# Patient Record
Sex: Male | Born: 1935 | Race: White | Hispanic: No | Marital: Married | Smoking: Former smoker
Health system: Southern US, Community
[De-identification: ages and names within clinical notes are randomized; demographics above are authoritative.]

## PROBLEM LIST (undated history)

## (undated) DIAGNOSIS — E291 Testicular hypofunction: Secondary | ICD-10-CM

## (undated) DIAGNOSIS — I10 Essential (primary) hypertension: Secondary | ICD-10-CM

## (undated) DIAGNOSIS — G4730 Sleep apnea, unspecified: Secondary | ICD-10-CM

## (undated) DIAGNOSIS — I739 Peripheral vascular disease, unspecified: Secondary | ICD-10-CM

## (undated) DIAGNOSIS — I270 Primary pulmonary hypertension: Secondary | ICD-10-CM

## (undated) DIAGNOSIS — I2510 Atherosclerotic heart disease of native coronary artery without angina pectoris: Secondary | ICD-10-CM

## (undated) DIAGNOSIS — E785 Hyperlipidemia, unspecified: Secondary | ICD-10-CM

## (undated) DIAGNOSIS — R51 Headache: Secondary | ICD-10-CM

## (undated) DIAGNOSIS — I219 Acute myocardial infarction, unspecified: Secondary | ICD-10-CM

## (undated) DIAGNOSIS — N400 Enlarged prostate without lower urinary tract symptoms: Secondary | ICD-10-CM

## (undated) DIAGNOSIS — I714 Abdominal aortic aneurysm, without rupture, unspecified: Secondary | ICD-10-CM

## (undated) DIAGNOSIS — I723 Aneurysm of iliac artery: Secondary | ICD-10-CM

## (undated) DIAGNOSIS — J8410 Pulmonary fibrosis, unspecified: Secondary | ICD-10-CM

## (undated) DIAGNOSIS — I351 Nonrheumatic aortic (valve) insufficiency: Secondary | ICD-10-CM

## (undated) DIAGNOSIS — I5022 Chronic systolic (congestive) heart failure: Secondary | ICD-10-CM

## (undated) DIAGNOSIS — J449 Chronic obstructive pulmonary disease, unspecified: Secondary | ICD-10-CM

## (undated) DIAGNOSIS — Z8719 Personal history of other diseases of the digestive system: Secondary | ICD-10-CM

## (undated) DIAGNOSIS — Z87442 Personal history of urinary calculi: Secondary | ICD-10-CM

## (undated) DIAGNOSIS — N200 Calculus of kidney: Secondary | ICD-10-CM

## (undated) DIAGNOSIS — H3320 Serous retinal detachment, unspecified eye: Secondary | ICD-10-CM

## (undated) DIAGNOSIS — D3000 Benign neoplasm of unspecified kidney: Secondary | ICD-10-CM

## (undated) DIAGNOSIS — N289 Disorder of kidney and ureter, unspecified: Secondary | ICD-10-CM

## (undated) DIAGNOSIS — R911 Solitary pulmonary nodule: Secondary | ICD-10-CM

## (undated) DIAGNOSIS — K8020 Calculus of gallbladder without cholecystitis without obstruction: Secondary | ICD-10-CM

## (undated) DIAGNOSIS — R519 Headache, unspecified: Secondary | ICD-10-CM

## (undated) HISTORY — DX: Sleep apnea, unspecified: G47.30

## (undated) HISTORY — DX: Headache, unspecified: R51.9

## (undated) HISTORY — DX: Benign prostatic hyperplasia without lower urinary tract symptoms: N40.0

## (undated) HISTORY — DX: Personal history of urinary calculi: Z87.442

## (undated) HISTORY — PX: PTCA: SHX146

## (undated) HISTORY — DX: Primary pulmonary hypertension: I27.0

## (undated) HISTORY — DX: Benign neoplasm of unspecified kidney: D30.00

## (undated) HISTORY — DX: Acute myocardial infarction, unspecified: I21.9

## (undated) HISTORY — DX: Aneurysm of iliac artery: I72.3

## (undated) HISTORY — DX: Essential (primary) hypertension: I10

## (undated) HISTORY — DX: Calculus of kidney: N20.0

## (undated) HISTORY — DX: Headache: R51

## (undated) HISTORY — PX: CATARACT EXTRACTION: SUR2

## (undated) HISTORY — DX: Abdominal aortic aneurysm, without rupture: I71.4

## (undated) HISTORY — DX: Nonrheumatic aortic (valve) insufficiency: I35.1

## (undated) HISTORY — DX: Personal history of other diseases of the digestive system: Z87.19

## (undated) HISTORY — DX: Solitary pulmonary nodule: R91.1

## (undated) HISTORY — PX: VASECTOMY: SHX75

## (undated) HISTORY — PX: ABDOMINAL AORTIC ANEURYSM REPAIR: SUR1152

## (undated) HISTORY — DX: Calculus of gallbladder without cholecystitis without obstruction: K80.20

## (undated) HISTORY — DX: Hyperlipidemia, unspecified: E78.5

## (undated) HISTORY — DX: Disorder of kidney and ureter, unspecified: N28.9

## (undated) HISTORY — DX: Serous retinal detachment, unspecified eye: H33.20

## (undated) HISTORY — DX: Chronic systolic (congestive) heart failure: I50.22

## (undated) HISTORY — DX: Abdominal aortic aneurysm, without rupture, unspecified: I71.40

## (undated) HISTORY — DX: Chronic obstructive pulmonary disease, unspecified: J44.9

## (undated) HISTORY — DX: Atherosclerotic heart disease of native coronary artery without angina pectoris: I25.10

## (undated) HISTORY — DX: Testicular hypofunction: E29.1

## (undated) HISTORY — DX: Peripheral vascular disease, unspecified: I73.9

## (undated) HISTORY — PX: EYE SURGERY: SHX253

## (undated) HISTORY — DX: Pulmonary fibrosis, unspecified: J84.10

---

## 1995-04-24 HISTORY — PX: ABDOMINAL AORTIC ANEURYSM REPAIR: SHX42

## 1998-09-10 ENCOUNTER — Inpatient Hospital Stay (HOSPITAL_COMMUNITY): Admission: EM | Admit: 1998-09-10 | Discharge: 1998-09-14 | Payer: Self-pay | Admitting: Emergency Medicine

## 1998-09-13 ENCOUNTER — Encounter: Payer: Self-pay | Admitting: *Deleted

## 1998-09-14 ENCOUNTER — Encounter: Payer: Self-pay | Admitting: Pulmonary Disease

## 1998-09-14 ENCOUNTER — Emergency Department (HOSPITAL_COMMUNITY): Admission: EM | Admit: 1998-09-14 | Discharge: 1998-09-14 | Payer: Self-pay | Admitting: Emergency Medicine

## 1998-10-11 ENCOUNTER — Encounter: Payer: Self-pay | Admitting: Emergency Medicine

## 1998-10-11 ENCOUNTER — Inpatient Hospital Stay (HOSPITAL_COMMUNITY): Admission: EM | Admit: 1998-10-11 | Discharge: 1998-10-12 | Payer: Self-pay | Admitting: Emergency Medicine

## 1998-10-12 ENCOUNTER — Encounter: Payer: Self-pay | Admitting: *Deleted

## 1998-10-27 ENCOUNTER — Encounter (HOSPITAL_COMMUNITY): Admission: RE | Admit: 1998-10-27 | Discharge: 1999-01-25 | Payer: Self-pay | Admitting: *Deleted

## 1998-12-19 ENCOUNTER — Inpatient Hospital Stay (HOSPITAL_COMMUNITY): Admission: EM | Admit: 1998-12-19 | Discharge: 1998-12-21 | Payer: Self-pay | Admitting: Internal Medicine

## 1998-12-21 ENCOUNTER — Encounter: Payer: Self-pay | Admitting: *Deleted

## 1999-01-26 ENCOUNTER — Encounter (HOSPITAL_COMMUNITY): Admission: RE | Admit: 1999-01-26 | Discharge: 1999-04-26 | Payer: Self-pay | Admitting: *Deleted

## 1999-02-04 ENCOUNTER — Ambulatory Visit: Admission: RE | Admit: 1999-02-04 | Discharge: 1999-02-04 | Payer: Self-pay | Admitting: Internal Medicine

## 1999-05-09 ENCOUNTER — Ambulatory Visit (HOSPITAL_COMMUNITY): Admission: RE | Admit: 1999-05-09 | Discharge: 1999-05-09 | Payer: Self-pay | Admitting: Internal Medicine

## 1999-09-13 ENCOUNTER — Ambulatory Visit (HOSPITAL_COMMUNITY): Admission: RE | Admit: 1999-09-13 | Discharge: 1999-09-13 | Payer: Self-pay | Admitting: Internal Medicine

## 1999-09-13 ENCOUNTER — Encounter: Payer: Self-pay | Admitting: Internal Medicine

## 1999-10-25 ENCOUNTER — Encounter: Payer: Self-pay | Admitting: Internal Medicine

## 1999-11-14 ENCOUNTER — Ambulatory Visit: Admission: RE | Admit: 1999-11-14 | Discharge: 1999-11-14 | Payer: Self-pay | Admitting: Pulmonary Disease

## 1999-12-12 ENCOUNTER — Emergency Department (HOSPITAL_COMMUNITY): Admission: EM | Admit: 1999-12-12 | Discharge: 1999-12-12 | Payer: Self-pay | Admitting: Emergency Medicine

## 1999-12-12 ENCOUNTER — Encounter: Payer: Self-pay | Admitting: *Deleted

## 2000-01-02 ENCOUNTER — Ambulatory Visit (HOSPITAL_COMMUNITY): Admission: RE | Admit: 2000-01-02 | Discharge: 2000-01-02 | Payer: Self-pay | Admitting: *Deleted

## 2000-03-07 ENCOUNTER — Observation Stay (HOSPITAL_COMMUNITY): Admission: EM | Admit: 2000-03-07 | Discharge: 2000-03-07 | Payer: Self-pay | Admitting: Emergency Medicine

## 2000-04-22 ENCOUNTER — Encounter: Admission: RE | Admit: 2000-04-22 | Discharge: 2000-07-21 | Payer: Self-pay | Admitting: Internal Medicine

## 2000-07-25 ENCOUNTER — Encounter: Payer: Self-pay | Admitting: Gastroenterology

## 2000-07-25 ENCOUNTER — Ambulatory Visit (HOSPITAL_COMMUNITY): Admission: RE | Admit: 2000-07-25 | Discharge: 2000-07-25 | Payer: Self-pay | Admitting: Gastroenterology

## 2000-11-21 ENCOUNTER — Encounter: Admission: RE | Admit: 2000-11-21 | Discharge: 2000-12-05 | Payer: Self-pay | Admitting: Internal Medicine

## 2000-12-30 ENCOUNTER — Encounter: Admission: RE | Admit: 2000-12-30 | Discharge: 2000-12-30 | Payer: Self-pay | Admitting: Family Medicine

## 2000-12-30 ENCOUNTER — Encounter: Payer: Self-pay | Admitting: Family Medicine

## 2001-01-20 ENCOUNTER — Encounter: Payer: Self-pay | Admitting: Emergency Medicine

## 2001-01-20 ENCOUNTER — Emergency Department (HOSPITAL_COMMUNITY): Admission: EM | Admit: 2001-01-20 | Discharge: 2001-01-21 | Payer: Self-pay | Admitting: Emergency Medicine

## 2001-02-06 ENCOUNTER — Ambulatory Visit (HOSPITAL_COMMUNITY): Admission: RE | Admit: 2001-02-06 | Discharge: 2001-02-06 | Payer: Self-pay | Admitting: Urology

## 2001-02-06 ENCOUNTER — Encounter: Payer: Self-pay | Admitting: Urology

## 2001-02-11 ENCOUNTER — Ambulatory Visit (HOSPITAL_COMMUNITY): Admission: RE | Admit: 2001-02-11 | Discharge: 2001-02-11 | Payer: Self-pay | Admitting: Urology

## 2001-02-11 ENCOUNTER — Encounter: Payer: Self-pay | Admitting: Urology

## 2001-04-14 ENCOUNTER — Encounter (INDEPENDENT_AMBULATORY_CARE_PROVIDER_SITE_OTHER): Payer: Self-pay | Admitting: Specialist

## 2001-04-14 ENCOUNTER — Encounter: Payer: Self-pay | Admitting: Urology

## 2001-04-14 ENCOUNTER — Inpatient Hospital Stay (HOSPITAL_COMMUNITY): Admission: RE | Admit: 2001-04-14 | Discharge: 2001-04-19 | Payer: Self-pay | Admitting: Urology

## 2001-04-16 ENCOUNTER — Encounter: Payer: Self-pay | Admitting: Urology

## 2001-04-18 ENCOUNTER — Encounter: Payer: Self-pay | Admitting: Urology

## 2001-04-23 HISTORY — PX: NEPHRECTOMY: SHX65

## 2001-05-03 ENCOUNTER — Observation Stay (HOSPITAL_COMMUNITY): Admission: EM | Admit: 2001-05-03 | Discharge: 2001-05-04 | Payer: Self-pay | Admitting: Emergency Medicine

## 2001-05-03 ENCOUNTER — Encounter: Payer: Self-pay | Admitting: Urology

## 2001-05-13 ENCOUNTER — Emergency Department (HOSPITAL_COMMUNITY): Admission: EM | Admit: 2001-05-13 | Discharge: 2001-05-13 | Payer: Self-pay

## 2001-07-09 ENCOUNTER — Encounter: Payer: Self-pay | Admitting: Internal Medicine

## 2001-07-09 ENCOUNTER — Ambulatory Visit (HOSPITAL_COMMUNITY): Admission: RE | Admit: 2001-07-09 | Discharge: 2001-07-09 | Payer: Self-pay | Admitting: Internal Medicine

## 2001-07-21 ENCOUNTER — Encounter: Payer: Self-pay | Admitting: Internal Medicine

## 2001-07-21 ENCOUNTER — Ambulatory Visit (HOSPITAL_COMMUNITY): Admission: RE | Admit: 2001-07-21 | Discharge: 2001-07-21 | Payer: Self-pay | Admitting: Internal Medicine

## 2001-09-16 ENCOUNTER — Encounter: Payer: Self-pay | Admitting: Emergency Medicine

## 2001-09-16 ENCOUNTER — Emergency Department (HOSPITAL_COMMUNITY): Admission: EM | Admit: 2001-09-16 | Discharge: 2001-09-16 | Payer: Self-pay | Admitting: Emergency Medicine

## 2001-12-29 ENCOUNTER — Encounter: Admission: RE | Admit: 2001-12-29 | Discharge: 2002-01-27 | Payer: Self-pay | Admitting: Orthopedic Surgery

## 2002-01-23 ENCOUNTER — Encounter: Payer: Self-pay | Admitting: Emergency Medicine

## 2002-01-23 ENCOUNTER — Emergency Department (HOSPITAL_COMMUNITY): Admission: EM | Admit: 2002-01-23 | Discharge: 2002-01-23 | Payer: Self-pay | Admitting: Emergency Medicine

## 2002-02-09 ENCOUNTER — Encounter: Payer: Self-pay | Admitting: Emergency Medicine

## 2002-02-09 ENCOUNTER — Emergency Department (HOSPITAL_COMMUNITY): Admission: EM | Admit: 2002-02-09 | Discharge: 2002-02-09 | Payer: Self-pay | Admitting: Emergency Medicine

## 2002-02-10 ENCOUNTER — Emergency Department (HOSPITAL_COMMUNITY): Admission: EM | Admit: 2002-02-10 | Discharge: 2002-02-11 | Payer: Self-pay | Admitting: Emergency Medicine

## 2002-07-20 ENCOUNTER — Encounter: Payer: Self-pay | Admitting: Emergency Medicine

## 2002-07-20 ENCOUNTER — Inpatient Hospital Stay (HOSPITAL_COMMUNITY): Admission: EM | Admit: 2002-07-20 | Discharge: 2002-07-23 | Payer: Self-pay

## 2002-07-22 HISTORY — PX: CARDIAC CATHETERIZATION: SHX172

## 2004-03-20 ENCOUNTER — Ambulatory Visit: Payer: Self-pay | Admitting: Internal Medicine

## 2004-04-26 ENCOUNTER — Ambulatory Visit: Payer: Self-pay | Admitting: Internal Medicine

## 2004-04-28 ENCOUNTER — Ambulatory Visit: Payer: Self-pay | Admitting: Internal Medicine

## 2004-05-03 ENCOUNTER — Ambulatory Visit: Payer: Self-pay | Admitting: Internal Medicine

## 2004-05-05 ENCOUNTER — Encounter: Admission: RE | Admit: 2004-05-05 | Discharge: 2004-05-05 | Payer: Self-pay | Admitting: Neurology

## 2004-05-12 ENCOUNTER — Ambulatory Visit: Payer: Self-pay | Admitting: Internal Medicine

## 2004-05-26 ENCOUNTER — Ambulatory Visit: Payer: Self-pay | Admitting: Internal Medicine

## 2004-06-05 ENCOUNTER — Ambulatory Visit: Payer: Self-pay | Admitting: Internal Medicine

## 2004-06-15 ENCOUNTER — Ambulatory Visit: Payer: Self-pay | Admitting: Internal Medicine

## 2004-06-19 ENCOUNTER — Ambulatory Visit: Payer: Self-pay | Admitting: Internal Medicine

## 2004-06-27 ENCOUNTER — Ambulatory Visit: Payer: Self-pay | Admitting: Internal Medicine

## 2004-07-04 ENCOUNTER — Ambulatory Visit: Payer: Self-pay | Admitting: Internal Medicine

## 2004-07-11 ENCOUNTER — Ambulatory Visit: Payer: Self-pay | Admitting: Internal Medicine

## 2004-07-17 ENCOUNTER — Ambulatory Visit: Payer: Self-pay | Admitting: Internal Medicine

## 2004-07-28 ENCOUNTER — Ambulatory Visit: Payer: Self-pay | Admitting: Internal Medicine

## 2004-08-03 ENCOUNTER — Ambulatory Visit: Payer: Self-pay | Admitting: Internal Medicine

## 2004-08-14 ENCOUNTER — Ambulatory Visit: Payer: Self-pay | Admitting: Internal Medicine

## 2004-08-15 ENCOUNTER — Ambulatory Visit: Payer: Self-pay | Admitting: *Deleted

## 2004-12-27 ENCOUNTER — Ambulatory Visit: Payer: Self-pay | Admitting: Internal Medicine

## 2005-01-15 ENCOUNTER — Ambulatory Visit: Payer: Self-pay | Admitting: Cardiology

## 2005-01-29 ENCOUNTER — Ambulatory Visit: Payer: Self-pay

## 2005-02-02 ENCOUNTER — Ambulatory Visit: Payer: Self-pay | Admitting: Internal Medicine

## 2005-02-14 ENCOUNTER — Ambulatory Visit: Payer: Self-pay | Admitting: Cardiology

## 2005-02-23 ENCOUNTER — Ambulatory Visit: Payer: Self-pay | Admitting: Internal Medicine

## 2005-03-16 ENCOUNTER — Ambulatory Visit: Payer: Self-pay | Admitting: Internal Medicine

## 2005-03-26 ENCOUNTER — Ambulatory Visit: Payer: Self-pay

## 2005-03-28 ENCOUNTER — Ambulatory Visit: Payer: Self-pay | Admitting: Internal Medicine

## 2005-05-23 ENCOUNTER — Ambulatory Visit: Payer: Self-pay | Admitting: Internal Medicine

## 2005-06-25 ENCOUNTER — Ambulatory Visit: Payer: Self-pay | Admitting: Internal Medicine

## 2005-10-19 ENCOUNTER — Ambulatory Visit: Payer: Self-pay | Admitting: Internal Medicine

## 2005-11-09 ENCOUNTER — Ambulatory Visit: Payer: Self-pay | Admitting: Cardiology

## 2005-12-11 ENCOUNTER — Ambulatory Visit: Payer: Self-pay | Admitting: Internal Medicine

## 2006-04-10 ENCOUNTER — Ambulatory Visit: Payer: Self-pay | Admitting: Internal Medicine

## 2006-04-29 ENCOUNTER — Ambulatory Visit: Payer: Self-pay | Admitting: Internal Medicine

## 2006-04-29 LAB — CONVERTED CEMR LAB
Basophils Absolute: 0 10*3/uL (ref 0.0–0.1)
Basophils Relative: 0.6 % (ref 0.0–1.0)
Eosinophil percent: 2.1 % (ref 0.0–5.0)
HCT: 44.7 % (ref 39.0–52.0)
Hemoglobin: 15.1 g/dL (ref 13.0–17.0)
Lymphocytes Relative: 24.8 % (ref 12.0–46.0)
MCHC: 33.9 g/dL (ref 30.0–36.0)
MCV: 91.5 fL (ref 78.0–100.0)
Monocytes Absolute: 0.7 10*3/uL (ref 0.2–0.7)
Monocytes Relative: 8.3 % (ref 3.0–11.0)
Neutro Abs: 5.1 10*3/uL (ref 1.4–7.7)
Neutrophils Relative %: 64.2 % (ref 43.0–77.0)
Platelets: 212 10*3/uL (ref 150–400)
RBC: 4.88 M/uL (ref 4.22–5.81)
RDW: 12.8 % (ref 11.5–14.6)
WBC: 8 10*3/uL (ref 4.5–10.5)

## 2006-07-03 ENCOUNTER — Ambulatory Visit: Payer: Self-pay | Admitting: Internal Medicine

## 2006-10-07 ENCOUNTER — Ambulatory Visit: Payer: Self-pay | Admitting: Internal Medicine

## 2006-10-08 DIAGNOSIS — Z87442 Personal history of urinary calculi: Secondary | ICD-10-CM | POA: Insufficient documentation

## 2006-10-08 DIAGNOSIS — I10 Essential (primary) hypertension: Secondary | ICD-10-CM | POA: Insufficient documentation

## 2006-10-08 DIAGNOSIS — N183 Chronic kidney disease, stage 3 unspecified: Secondary | ICD-10-CM | POA: Insufficient documentation

## 2006-10-08 HISTORY — DX: Personal history of urinary calculi: Z87.442

## 2006-10-09 DIAGNOSIS — N400 Enlarged prostate without lower urinary tract symptoms: Secondary | ICD-10-CM

## 2006-10-09 DIAGNOSIS — I2510 Atherosclerotic heart disease of native coronary artery without angina pectoris: Secondary | ICD-10-CM

## 2006-10-09 DIAGNOSIS — I739 Peripheral vascular disease, unspecified: Secondary | ICD-10-CM

## 2006-10-12 ENCOUNTER — Encounter: Admission: RE | Admit: 2006-10-12 | Discharge: 2006-10-12 | Payer: Self-pay | Admitting: Internal Medicine

## 2006-11-19 ENCOUNTER — Telehealth: Payer: Self-pay | Admitting: Internal Medicine

## 2006-11-25 ENCOUNTER — Telehealth: Payer: Self-pay | Admitting: Internal Medicine

## 2006-12-27 ENCOUNTER — Telehealth: Payer: Self-pay | Admitting: Internal Medicine

## 2007-01-16 ENCOUNTER — Telehealth: Payer: Self-pay | Admitting: Internal Medicine

## 2007-01-26 ENCOUNTER — Emergency Department (HOSPITAL_COMMUNITY): Admission: EM | Admit: 2007-01-26 | Discharge: 2007-01-26 | Payer: Self-pay | Admitting: Emergency Medicine

## 2007-01-31 ENCOUNTER — Ambulatory Visit: Payer: Self-pay | Admitting: Internal Medicine

## 2007-02-04 LAB — CONVERTED CEMR LAB: Testosterone: 241.44 ng/dL — ABNORMAL LOW (ref 350.00–890)

## 2007-02-05 ENCOUNTER — Ambulatory Visit: Payer: Self-pay | Admitting: Internal Medicine

## 2007-02-07 ENCOUNTER — Telehealth: Payer: Self-pay | Admitting: Internal Medicine

## 2007-02-07 LAB — CONVERTED CEMR LAB
FSH: 7.1 milliintl units/mL
LH: 5.6 milliintl units/mL
Prolactin: 3 ng/mL

## 2007-02-11 ENCOUNTER — Ambulatory Visit: Payer: Self-pay | Admitting: Internal Medicine

## 2007-02-11 DIAGNOSIS — E291 Testicular hypofunction: Secondary | ICD-10-CM

## 2007-02-11 HISTORY — DX: Testicular hypofunction: E29.1

## 2007-03-28 ENCOUNTER — Ambulatory Visit: Payer: Self-pay | Admitting: Internal Medicine

## 2007-04-24 DIAGNOSIS — J8410 Pulmonary fibrosis, unspecified: Secondary | ICD-10-CM

## 2007-04-24 HISTORY — DX: Pulmonary fibrosis, unspecified: J84.10

## 2007-04-28 ENCOUNTER — Telehealth: Payer: Self-pay | Admitting: Internal Medicine

## 2007-05-07 ENCOUNTER — Ambulatory Visit: Payer: Self-pay

## 2007-05-07 ENCOUNTER — Encounter: Payer: Self-pay | Admitting: Internal Medicine

## 2007-05-08 ENCOUNTER — Ambulatory Visit: Payer: Self-pay | Admitting: Internal Medicine

## 2007-05-12 ENCOUNTER — Ambulatory Visit: Payer: Self-pay | Admitting: Internal Medicine

## 2007-05-14 ENCOUNTER — Ambulatory Visit: Payer: Self-pay | Admitting: Cardiovascular Disease

## 2007-05-20 ENCOUNTER — Ambulatory Visit: Payer: Self-pay | Admitting: Cardiology

## 2007-06-02 ENCOUNTER — Ambulatory Visit: Payer: Self-pay | Admitting: Internal Medicine

## 2007-06-02 DIAGNOSIS — I270 Primary pulmonary hypertension: Secondary | ICD-10-CM

## 2007-06-02 HISTORY — DX: Primary pulmonary hypertension: I27.0

## 2007-06-09 ENCOUNTER — Ambulatory Visit: Payer: Self-pay | Admitting: Internal Medicine

## 2007-06-27 ENCOUNTER — Ambulatory Visit: Payer: Self-pay | Admitting: Internal Medicine

## 2007-07-09 ENCOUNTER — Ambulatory Visit: Payer: Self-pay | Admitting: Internal Medicine

## 2007-07-22 ENCOUNTER — Ambulatory Visit: Payer: Self-pay | Admitting: Internal Medicine

## 2007-08-25 ENCOUNTER — Ambulatory Visit: Payer: Self-pay | Admitting: Internal Medicine

## 2007-08-28 ENCOUNTER — Telehealth: Payer: Self-pay | Admitting: Internal Medicine

## 2007-09-24 ENCOUNTER — Ambulatory Visit: Payer: Self-pay | Admitting: Internal Medicine

## 2007-10-01 ENCOUNTER — Ambulatory Visit: Payer: Self-pay | Admitting: Internal Medicine

## 2007-12-15 ENCOUNTER — Telehealth: Payer: Self-pay | Admitting: Internal Medicine

## 2007-12-24 ENCOUNTER — Ambulatory Visit: Payer: Self-pay | Admitting: Internal Medicine

## 2007-12-24 LAB — CONVERTED CEMR LAB
Glucose, Urine, Semiquant: NEGATIVE
Specific Gravity, Urine: 1.015
pH: 5.5

## 2007-12-26 ENCOUNTER — Telehealth: Payer: Self-pay | Admitting: Internal Medicine

## 2008-01-01 ENCOUNTER — Telehealth: Payer: Self-pay | Admitting: Internal Medicine

## 2008-01-16 ENCOUNTER — Telehealth (INDEPENDENT_AMBULATORY_CARE_PROVIDER_SITE_OTHER): Payer: Self-pay | Admitting: *Deleted

## 2008-01-22 ENCOUNTER — Ambulatory Visit: Payer: Self-pay | Admitting: Internal Medicine

## 2008-01-22 LAB — CONVERTED CEMR LAB
Nitrite: NEGATIVE
Specific Gravity, Urine: 1.01
Urobilinogen, UA: 0.2

## 2008-01-23 ENCOUNTER — Encounter: Payer: Self-pay | Admitting: Internal Medicine

## 2008-01-26 ENCOUNTER — Telehealth: Payer: Self-pay | Admitting: Internal Medicine

## 2008-01-26 LAB — CONVERTED CEMR LAB
ALT: 21 units/L (ref 0–53)
Amylase: 55 units/L (ref 27–131)
Basophils Absolute: 0 10*3/uL (ref 0.0–0.1)
Bilirubin, Direct: 0.1 mg/dL (ref 0.0–0.3)
CO2: 30 meq/L (ref 19–32)
Calcium: 8.9 mg/dL (ref 8.4–10.5)
Creatinine, Ser: 1.3 mg/dL (ref 0.4–1.5)
Eosinophils Absolute: 0.3 10*3/uL (ref 0.0–0.7)
GFR calc non Af Amer: 57 mL/min
Lymphocytes Relative: 26.5 % (ref 12.0–46.0)
MCHC: 35.5 g/dL (ref 30.0–36.0)
MCV: 89 fL (ref 78.0–100.0)
Neutro Abs: 4 10*3/uL (ref 1.4–7.7)
Neutrophils Relative %: 60.7 % (ref 43.0–77.0)
RDW: 13.2 % (ref 11.5–14.6)
Sodium: 145 meq/L (ref 135–145)
Total Bilirubin: 1 mg/dL (ref 0.3–1.2)

## 2008-02-12 ENCOUNTER — Telehealth: Payer: Self-pay | Admitting: Internal Medicine

## 2008-02-13 ENCOUNTER — Ambulatory Visit: Payer: Self-pay | Admitting: Internal Medicine

## 2008-02-13 LAB — CONVERTED CEMR LAB
Bilirubin Urine: NEGATIVE
Blood in Urine, dipstick: NEGATIVE
Glucose, Urine, Semiquant: NEGATIVE
Ketones, urine, test strip: NEGATIVE
Protein, U semiquant: NEGATIVE
Specific Gravity, Urine: 1.02
pH: 5.5

## 2008-02-14 ENCOUNTER — Encounter: Payer: Self-pay | Admitting: Internal Medicine

## 2008-03-16 ENCOUNTER — Ambulatory Visit: Payer: Self-pay | Admitting: Internal Medicine

## 2008-03-16 LAB — CONVERTED CEMR LAB
Bilirubin Urine: NEGATIVE
Blood in Urine, dipstick: NEGATIVE
Glucose, Urine, Semiquant: NEGATIVE
Ketones, urine, test strip: NEGATIVE

## 2008-03-17 ENCOUNTER — Encounter: Payer: Self-pay | Admitting: Internal Medicine

## 2008-03-31 ENCOUNTER — Ambulatory Visit: Payer: Self-pay | Admitting: Family Medicine

## 2008-04-02 ENCOUNTER — Telehealth: Payer: Self-pay | Admitting: Internal Medicine

## 2008-04-14 ENCOUNTER — Ambulatory Visit: Payer: Self-pay | Admitting: Internal Medicine

## 2008-05-31 ENCOUNTER — Ambulatory Visit: Payer: Self-pay | Admitting: Cardiology

## 2008-06-09 ENCOUNTER — Ambulatory Visit: Payer: Self-pay

## 2008-07-21 ENCOUNTER — Ambulatory Visit: Payer: Self-pay | Admitting: Family Medicine

## 2008-10-28 ENCOUNTER — Ambulatory Visit: Payer: Self-pay | Admitting: Internal Medicine

## 2008-11-25 ENCOUNTER — Ambulatory Visit: Payer: Self-pay | Admitting: Family Medicine

## 2008-12-28 ENCOUNTER — Ambulatory Visit: Payer: Self-pay | Admitting: Family Medicine

## 2009-01-05 ENCOUNTER — Emergency Department (HOSPITAL_COMMUNITY): Admission: EM | Admit: 2009-01-05 | Discharge: 2009-01-05 | Payer: Self-pay | Admitting: Emergency Medicine

## 2009-01-25 ENCOUNTER — Encounter: Payer: Self-pay | Admitting: Internal Medicine

## 2009-02-04 ENCOUNTER — Encounter: Payer: Self-pay | Admitting: Internal Medicine

## 2009-03-21 ENCOUNTER — Ambulatory Visit: Payer: Self-pay | Admitting: Internal Medicine

## 2009-03-23 LAB — CONVERTED CEMR LAB
ALT: 18 units/L (ref 0–53)
BUN: 19 mg/dL (ref 6–23)
Bilirubin, Direct: 0.1 mg/dL (ref 0.0–0.3)
CO2: 25 meq/L (ref 19–32)
Eosinophils Relative: 1.9 % (ref 0.0–5.0)
GFR calc non Af Amer: 52.32 mL/min (ref >60)
Glucose, Bld: 89 mg/dL (ref 70–99)
HDL: 27.7 mg/dL — ABNORMAL LOW (ref >39.00)
MCV: 92.5 fL (ref 78.0–100.0)
Monocytes Absolute: 0.6 10*3/uL (ref 0.1–1.0)
Monocytes Relative: 9.8 % (ref 3.0–12.0)
Neutrophils Relative %: 61 % (ref 43.0–77.0)
Platelets: 162 10*3/uL (ref 150.0–400.0)
Potassium: 4.8 meq/L (ref 3.5–5.1)
Total Bilirubin: 1 mg/dL (ref 0.3–1.2)
Total CHOL/HDL Ratio: 6
VLDL: 22.6 mg/dL (ref 0.0–40.0)
WBC: 6.4 10*3/uL (ref 4.5–10.5)

## 2009-03-30 ENCOUNTER — Encounter (INDEPENDENT_AMBULATORY_CARE_PROVIDER_SITE_OTHER): Payer: Self-pay

## 2009-03-30 ENCOUNTER — Encounter (INDEPENDENT_AMBULATORY_CARE_PROVIDER_SITE_OTHER): Payer: Self-pay | Admitting: *Deleted

## 2009-04-01 ENCOUNTER — Ambulatory Visit: Payer: Self-pay | Admitting: Gastroenterology

## 2009-04-26 ENCOUNTER — Ambulatory Visit: Payer: Self-pay | Admitting: Gastroenterology

## 2009-04-28 ENCOUNTER — Encounter: Payer: Self-pay | Admitting: Gastroenterology

## 2009-05-23 ENCOUNTER — Ambulatory Visit: Payer: Self-pay | Admitting: Internal Medicine

## 2009-05-23 LAB — CONVERTED CEMR LAB
Bilirubin, Direct: 0.2 mg/dL (ref 0.0–0.3)
LDL Cholesterol: 53 mg/dL (ref 0–99)
Total Bilirubin: 0.6 mg/dL (ref 0.3–1.2)
Total CHOL/HDL Ratio: 3
VLDL: 10.6 mg/dL (ref 0.0–40.0)

## 2009-05-26 ENCOUNTER — Emergency Department (HOSPITAL_COMMUNITY): Admission: EM | Admit: 2009-05-26 | Discharge: 2009-05-26 | Payer: Self-pay | Admitting: Emergency Medicine

## 2009-06-03 ENCOUNTER — Telehealth: Payer: Self-pay | Admitting: Internal Medicine

## 2009-07-07 ENCOUNTER — Telehealth: Payer: Self-pay | Admitting: Internal Medicine

## 2009-09-05 ENCOUNTER — Ambulatory Visit: Payer: Self-pay | Admitting: Internal Medicine

## 2009-09-20 ENCOUNTER — Telehealth: Payer: Self-pay | Admitting: Internal Medicine

## 2009-09-20 ENCOUNTER — Ambulatory Visit: Payer: Self-pay | Admitting: Internal Medicine

## 2009-10-11 ENCOUNTER — Ambulatory Visit: Payer: Self-pay | Admitting: Internal Medicine

## 2009-11-07 ENCOUNTER — Ambulatory Visit: Payer: Self-pay | Admitting: Internal Medicine

## 2009-12-12 ENCOUNTER — Ambulatory Visit: Payer: Self-pay | Admitting: Internal Medicine

## 2009-12-12 DIAGNOSIS — F528 Other sexual dysfunction not due to a substance or known physiological condition: Secondary | ICD-10-CM

## 2009-12-20 ENCOUNTER — Ambulatory Visit: Payer: Self-pay | Admitting: Cardiology

## 2009-12-20 DIAGNOSIS — I447 Left bundle-branch block, unspecified: Secondary | ICD-10-CM | POA: Insufficient documentation

## 2009-12-27 ENCOUNTER — Telehealth: Payer: Self-pay | Admitting: Internal Medicine

## 2010-02-06 ENCOUNTER — Ambulatory Visit: Payer: Self-pay | Admitting: Internal Medicine

## 2010-02-07 LAB — CONVERTED CEMR LAB
Albumin: 3.6 g/dL (ref 3.5–5.2)
CO2: 22 meq/L (ref 19–32)
Chloride: 112 meq/L (ref 96–112)
HDL: 26.4 mg/dL — ABNORMAL LOW (ref >39.00)
LDL Cholesterol: 107 mg/dL — ABNORMAL HIGH (ref 0–99)
Sodium: 142 meq/L (ref 135–145)
Total CHOL/HDL Ratio: 6
Total Protein: 5.9 g/dL — ABNORMAL LOW (ref 6.0–8.3)
Triglycerides: 154 mg/dL — ABNORMAL HIGH (ref 0.0–149.0)
VLDL: 30.8 mg/dL (ref 0.0–40.0)

## 2010-02-27 ENCOUNTER — Encounter: Payer: Self-pay | Admitting: Internal Medicine

## 2010-05-14 ENCOUNTER — Encounter: Payer: Self-pay | Admitting: Internal Medicine

## 2010-05-23 NOTE — Assessment & Plan Note (Signed)
Summary: consult re: bowell issue and testosterone inj/cjr   Vital Signs:  Patient profile:   77 year old male Weight:      200 pounds BMI:     28.00 Temp:     98.7 degrees F oral Pulse rate:   72 / minute Pulse rhythm:   regular Resp:     12 per minute BP sitting:   132 / 66  (left arm) Cuff size:   regular  Vitals Entered By: Gladis Riffle, RN (Sep 05, 2009 9:35 AM) CC: discuss bowel issues,  Is Patient Diabetic? No   Primary Care Provider:  Birdie Sons MD  CC:  discuss bowel issues and .  History of Present Illness: c/o GI sxs reviewed GI procedures he states he has a lot of gas, "lots of rumbling" and few BMs. says BMs are soft, BMs are 3 times daily  to every other day. Denies abdominal pain. He feels well. no complaints appetite is good . he wonders ("where is all of that going"). He thinks there is a discrepancy between the amount of food he injests and quantity of BMs  pt describes shoulder pain---long term, intermittent use of hydrocodone (ok to use as needed, can contribute to constipaiton)  All other systems reviewed and were negative   Preventive Screening-Counseling & Management  Alcohol-Tobacco     Smoking Status: quit > 6 months     Year Started: 1949     Year Quit: 1999     Pack years: 50     Passive Smoke Exposure: no  Current Problems (verified): 1)  Constipation  (ICD-564.00) 2)  Herpes Zoster  (ICD-053.9) 3)  Pulmonary Hypertension  (ICD-416.0) 4)  Pulmonary Fibrosis  (ICD-515) 5)  C O P D  (ICD-496) 6)  Pulmonary Nodule  (ICD-518.89) 7)  Testosterone Deficiency  (ICD-257.2) 8)  Coronary Artery Disease  (ICD-414.00) 9)  Benign Prostatic Hypertrophy  (ICD-600.00) 10)  Peripheral Vascular Disease  (ICD-443.9) 11)  Renal Insufficiency  (ICD-588.9) 12)  Nephrolithiasis, Hx of  (ICD-V13.01) 13)  Hypertension  (ICD-401.9)  Current Medications (verified): 1)  Zolpidem Tartrate 5 Mg Tabs (Zolpidem Tartrate) .... Take 1 Tablet By Mouth At  Bedtime 2)  Aspir-81 81 Mg Tbec (Aspirin) .... Take 1 Tablet By Mouth Once A Day 3)  Ibuprofen 800 Mg Tabs (Ibuprofen) .... Take 1 Tablet By Mouth Four Times A Dayprn 4)  Lisinopril 20 Mg Tabs (Lisinopril) .... Take 1 Tablet By Mouth 5)  Omeprazole 20 Mg Cpdr (Omeprazole) .... .bidtab Y 6)  Proscar 5 Mg  Tabs (Finasteride) .Marland Kitchen.. 1 By Mouth Once Daily 7)  Depo-Testosterone 200 Mg/ml  Oil (Testosterone Cypionate) .Marland Kitchen.. 1 Cc Im Every Month- To Be Done At Physician's Office 8)  Doxazosin Mesylate 8 Mg Tabs (Doxazosin Mesylate) .Marland Kitchen.. 1 By Mouth Once Daily 9)  Lorazepam 1 Mg  Tabs (Lorazepam) .... Take 1 Tablet By Mouth Once A Day As Needed  Limit To #20 Per 30 Days 10)  Spiriva Handihaler 18 Mcg  Caps (Tiotropium Bromide Monohydrate) .... Inhale 1 Capsule Once Daily 11)  Cialis 20 Mg Tabs (Tadalafil) .... Take One As Directed 12)  Topamax 25 Mg Tabs (Topiramate) .... 3 By Mouth Daily 13)  Miralax   Powd (Polyethylene Glycol 3350) .Marland Kitchen.. 17g By Mouth Once Daily As Needed Constipation 14)  Senokot 8.6 Mg Tabs (Sennosides) .... Use Three Times A Day Hold For Loose Stools. 15)  Simvastatin 40 Mg Tabs (Simvastatin) .... Take 1 Tablet By Mouth At Bedtime  Allergies:  1)  ! Beta Blockers  Past History:  Past Medical History: Last updated: 06/02/2007 benign kidney tumor chronic headache  (neuro Hypertension Nephrolithiasis, hx of Renal insufficiency Peripheral vascular disease Benign prostatic hypertrophy Coronary artery disease COPD - NOS. Per hx was dxed by Dr Cato Mulligan in 2006 on clinical grounds. Also seen on Ct chest jan 2009 detached retina Asthma Allergic Rhinitis Sleep apnea in 2001 per hx. resolved after he stopped beta blockers  Past Surgical History: Last updated: 05/12/2007 Nephrectomy, partial (benighn tumor) Vasectomy Percutaneous transluminal coronary angioplasty  1. Abdominal aortic aneurysm repair in 1997.  2. 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  3.  Status post PTCA in 2001 and 2002 as noted.  4. He had bilateral cataract surgery in the past.  Family History: Last updated: 05/12/2007 None declareed  Social History: Last updated: 05/12/2007 Occupation:Works at [employer] Former Smoker Regular exercise-yes Chlorine expoisure present x 35 years  Risk Factors: Exercise: yes (01/31/2007)  Risk Factors: Smoking Status: quit > 6 months (09/05/2009) Passive Smoke Exposure: no (09/05/2009)  Physical Exam  General:  alert and well-developed.   Head:  normocephalic and atraumatic.   Neck:  supple and full ROM.   Chest Wall:  No deformities, masses, tenderness or gynecomastia noted. Heart:  normal rate and regular rhythm.   Abdomen:  soft and non-tender.   Msk:  No deformity or scoliosis noted of thoracic or lumbar spine.   Neurologic:  alert & oriented X3 and cranial nerves II-XII intact.     Impression & Recommendations:  Problem # 1:  CONSTIPATION (ICD-564.00) i do not think this is a significant medical problem check xray and then make a determination His updated medication list for this problem includes:    Miralax Powd (Polyethylene glycol 3350) .Marland KitchenMarland KitchenMarland KitchenMarland Kitchen 17g by mouth once daily as needed constipation    Senokot 8.6 Mg Tabs (Sennosides) ..... Use three times a day hold for loose stools.  Orders: T-Abdomen 2-view (74020TC)  Complete Medication List: 1)  Zolpidem Tartrate 5 Mg Tabs (Zolpidem tartrate) .... Take 1 tablet by mouth at bedtime 2)  Aspir-81 81 Mg Tbec (Aspirin) .... Take 1 tablet by mouth once a day 3)  Ibuprofen 800 Mg Tabs (Ibuprofen) .... Take 1 tablet by mouth four times a dayprn 4)  Lisinopril 20 Mg Tabs (Lisinopril) .... Take 1 tablet by mouth 5)  Omeprazole 20 Mg Cpdr (Omeprazole) .... .bidtab y 6)  Proscar 5 Mg Tabs (Finasteride) .Marland Kitchen.. 1 by mouth once daily 7)  Depo-testosterone 200 Mg/ml Oil (Testosterone cypionate) .Marland Kitchen.. 1 cc im every month- to be done at physician's office 8)  Doxazosin  Mesylate 8 Mg Tabs (Doxazosin mesylate) .Marland Kitchen.. 1 by mouth once daily 9)  Lorazepam 1 Mg Tabs (Lorazepam) .... Take 1 tablet by mouth once a day as needed  limit to #20 per 30 days 10)  Spiriva Handihaler 18 Mcg Caps (Tiotropium bromide monohydrate) .... Inhale 1 capsule once daily 11)  Cialis 20 Mg Tabs (Tadalafil) .... Take one as directed 12)  Topamax 25 Mg Tabs (Topiramate) .... 3 by mouth daily 13)  Miralax Powd (Polyethylene glycol 3350) .Marland Kitchen.. 17g by mouth once daily as needed constipation 14)  Senokot 8.6 Mg Tabs (Sennosides) .... Use three times a day hold for loose stools. 15)  Simvastatin 40 Mg Tabs (Simvastatin) .... Take 1 tablet by mouth at bedtime 16)  Hydrocodone-acetaminophen 5-325 Mg Tabs (Hydrocodone-acetaminophen) .... Take 1 tablet by mouth once a day as needed pain Prescriptions: HYDROCODONE-ACETAMINOPHEN 5-325  MG TABS (HYDROCODONE-ACETAMINOPHEN) Take 1 tablet by mouth once a day as needed pain  #20 x 1   Entered and Authorized by:   Birdie Sons MD   Signed by:   Birdie Sons MD on 09/05/2009   Method used:   Print then Give to Patient   RxID:   [id number] DEPO-TESTOSTERONE 200 MG/ML  OIL (TESTOSTERONE CYPIONATE) 1 cc IM every month- to be done at physician's office  #1 vial x 2   Entered by:   Gladis Riffle, RN   Authorized by:   Birdie Sons MD   Signed by:   Gladis Riffle, RN on 09/05/2009   Method used:   Printed then faxed to ...       [facility] (mail-order)             , [place]         Ph: [phone]       Fax: [phone]   RxID:   [id number]

## 2010-05-23 NOTE — Procedures (Signed)
Summary: Colonoscopy  Patient: [name] Note: All result statuses are Final unless otherwise noted.  Tests: (1) Colonoscopy (COL)   COL Colonoscopy           DONE     [facility]     [address]     [place], [place]  [address]           COLONOSCOPY PROCEDURE REPORT           PATIENT:  [name], [name]  MR#:  [id number]     BIRTHDATE:  10-21-1930, 78 yrs. old  GENDER:  male           ENDOSCOPIST:  Barbette Hair. Arlyce Dice, MD     Referred by:           PROCEDURE DATE:  04/26/2009     PROCEDURE:  Colon with cold biopsy polypectomy     ASA CLASS:  Class II     INDICATIONS:  history of pre-cancerous (adenomatous) colon polyps                 MEDICATIONS:   Fentanyl 50 mcg IV, Versed 4 mg IV           DESCRIPTION OF PROCEDURE:   After the risks benefits and     alternatives of the procedure were thoroughly explained, informed     consent was obtained.  Digital rectal exam was performed and     revealed no abnormalities.   The LB [id number] endoscope     was introduced through the anus and advanced to the cecum, which     was identified by the ileocecal valve, limited by poor     preparation.  Limited examination due to moderate amount of     retained, liquid stool  The quality of the prep was Moviprep fair.     The instrument was then slowly withdrawn as the colon was fully     examined.     <<PROCEDUREIMAGES>>           FINDINGS:  Two polyps were found in the mid transverse colon (see     image6). 2 1-36mm polyps - removed with cold bx forceps  Moderate     diverticulosis was found in the sigmoid colon (see image2).  This     was otherwise a normal examination of the colon (see image3,     image4, image5, image9, image10, image11, and image12).     Retroflexed views in the rectum revealed no abnormalities.    The     scope was then withdrawn from the patient and the procedure     completed.           COMPLICATIONS:  None           ENDOSCOPIC IMPRESSION:     1) Two  polyps in the mid transverse colon     2) Moderate diverticulosis in the sigmoid colon     3) Otherwise normal examination     RECOMMENDATIONS:     1) colonoscopy in 3 years due to limitations of exam (related to     prep)           REPEAT EXAM:  In 3 year(s) for Colonoscopy.           ______________________________     Barbette Hair. Arlyce Dice, MD           CC:  Lindley Magnus, MD           n.  eSIGNED:   Barbette Hair. Maxemiliano Riel at 04/26/2009 09:50 AM           [name], [id number]  Note: An exclamation mark (!) indicates a result that was not dispersed into the flowsheet. Document Creation Date: 04/26/2009 9:50 AM _______________________________________________________________________  (1) Order result status: Final Collection or observation date-time: 04/26/2009 09:39 Requested date-time:  Receipt date-time:  Reported date-time:  Referring Physician:   Ordering Physician: Melvia Heaps [phone]) Specimen Source:  Source: Launa Grill Order Number: [id number] Lab site:   Appended Document: Colonoscopy     Procedures Next Due Date:    Colonoscopy: 04/2012

## 2010-05-23 NOTE — Progress Notes (Signed)
Summary: lorazepam refill  Phone Note From Pharmacy   [facility]  [place] [address] Summary of Call: patient is requesting a refill of lorazepam is this okay to fill? Initial call taken by: Kern Reap CMA Duncan Dull),  December 27, 2009 4:15 PM  Follow-up for Phone Call        ok x one Follow-up by: Birdie Sons MD,  December 28, 2009 12:15 PM    Prescriptions: LORAZEPAM 1 MG  TABS (LORAZEPAM) Take 1 tablet by mouth once a day as needed  Limit to #20 per 30 days  #20 x 1   Entered by:   Lucious Groves CMA   Authorized by:   Birdie Sons MD   Signed by:   Lucious Groves CMA on 12/28/2009   Method used:   Telephoned to ...       [facility]  [place] [address] (retail)       [address]       [place], [place]  [address]       Ph: [phone] or [phone]       Fax: [phone]   RxID:   [id number]

## 2010-05-23 NOTE — Assessment & Plan Note (Signed)
Summary: testosterine inj//ccm pt rsc/njr appt 130pm   Nurse Visit   Allergies: 1)  ! Beta Blockers  Medication Administration  Injection # 1:    Medication: Testosterone Cypionat 200mg  ing    Diagnosis: TESTOSTERONE DEFICIENCY (ICD-257.2)    Route: IM    Site: LUOQ gluteus    Exp Date: 06/21/2012    Lot #: [id number]    Mfr: pfizer    Patient tolerated injection without complications    Given by: Gladis Riffle, RN (November 07, 2009 1:43 PM)  Orders Added: 1)  Admin of patients own med IM/SQ [[id number]]   Medication Administration  Injection # 1:    Medication: Testosterone Cypionat 200mg  ing    Diagnosis: TESTOSTERONE DEFICIENCY (ICD-257.2)    Route: IM    Site: LUOQ gluteus    Exp Date: 06/21/2012    Lot #: [id number]    Mfr: pfizer    Patient tolerated injection without complications    Given by: Gladis Riffle, RN (November 07, 2009 1:43 PM)  Orders Added: 1)  Admin of patients own med IM/SQ [phone]

## 2010-05-23 NOTE — Progress Notes (Signed)
Summary: return call  Phone Note Call from Patient   Summary of Call: Returning call to Dr. Cato Mulligan' nurse.  Tomorrow c [phone] best or  [phone] Initial call taken by: Rudy Jew, RN,  Sep 20, 2009 5:15 PM  Follow-up for Phone Call        unable to reach pt.Left message on machine. Pt to call back.  Follow-up by: Gladis Riffle, RN,  September 21, 2009 3:20 PM  Additional Follow-up for Phone Call Additional follow up Details #1::        Patient notified. of xray results.  see append. Additional Follow-up by: Gladis Riffle, RN,  September 22, 2009 1:01 PM

## 2010-05-23 NOTE — Assessment & Plan Note (Signed)
Summary: rov/ gd  Medications Added NITROSTAT 0.4 MG SUBL (NITROGLYCERIN) 1 tablet under tongue at onset of chest pain; you may repeat every 5 minutes for up to 3 doses.        Visit Type:  rov Referring Provider:  Dr. Birdie Sons Primary Provider:  Birdie Sons MD  CC:  no cardiac complaints today.  History of Present Illness: Mr. [name] returns today for evaluation management of his cardiac disease.  I last evaluated him about a year and a half ago. He has had no symptoms of angina or ischemia. He does have chronic dyspnea on exertion which is unchanged from his COPD.  He was advised to have an abdominal ultrasound last visit. I do not see a result. His previous abdominal aortic aneurysm repair.  He denies any syncope or presyncope. EKG today shows a left bundle branch block which is new  Current Medications (verified): 1)  Zolpidem Tartrate 5 Mg Tabs (Zolpidem Tartrate) .... Take 1 Tablet By Mouth At Bedtime 2)  Aspir-81 81 Mg Tbec (Aspirin) .... Take 1 Tablet By Mouth Once A Day 3)  Ibuprofen 800 Mg Tabs (Ibuprofen) .... Take 1 Tablet By Mouth Four Times A Dayprn 4)  Lisinopril 20 Mg Tabs (Lisinopril) .... Take 1 Tablet By Mouth 5)  Omeprazole 20 Mg Cpdr (Omeprazole) .... .bidtab Y 6)  Proscar 5 Mg  Tabs (Finasteride) .Marland Kitchen.. 1 By Mouth Once Daily 7)  Depo-Testosterone 200 Mg/ml  Oil (Testosterone Cypionate) .Marland Kitchen.. 1 Cc Im Every Month- To Be Done At Physician's Office 8)  Doxazosin Mesylate 8 Mg Tabs (Doxazosin Mesylate) .Marland Kitchen.. 1 By Mouth Once Daily 9)  Lorazepam 1 Mg  Tabs (Lorazepam) .... Take 1 Tablet By Mouth Once A Day As Needed  Limit To #20 Per 30 Days 10)  Spiriva Handihaler 18 Mcg  Caps (Tiotropium Bromide Monohydrate) .... Inhale 1 Capsule Once Daily 11)  Topamax 25 Mg Tabs (Topiramate) .... 3 By Mouth Daily 12)  Miralax   Powd (Polyethylene Glycol 3350) .Marland Kitchen.. 17g By Mouth Once Daily As Needed Constipation 13)  Senokot 8.6 Mg Tabs (Sennosides) .... Use Three Times A Day Hold  For Loose Stools. 14)  Hydrocodone-Acetaminophen 5-325 Mg Tabs (Hydrocodone-Acetaminophen) .... Take 1 Tablet By Mouth Once A Day As Needed Pain 15)  Robitussin Dm 100-10 Mg/27ml Syrp (Dextromethorphan-Guaifenesin) 16)  Nitrostat 0.4 Mg Subl (Nitroglycerin) .Marland Kitchen.. 1 Tablet Under Tongue At Onset of Chest Pain; You May Repeat Every 5 Minutes For Up To 3 Doses.  Allergies: 1)  ! Beta Blockers  Past History:  Past Medical History: Last updated: 06/02/2007 benign kidney tumor chronic headache  (neuro Hypertension Nephrolithiasis, hx of Renal insufficiency Peripheral vascular disease Benign prostatic hypertrophy Coronary artery disease COPD - NOS. Per hx was dxed by Dr Cato Mulligan in 2006 on clinical grounds. Also seen on Ct chest jan 2009 detached retina Asthma Allergic Rhinitis Sleep apnea in 2001 per hx. resolved after he stopped beta blockers  Past Surgical History: Last updated: 05/12/2007 Nephrectomy, partial (benighn tumor) Vasectomy Percutaneous transluminal coronary angioplasty  1. Abdominal aortic aneurysm repair in 1997.  2. 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  3. Status post PTCA in 2001 and 2002 as noted.  4. He had bilateral cataract surgery in the past.  Family History: Last updated: 12/09/2009 Family History of Hypertension:  Family History of Renal Disease:  Family History of Kidney stones  Social History: Last updated: 05/12/2007 Occupation:Works at [employer] Former Smoker Regular exercise-yes Chlorine expoisure present x  35 years  Risk Factors: Exercise: yes (01/31/2007)  Risk Factors: Smoking Status: quit > 6 months (12/12/2009) Passive Smoke Exposure: no (12/12/2009)  Review of Systems       negative other than history of present illness  Vital Signs:  Patient profile:   77 year old male Height:      71 inches Weight:      198.4 pounds BMI:     27.77 Pulse rate:   66 / minute Pulse rhythm:   irregular BP  sitting:   128 / 80  (left arm) Cuff size:   large  Vitals Entered By: Danielle Rankin, CMA (December 20, 2009 3:05 PM)  Physical Exam  General:  elderly, looks older than stated age, in no acute distress Head:  normocephalic and atraumatic Eyes:  PERRLA/EOM intact; conjunctiva and lids normal. Neck:  Neck supple, no JVD. No masses, thyromegaly or abnormal cervical nodes. Chest Wall:  no deformities or breast masses noted Lungs:  decreased breath sounds throughout with a few dry rales in the bases Heart:  PMI poorly appreciated, soft S1-S2, no gallop or rub or murmur. Abdomen:  soft, no tenderness, good bowel sounds. No obvious bruit Msk:  decreased ROM.   Pulses:  pulses normal in all 4 extremities Extremities:  No clubbing or cyanosis. Neurologic:  Alert and oriented x 3. Skin:  Intact without lesions or rashes. Psych:  Normal affect.   Problems:  Medical Problems Added: 1)  Dx of Lbbb  (ICD-426.3)  EKG  Procedure date:  12/20/2009  Findings:      normal sinus rhythm, first-degree AV block, left bundle branch block.  Impression & Recommendations:  Problem # 1:  PULMONARY HYPERTENSION (ICD-416.0) Assessment Unchanged  Problem # 2:  PULMONARY FIBROSIS (ICD-515) Assessment: Unchanged  Problem # 3:  CORONARY ARTERY DISEASE (ICD-414.00) Assessment: Unchanged  His updated medication list for this problem includes:    Aspir-81 81 Mg Tbec (Aspirin) .Marland Kitchen... Take 1 tablet by mouth once a day    Lisinopril 20 Mg Tabs (Lisinopril) .Marland Kitchen... Take 1 tablet by mouth    Nitrostat 0.4 Mg Subl (Nitroglycerin) .Marland Kitchen... 1 tablet under tongue at onset of chest pain; you may repeat every 5 minutes for up to 3 doses.  Orders: EKG w/ Interpretation (93[address])  Problem # 4:  PERIPHERAL VASCULAR DISEASE (ICD-443.9) Assessment: Unchanged  Problem # 5:  HYPERTENSION (ICD-401.9) Assessment: Improved  His updated medication list for this problem includes:    Aspir-81 81 Mg Tbec (Aspirin) .Marland Kitchen... Take 1  tablet by mouth once a day    Lisinopril 20 Mg Tabs (Lisinopril) .Marland Kitchen... Take 1 tablet by mouth    Doxazosin Mesylate 8 Mg Tabs (Doxazosin mesylate) .Marland Kitchen... 1 by mouth once daily  Problem # 6:  RENAL INSUFFICIENCY (ICD-588.9)  Problem # 7:  LBBB (ICD-426.3) Assessment: New patient advised that if he has any lightheadedness or syncope to advise [place]. His updated medication list for this problem includes:    Aspir-81 81 Mg Tbec (Aspirin) .Marland Kitchen... Take 1 tablet by mouth once a day    Lisinopril 20 Mg Tabs (Lisinopril) .Marland Kitchen... Take 1 tablet by mouth    Nitrostat 0.4 Mg Subl (Nitroglycerin) .Marland Kitchen... 1 tablet under tongue at onset of chest pain; you may repeat every 5 minutes for up to 3 doses.  Orders: EKG w/ Interpretation ([address])  His updated medication list for this problem includes:    Aspir-81 81 Mg Tbec (Aspirin) .Marland Kitchen... Take 1 tablet by mouth once a day    Lisinopril  20 Mg Tabs (Lisinopril) .Marland Kitchen... Take 1 tablet by mouth    Nitrostat 0.4 Mg Subl (Nitroglycerin) .Marland Kitchen... 1 tablet under tongue at onset of chest pain; you may repeat every 5 minutes for up to 3 doses.  Patient Instructions: 1)  Your physician recommends that you schedule a follow-up appointment in: 1 year with Dr. Daleen Squibb 2)  Your physician recommends that you continue on your current medications as directed. Please refer to the Current Medication list given to you today. Prescriptions: NITROSTAT 0.4 MG SUBL (NITROGLYCERIN) 1 tablet under tongue at onset of chest pain; you may repeat every 5 minutes for up to 3 doses.  #25 x 6   Entered by:   Danielle Rankin, CMA   Authorized by:   Gaylord Shih, MD, [facility]   Signed by:   Danielle Rankin, CMA on 12/20/2009   Method used:   [facility]        [facility]  [place] [address]       [address]       [place], [place]  [address]       Ph: [phone] or [phone]       Fax: [phone]   RxID:   [id number]

## 2010-05-23 NOTE — Assessment & Plan Note (Signed)
Summary: TESTOS INJ/RCD/RESCHED/CB   Nurse Visit   Allergies: 1)  ! Beta Blockers  Medication Administration  Injection # 1:    Medication: Testosterone Cypionat 200mg  ing    Diagnosis: TESTOSTERONE DEFICIENCY (ICD-257.2)    Route: IM    Site: RUOQ gluteus    Exp Date: 04/23/2012    Lot #: [id number]    Mfr: pfizer    Patient tolerated injection without complications    Given by: Gladis Riffle, RN (October 11, 2009 2:02 PM)  Orders Added: 1)  Admin of patients own med IM/SQ [[id number]] Prescriptions: DEPO-TESTOSTERONE 200 MG/ML  OIL (TESTOSTERONE CYPIONATE) 1 cc IM every month- to be done at physician's office  #10cc vial x 2   Entered by:   Gladis Riffle, RN   Authorized by:   Birdie Sons MD   Signed by:   Gladis Riffle, RN on 10/11/2009   Method used:   Printed then faxed to ...       [facility] (mail-order)             , [place]         Ph: [phone]       Fax: [phone]   RxID:   [id number]    Medication Administration  Injection # 1:    Medication: Testosterone Cypionat 200mg  ing    Diagnosis: TESTOSTERONE DEFICIENCY (ICD-257.2)    Route: IM    Site: RUOQ gluteus    Exp Date: 04/23/2012    Lot #: [id number]    Mfr: pfizer    Patient tolerated injection without complications    Given by: Gladis Riffle, RN (October 11, 2009 2:02 PM)  Orders Added: 1)  Admin of patients own med IM/SQ [phone]

## 2010-05-23 NOTE — Letter (Signed)
Summary: Patient Notice- Polyp Results  [facility]  [address] [place], [place] [address]   Phone: [phone]  Fax: [phone]        April 28, 2009 MRN: [id number]    [facility] [address] [place], [place]  [address]    Dear Mr. QUINNEY,  I am pleased to inform you that the colon polyp(s) removed during your recent colonoscopy was (were) found to be benign (no cancer detected) upon pathologic examination.  I recommend you have a repeat colonoscopy examination in 3_ years to look for recurrent polyps, as having colon polyps increases your risk for having recurrent polyps or even colon cancer in the future.  Should you develop new or worsening symptoms of abdominal pain, bowel habit changes or bleeding from the rectum or bowels, please schedule an evaluation with either your primary care physician or with me.  Additional information/recommendations:  __ No further action with gastroenterology is needed at this time. Please      follow-up with your primary care physician for your other healthcare      needs.  __ Please call [phone] to schedule a return visit to review your      situation.  __ Please keep your follow-up visit as already scheduled.  _x_ Continue treatment plan as outlined the day of your exam.  Please call us if you are having persistent problems or have questions about your condition that have not been fully answered at this time.  Sincerely,  Louis Meckel MD  This letter has been electronically signed by your physician.  Appended Document: Patient Notice- Polyp Results Letter mailed 01.10.11

## 2010-05-23 NOTE — Assessment & Plan Note (Signed)
Summary: ed//needs inj//ccm   Vital Signs:  Patient profile:   77 year old male Weight:      195 pounds  Primary Care Provider:  Birdie Sons MD   History of Present Illness:  Follow-Up Visit      This is a 77 year old man who presents for Follow-up visit.  The patient denies chest pain and palpitations.  Since the last visit the patient notes no new problems or concerns.  The patient reports taking meds as prescribed.  When questioned about possible medication side effects, the patient notes none.   Testosterone injections: he is feeling more energetic  need sflu immunization  Allergies: 1)  ! Beta Blockers  Past History:  Past Medical History: Last updated: 06/02/2007 benign kidney tumor chronic headache  (neuro Hypertension Nephrolithiasis, hx of Renal insufficiency Peripheral vascular disease Benign prostatic hypertrophy Coronary artery disease COPD - NOS. Per hx was dxed by Dr Cato Mulligan in 2006 on clinical grounds. Also seen on Ct chest jan 2009 detached retina Asthma Allergic Rhinitis Sleep apnea in 2001 per hx. resolved after he stopped beta blockers  Past Surgical History: Last updated: 05/12/2007 Nephrectomy, partial (benighn tumor) Vasectomy Percutaneous transluminal coronary angioplasty  1. Abdominal aortic aneurysm repair in 1997.  2. 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  3. Status post PTCA in 2001 and 2002 as noted.  4. He had bilateral cataract surgery in the past.  Family History: Last updated: 12/09/2009 Family History of Hypertension:  Family History of Renal Disease:  Family History of Kidney stones  Social History: Last updated: 05/12/2007 Occupation:Works at [employer] Former Smoker Regular exercise-yes Chlorine expoisure present x 35 years  Risk Factors: Exercise: yes (01/31/2007)  Risk Factors: Smoking Status: quit > 6 months (12/12/2009) Passive Smoke Exposure: no (12/12/2009)  Physical  Exam  General:  alert and well-developed.   Head:  normocephalic and atraumatic.   Eyes:  pupils equal and pupils round.   Ears:  R ear normal and L ear normal.   Neck:  supple and full ROM.   Lungs:  normal respiratory effort and no intercostal retractions.   Heart:  normal rate and regular rhythm.   Abdomen:  soft and non-tender.   Skin:  turgor normal and color normal.   Psych:  good eye contact and not anxious appearing.     Impression & Recommendations:  Problem # 1:  ERECTILE DYSFUNCTION (ICD-302.72)  testosterone deficiency---he feels better on testosterone still with ED  Orders: Testosterone Cypionat 200mg  ing (J1080) Admin of patients own med IM/SQ (11914N) Urology Referral (Urology)  Problem # 2:  CORONARY ARTERY DISEASE (ICD-414.00)  no sxs His updated medication list for this problem includes:    Aspir-81 81 Mg Tbec (Aspirin) .Marland Kitchen... Take 1 tablet by mouth once a day    Lisinopril 20 Mg Tabs (Lisinopril) .Marland Kitchen... Take 1 tablet by mouth    Doxazosin Mesylate 8 Mg Tabs (Doxazosin mesylate) .Marland Kitchen... 1 by mouth once daily    Nitrostat 0.4 Mg Subl (Nitroglycerin) .Marland Kitchen... 1 tablet under tongue at onset of chest pain; you may repeat every 5 minutes for up to 3 doses.  Orders: Venipuncture (82956) TLB-Lipid Panel (80061-LIPID) TLB-Hepatic/Liver Function Pnl (80076-HEPATIC)  Problem # 3:  C O P D (ICD-496) no significant sxs His updated medication list for this problem includes:    Spiriva Handihaler 18 Mcg Caps (Tiotropium bromide monohydrate) ..... Inhale 1 capsule once daily  Problem # 4:  HYPERTENSION (ICD-401.9)  controlled continue current  medications  His updated medication list for this problem includes:    Lisinopril 20 Mg Tabs (Lisinopril) .Marland Kitchen... Take 1 tablet by mouth    Doxazosin Mesylate 8 Mg Tabs (Doxazosin mesylate) .Marland Kitchen... 1 by mouth once daily  Prior BP: 128/80 (12/20/2009)  Labs Reviewed: K+: 4.8 (03/21/2009) Creat: : 1.4 (03/21/2009)   Chol: 94  (05/23/2009)   HDL: 30.80 (05/23/2009)   LDL: 53 (05/23/2009)   TG: 53.0 (05/23/2009)  Orders: TLB-BMP (Basic Metabolic Panel-BMET) (80048-METABOL)  Complete Medication List: 1)  Zolpidem Tartrate 5 Mg Tabs (Zolpidem tartrate) .... Take 1 tablet by mouth at bedtime 2)  Aspir-81 81 Mg Tbec (Aspirin) .... Take 1 tablet by mouth once a day 3)  Ibuprofen 800 Mg Tabs (Ibuprofen) .... Take 1 tablet by mouth four times a dayprn 4)  Lisinopril 20 Mg Tabs (Lisinopril) .... Take 1 tablet by mouth 5)  Omeprazole 20 Mg Cpdr (Omeprazole) .... .bidtab y 6)  Proscar 5 Mg Tabs (Finasteride) .Marland Kitchen.. 1 by mouth once daily 7)  Depo-testosterone 200 Mg/ml Oil (Testosterone cypionate) .Marland Kitchen.. 1 cc im every month- to be done at physician's office 8)  Doxazosin Mesylate 8 Mg Tabs (Doxazosin mesylate) .Marland Kitchen.. 1 by mouth once daily 9)  Lorazepam 1 Mg Tabs (Lorazepam) .... Take 1 tablet by mouth once a day as needed  limit to #20 per 30 days 10)  Spiriva Handihaler 18 Mcg Caps (Tiotropium bromide monohydrate) .... Inhale 1 capsule once daily 11)  Topamax 25 Mg Tabs (Topiramate) .... 3 by mouth daily 12)  Miralax Powd (Polyethylene glycol 3350) .Marland Kitchen.. 17g by mouth once daily as needed constipation 13)  Senokot 8.6 Mg Tabs (Sennosides) .... Use three times a day hold for loose stools. 14)  Hydrocodone-acetaminophen 5-325 Mg Tabs (Hydrocodone-acetaminophen) .... Take 1 tablet by mouth once a day as needed pain 15)  Robitussin Dm 100-10 Mg/22ml Syrp (Dextromethorphan-guaifenesin) 16)  Nitrostat 0.4 Mg Subl (Nitroglycerin) .Marland Kitchen.. 1 tablet under tongue at onset of chest pain; you may repeat every 5 minutes for up to 3 doses.  Other Orders: Admin 1st Vaccine (16109) Flu Vaccine 23yrs + [phone])   Medication Administration  Injection # 1:    Medication: Testosterone Cypionat 200mg  ing    Diagnosis: ERECTILE DYSFUNCTION (ICD-302.72)    Route: IM    Site: RUOQ gluteus    Exp Date: 06/21/2012    Lot #: [id number]    Mfr: Pfizer     Given by: Lynann Beaver CMA (February 06, 2010 12:07 PM)  Orders Added: 1)  Est. Patient Level IV [99214] 2)  Admin 1st Vaccine [90471] 3)  Flu Vaccine 46yrs + [09811] 4)  Venipuncture [91478] 5)  Testosterone Cypionat 200mg  ing [J1080] 6)  Admin of patients own med IM/SQ [96372M] 7)  Urology Referral [Urology] 8)  TLB-Lipid Panel [80061-LIPID] 9)  TLB-Hepatic/Liver Function Pnl [80076-HEPATIC] 10)  TLB-BMP (Basic Metabolic Panel-BMET) [80048-METABOL] Flu Vaccine Consent Questions     Do you have a history of severe allergic reactions to this vaccine? no    Any prior history of allergic reactions to egg and/or gelatin? no    Do you have a sensitivity to the preservative Thimersol? no    Do you have a past history of Guillan-Barre Syndrome? no    Do you currently have an acute febrile illness? no    Have you ever had a severe reaction to latex? no    Vaccine information given and explained to patient? yes    Are you currently pregnant? no  Lot Number:[id number]   Exp Date:10/21/2010   Site Given  Left Deltoid IMology Referral [Urology] 3)  Admin 1st Vaccine [90471] 4)  Flu Vaccine 52yrs + [id number]     .lbflu

## 2010-05-23 NOTE — Letter (Signed)
Summary: [facility]   Imported By: Maryln Gottron 03/10/2010 12:27:13  _____________________________________________________________________  External Attachment:    Type:   Image     Comment:   External Document

## 2010-05-23 NOTE — Progress Notes (Signed)
Summary: lab results  Phone Note Call from Patient   Caller: Patient Call For: Birdie Sons MD Summary of Call: [phone] Pt is calling for lab results from 05/23/2009. Initial call taken by: Lynann Beaver CMA,  June 03, 2009 11:07 AM  Follow-up for Phone Call        Patient notified.  Follow-up by: Gladis Riffle, RN,  June 03, 2009 4:36 PM

## 2010-05-23 NOTE — Progress Notes (Signed)
Summary: question  Phone Note From Pharmacy Call back at [phone] phone   [facility]er: [facility]  [place] [address] via fax Call For: Luda Charbonneau  Summary of Call: refill hydrocodone-apap 5-500 Take 1 tablet by mouth two times a day as needed   #60 x3 Last 11-15-08 Initial call taken by: Gladis Riffle, RN,  July 07, 2009 11:09 AM  Follow-up for Phone Call         Thiswas removed from list 03/21/09 as his wife was taking his medication  back pain resolved in problem list Follow-up by: Gladis Riffle, RN,  July 07, 2009 11:10 AM  Additional Follow-up for Phone Call Additional follow up Details #1::        no refills Additional Follow-up by: Birdie Sons MD,  July 07, 2009 2:51 PM

## 2010-05-23 NOTE — Assessment & Plan Note (Signed)
Summary: consult ZO:[id number] inj/cjr   Vital Signs:  Patient profile:   77 year old male Weight:      200 pounds BMI:     28.00 Pulse rate:   78 / minute Pulse rhythm:   regular Resp:     12 per minute BP sitting:   128 / 64  (left arm) Cuff size:   regular  Vitals Entered By: Gladis Riffle, RN (December 12, 2009 11:52 AM) CC: personal Is Patient Diabetic? No   Primary Care Provider:  Birdie Sons MD  CC:  personal.  History of Present Illness:  Follow-Up Visit      This is a 77 year old man who presents for Follow-up visit.  The patient denies chest pain and palpitations.  Since the last visit the patient notes no new problems or concerns except is seeing dr Luciana Axe for a "busted blood vessel"---he would like a second opinion. In addition he complains a lot of mucous-he notes an occasional productive cough--clear mucous. Also complains of ED. he is using testosterone injections. has tried cialis with some success in the past. .  The patient reports taking meds as prescribed.  When questioned about possible medication side effects, the patient notes none.    All other systems reviewed and were negative   Preventive Screening-Counseling & Management  Alcohol-Tobacco     Smoking Status: quit > 6 months     Year Started: 1949     Year Quit: 1999     Pack years: 50     Passive Smoke Exposure: no  Current Medications (verified): 1)  Zolpidem Tartrate 5 Mg Tabs (Zolpidem Tartrate) .... Take 1 Tablet By Mouth At Bedtime 2)  Aspir-81 81 Mg Tbec (Aspirin) .... Take 1 Tablet By Mouth Once A Day 3)  Ibuprofen 800 Mg Tabs (Ibuprofen) .... Take 1 Tablet By Mouth Four Times A Dayprn 4)  Lisinopril 20 Mg Tabs (Lisinopril) .... Take 1 Tablet By Mouth 5)  Omeprazole 20 Mg Cpdr (Omeprazole) .... .bidtab Y 6)  Proscar 5 Mg  Tabs (Finasteride) .Marland Kitchen.. 1 By Mouth Once Daily 7)  Depo-Testosterone 200 Mg/ml  Oil (Testosterone Cypionate) .Marland Kitchen.. 1 Cc Im Every Month- To Be Done At Physician's  Office 8)  Doxazosin Mesylate 8 Mg Tabs (Doxazosin Mesylate) .Marland Kitchen.. 1 By Mouth Once Daily 9)  Lorazepam 1 Mg  Tabs (Lorazepam) .... Take 1 Tablet By Mouth Once A Day As Needed  Limit To #20 Per 30 Days 10)  Spiriva Handihaler 18 Mcg  Caps (Tiotropium Bromide Monohydrate) .... Inhale 1 Capsule Once Daily 11)  Cialis 20 Mg Tabs (Tadalafil) .... Take One As Directed 12)  Topamax 25 Mg Tabs (Topiramate) .... 3 By Mouth Daily 13)  Miralax   Powd (Polyethylene Glycol 3350) .Marland Kitchen.. 17g By Mouth Once Daily As Needed Constipation 14)  Senokot 8.6 Mg Tabs (Sennosides) .... Use Three Times A Day Hold For Loose Stools. 15)  Simvastatin 40 Mg Tabs (Simvastatin) .... Take 1 Tablet By Mouth At Bedtime 16)  Hydrocodone-Acetaminophen 5-325 Mg Tabs (Hydrocodone-Acetaminophen) .... Take 1 Tablet By Mouth Once A Day As Needed Pain 17)  Colace 100 Mg Caps (Docusate Sodium) .... Two Times A Day--Hold For Loose Stools  Allergies: 1)  ! Beta Blockers  Past History:  Past Medical History: Last updated: 06/02/2007 benign kidney tumor chronic headache  (neuro Hypertension Nephrolithiasis, hx of Renal insufficiency Peripheral vascular disease Benign prostatic hypertrophy Coronary artery disease COPD - NOS. Per hx was dxed by Dr Cato Mulligan in 2006 on  clinical grounds. Also seen on Ct chest jan 2009 detached retina Asthma Allergic Rhinitis Sleep apnea in 2001 per hx. resolved after he stopped beta blockers  Past Surgical History: Last updated: 05/12/2007 Nephrectomy, partial (benighn tumor) Vasectomy Percutaneous transluminal coronary angioplasty  1. Abdominal aortic aneurysm repair in 1997.  2. 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  3. Status post PTCA in 2001 and 2002 as noted.  4. He had bilateral cataract surgery in the past.  Family History: Last updated: 12/09/2009 Family History of Hypertension:  Family History of Renal Disease:  Family History of Kidney stones  Social  History: Last updated: 05/12/2007 Occupation:Works at [employer] Former Smoker Regular exercise-yes Chlorine expoisure present x 35 years  Risk Factors: Exercise: yes (01/31/2007)  Risk Factors: Smoking Status: quit > 6 months (12/12/2009) Passive Smoke Exposure: no (12/12/2009)  Physical Exam  General:  alert and well-developed.   Head:  normocephalic and atraumatic.   Eyes:  pupils equal and pupils round.   Ears:  R ear normal and L ear normal.   Neck:  supple and full ROM.   Chest Wall:  No deformities, masses, tenderness or gynecomastia noted. Lungs:  normal respiratory effort and no intercostal retractions.   Heart:  normal rate and regular rhythm.   Abdomen:  soft and non-tender.   Msk:  No deformity or scoliosis noted of thoracic or lumbar spine.   Skin:  turgor normal and color normal.     Impression & Recommendations:  Problem # 1:  ERECTILE DYSFUNCTION (ICD-302.72) continue testosterone replacement add cialis side effects discussed  His updated medication list for this problem includes:    Cialis 20 Mg Tabs (Tadalafil) .Marland Kitchen... Take one as directed  Problem # 2:  TESTOSTERONE DEFICIENCY (ICD-257.2) continue replacement Orders: Admin of patients own med IM/SQ (16109U)  Problem # 3:  CORONARY ARTERY DISEASE (ICD-414.00)  His updated medication list for this problem includes:    Aspir-81 81 Mg Tbec (Aspirin) .Marland Kitchen... Take 1 tablet by mouth once a day    Lisinopril 20 Mg Tabs (Lisinopril) .Marland Kitchen... Take 1 tablet by mouth    Doxazosin Mesylate 8 Mg Tabs (Doxazosin mesylate) .Marland Kitchen... 1 by mouth once daily  Labs Reviewed: Chol: 94 (05/23/2009)   HDL: 30.80 (05/23/2009)   LDL: 53 (05/23/2009)   TG: 53.0 (05/23/2009)  Problem # 4:  HYPERTENSION (ICD-401.9)  His updated medication list for this problem includes:    Lisinopril 20 Mg Tabs (Lisinopril) .Marland Kitchen... Take 1 tablet by mouth    Doxazosin Mesylate 8 Mg Tabs (Doxazosin mesylate) .Marland Kitchen... 1 by mouth once daily  BP  today: 128/64 Prior BP: 132/66 (09/05/2009)  Labs Reviewed: K+: 4.8 (03/21/2009) Creat: : 1.4 (03/21/2009)   Chol: 94 (05/23/2009)   HDL: 30.80 (05/23/2009)   LDL: 53 (05/23/2009)   TG: 53.0 (05/23/2009)  Complete Medication List: 1)  Zolpidem Tartrate 5 Mg Tabs (Zolpidem tartrate) .... Take 1 tablet by mouth at bedtime 2)  Aspir-81 81 Mg Tbec (Aspirin) .... Take 1 tablet by mouth once a day 3)  Ibuprofen 800 Mg Tabs (Ibuprofen) .... Take 1 tablet by mouth four times a dayprn 4)  Lisinopril 20 Mg Tabs (Lisinopril) .... Take 1 tablet by mouth 5)  Omeprazole 20 Mg Cpdr (Omeprazole) .... .bidtab y 6)  Proscar 5 Mg Tabs (Finasteride) .Marland Kitchen.. 1 by mouth once daily 7)  Depo-testosterone 200 Mg/ml Oil (Testosterone cypionate) .Marland Kitchen.. 1 cc im every month- to be done at physician's office 8)  Doxazosin Mesylate 8  Mg Tabs (Doxazosin mesylate) .Marland Kitchen.. 1 by mouth once daily 9)  Lorazepam 1 Mg Tabs (Lorazepam) .... Take 1 tablet by mouth once a day as needed  limit to #20 per 30 days 10)  Spiriva Handihaler 18 Mcg Caps (Tiotropium bromide monohydrate) .... Inhale 1 capsule once daily 11)  Cialis 20 Mg Tabs (Tadalafil) .... Take one as directed 12)  Topamax 25 Mg Tabs (Topiramate) .... 3 by mouth daily 13)  Miralax Powd (Polyethylene glycol 3350) .Marland Kitchen.. 17g by mouth once daily as needed constipation 14)  Senokot 8.6 Mg Tabs (Sennosides) .... Use three times a day hold for loose stools. 15)  Simvastatin 40 Mg Tabs (Simvastatin) .... Take 1 tablet by mouth at bedtime 16)  Hydrocodone-acetaminophen 5-325 Mg Tabs (Hydrocodone-acetaminophen) .... Take 1 tablet by mouth once a day as needed pain 17)  Colace 100 Mg Caps (Docusate sodium) .... Two times a day--hold for loose stools 18)  Robitussin Dm 100-10 Mg/55ml Syrp (Dextromethorphan-guaifenesin)  Other Orders: Ophthalmology Referral (Ophthalmology) Prescriptions: CIALIS 20 MG TABS (TADALAFIL) take one as directed  #5 x PRN   Entered and Authorized by:   Birdie Sons MD   Signed by:   Birdie Sons MD on 12/12/2009   Method used:   [facility]        [facility]  [place] [address]       [address]       [place], [place]  [address]       Ph: [phone] or [phone]       Fax: [phone]   RxID:   [id number]    Medication Administration  Injection # 1:    Medication: Testosterone Cypionat 200mg  ing    Diagnosis: TESTOSTERONE DEFICIENCY (ICD-257.2)    Route: IM    Site: RUOQ gluteus    Exp Date: 06/21/2012    Lot #: [id number]    Mfr: pfizer    Patient tolerated injection without complications    Given by: Gladis Riffle, RN (December 12, 2009 12:25 PM)  Orders Added: 1)  Ophthalmology Referral [Ophthalmology] 2)  Est. Patient Level IV [06269] 3)  Admin of patients own med IM/SQ [48546E]

## 2010-06-13 ENCOUNTER — Encounter: Payer: Self-pay | Admitting: Internal Medicine

## 2010-06-13 ENCOUNTER — Ambulatory Visit (INDEPENDENT_AMBULATORY_CARE_PROVIDER_SITE_OTHER): Payer: Medicare PPO | Admitting: Internal Medicine

## 2010-06-13 DIAGNOSIS — J40 Bronchitis, not specified as acute or chronic: Secondary | ICD-10-CM

## 2010-06-13 MED ORDER — DOXYCYCLINE MONOHYDRATE 100 MG PO TABS: 100.0000 mg | ORAL_TABLET | Freq: Two times a day (BID) | ORAL | Status: AC

## 2010-06-13 MED ORDER — ALBUTEROL 90 MCG/ACT IN AERS: 2.0000 | INHALATION_SPRAY | Freq: Four times a day (QID) | RESPIRATORY_TRACT | Status: AC | PRN

## 2010-06-13 NOTE — Progress Notes (Signed)
  Subjective:    Patient ID: [name], male    DOB: October 13, 1930, 77 y.o.   MRN: [id number]  HPI Pt presents to clinic for evaluation of cough. Notes 1 wk h/o of chest congestion, cough productive for yellow sputum and nasal congestion. Denies hemoptysis. States h/o copd and has noted recent intermittent wheezing. No alleviating or exacerbating factors. Not taking any medications for problems. No other complaints.  Reviewed PMH, medications, and allergies.    Review of Systems  Constitutional: Negative for fever, chills, diaphoresis and fatigue.  HENT: Positive for congestion. Negative for ear pain and ear discharge.   Eyes: Negative for discharge and redness.  Respiratory: Positive for cough and wheezing. Negative for shortness of breath.   Skin: Negative for color change and rash.       Objective:   Physical Exam  Constitutional: He appears well-developed and well-nourished. No distress.  HENT:  Head: Normocephalic and atraumatic.  Right Ear: Tympanic membrane, external ear and ear canal normal.  Left Ear: Tympanic membrane, external ear and ear canal normal.  Nose: Nose normal.  Mouth/Throat: Oropharynx is clear and moist. No oropharyngeal exudate.  Eyes: Right eye exhibits no discharge. Left eye exhibits no discharge. No scleral icterus.  Neck: No JVD present.  Cardiovascular: Normal rate, regular rhythm and normal heart sounds.  Exam reveals no gallop and no friction rub.   No murmur heard. Pulmonary/Chest: Effort normal and breath sounds normal. No respiratory distress. He has no wheezes. He has no rales.  Lymphadenopathy:    He has no cervical adenopathy.  Skin: Skin is warm and dry. No rash noted. He is not diaphoretic. No erythema.          Assessment & Plan:

## 2010-06-13 NOTE — Assessment & Plan Note (Signed)
Begin abx tx. Provide with albuterol mdi prn wheezing. Followup closely if no improvement or worsening.

## 2010-06-15 ENCOUNTER — Other Ambulatory Visit: Payer: Self-pay | Admitting: Internal Medicine

## 2010-06-16 ENCOUNTER — Encounter: Payer: Self-pay | Admitting: Internal Medicine

## 2010-06-16 ENCOUNTER — Ambulatory Visit (INDEPENDENT_AMBULATORY_CARE_PROVIDER_SITE_OTHER)
Admission: RE | Admit: 2010-06-16 | Discharge: 2010-06-16 | Disposition: A | Discharge status: Home / Self Care | Payer: Medicare PPO | Source: Ambulatory Visit | Attending: Internal Medicine | Admitting: Internal Medicine

## 2010-06-16 ENCOUNTER — Ambulatory Visit (INDEPENDENT_AMBULATORY_CARE_PROVIDER_SITE_OTHER): Payer: Medicare PPO | Admitting: Internal Medicine

## 2010-06-16 DIAGNOSIS — R042 Hemoptysis: Secondary | ICD-10-CM

## 2010-06-16 DIAGNOSIS — I10 Essential (primary) hypertension: Secondary | ICD-10-CM

## 2010-06-16 MED ORDER — LEVOFLOXACIN 500 MG PO TABS: 500.0000 mg | ORAL_TABLET | Freq: Every day | ORAL | Status: AC

## 2010-06-19 ENCOUNTER — Encounter: Payer: Self-pay | Admitting: Internal Medicine

## 2010-06-19 DIAGNOSIS — R042 Hemoptysis: Secondary | ICD-10-CM | POA: Insufficient documentation

## 2010-06-19 NOTE — Progress Notes (Signed)
  Subjective:    Patient ID: [name], male    DOB: 07/17/1930, 77 y.o.   MRN: [id number]  HPI Pt presents to clinic for evaluation of hemoptysis and elevated BP. Recently evaluated for possible bronchitis and placed on doxycycline. Notes definite clinical improvement with abx however this am had three episodes of mild hemoptysis with associated productive cough. Examined sputum brought from home and shows colored sputum with approximately 1/2 tsp or less of blood adjoining sputum. Denies gross active bleeding and has had no further hemoptysis. No fever/chill or dyspnea. No alleviating or exacerbating factors. Also notes bp has been elevated since illness and reviewed today as 160/88. Has begun today to take extra 1/2 dose of bp medication without adverse effect.   Reviewed PMH, medications and allergies.  Review of Systems  Constitutional: Negative for fever, chills and fatigue.  HENT: Negative for facial swelling and ear discharge.   Eyes: Negative for pain and discharge.  Respiratory: Positive for cough. Negative for shortness of breath and wheezing.   Neurological: Negative for dizziness, syncope and headaches.       Objective:   Physical Exam  Constitutional: He appears well-developed and well-nourished. No distress.  HENT:  Head: Normocephalic and atraumatic.  Right Ear: External ear normal.  Left Ear: External ear normal.  Nose: Nose normal.  Mouth/Throat: Oropharynx is clear and moist. No oropharyngeal exudate.  Eyes: Conjunctivae are normal. No scleral icterus.  Neck: Neck supple.  Cardiovascular: Normal rate, regular rhythm and normal heart sounds.  Exam reveals no gallop and no friction rub.   No murmur heard. Pulmonary/Chest: Effort normal and breath sounds normal. No respiratory distress. He has no wheezes. He has no rales.  Lymphadenopathy:    He has no cervical adenopathy.  Neurological: He is alert.  Skin: Skin is warm and dry. No rash noted. He is not diaphoretic.  No erythema.  Psychiatric: He has a normal mood and affect.          Assessment & Plan:

## 2010-06-19 NOTE — Assessment & Plan Note (Signed)
No gross active bleeding. No airway compromise. May represent bronchitis for which he is currently receiving treatment however recommend change of abx to levaquin for more broad coverage. Obtain CXR. Recommend close followup if further hemoptysis. ED if gross active bleeding, dyspnea or airway compromise.

## 2010-06-19 NOTE — Assessment & Plan Note (Signed)
Asx. Mild exacerbation in the setting of recent illness. Pt self adjusted bp medication. Agree to current dose though recommend close outpt monitoring of bp to determine subsequent control.

## 2010-07-10 ENCOUNTER — Ambulatory Visit (INDEPENDENT_AMBULATORY_CARE_PROVIDER_SITE_OTHER): Payer: Medicare PPO | Admitting: Internal Medicine

## 2010-07-10 ENCOUNTER — Other Ambulatory Visit: Payer: Self-pay | Admitting: *Deleted

## 2010-07-10 ENCOUNTER — Encounter: Payer: Self-pay | Admitting: Internal Medicine

## 2010-07-10 DIAGNOSIS — S60459A Superficial foreign body of unspecified finger, initial encounter: Secondary | ICD-10-CM

## 2010-07-10 MED ORDER — HYDROCODONE-ACETAMINOPHEN 5-325 MG PO TABS: 1.0000 | ORAL_TABLET | Freq: Every day | ORAL | Status: DC | PRN

## 2010-07-10 NOTE — Progress Notes (Signed)
  Subjective:    Patient ID: [name], male    DOB: [birth year], 77 y.o.   MRN: [id number]  HPI Pt presents to clinic for evaluation of possible foreign body in finger. Notes at least several week h/o left index finger with painful tender hard core with central opening. Does not recall trauma or injury but wonders if FB present as he works with his hands a lot. Denies drainage or increase in size. No other complaint.  Reviewed PMH, medications and allergies.    Review of Systems  Constitutional: Negative for fever and chills.  Skin: Positive for wound. Negative for color change and rash.  Hematological: Negative for adenopathy. Does not bruise/bleed easily.       Objective:   Physical Exam  [nursing notereviewed. Constitutional: He appears well-developed and well-nourished. No distress.  HENT:  Head: Normocephalic and atraumatic.  Eyes: Conjunctivae are normal. No scleral icterus.  Skin: Skin is warm and dry. He is not diaphoretic.       Left index finger with 1/2 cm area of hyperkeratotic skin. No obivous fb.          Assessment & Plan:

## 2010-07-14 LAB — POCT I-STAT, CHEM 8
Calcium, Ion: 1.16 mmol/L (ref 1.12–1.32)
Creatinine, Ser: 1.4 mg/dL (ref 0.4–1.5)
Glucose, Bld: 95 mg/dL (ref 70–99)

## 2010-07-15 DIAGNOSIS — S60459A Superficial foreign body of unspecified finger, initial encounter: Secondary | ICD-10-CM | POA: Insufficient documentation

## 2010-07-15 NOTE — Assessment & Plan Note (Signed)
Finger cleaned with betadine and alcohol. Using steriole 21 gauge syringe wound is gently opened. Small black dot removed with syringe needle. Feels improvement. Discussed wound care and f/u if wound does not heal or sx's persist.

## 2010-07-18 ENCOUNTER — Encounter: Payer: Self-pay | Admitting: Internal Medicine

## 2010-07-18 ENCOUNTER — Ambulatory Visit: Payer: Medicare PPO | Admitting: Internal Medicine

## 2010-07-19 ENCOUNTER — Encounter: Payer: Self-pay | Admitting: Internal Medicine

## 2010-07-19 ENCOUNTER — Ambulatory Visit (INDEPENDENT_AMBULATORY_CARE_PROVIDER_SITE_OTHER): Payer: Medicare PPO | Admitting: Internal Medicine

## 2010-07-19 VITALS — BP 140/80 | HR 82 | Temp 97.9°F | Wt 207.8 lb

## 2010-07-19 DIAGNOSIS — R0989 Other specified symptoms and signs involving the circulatory and respiratory systems: Secondary | ICD-10-CM

## 2010-07-19 DIAGNOSIS — J449 Chronic obstructive pulmonary disease, unspecified: Secondary | ICD-10-CM | POA: Insufficient documentation

## 2010-07-19 DIAGNOSIS — R079 Chest pain, unspecified: Secondary | ICD-10-CM

## 2010-07-19 NOTE — Progress Notes (Signed)
Subjective:    Patient ID: [name], male    DOB: 1931/03/29, 77 y.o.   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  I have not seen him in 3 years to the month. He is back due to persistent dyspnea despite spiriva which he states is expensive an not working. Though he maintains dyspnea is the same, comparing old notes, I think it is worse. Progressive worsening. Dyspnea walking 40 feet on incline or walking fast in hallway at work. Relieved with rest. Has associated  occassional exertional mild chest pain in center of sternum without radiation especially after lifting heavy objects. Chest pressure always releived by rest. Associated wheeze present. But denies cough, sycnope, hemoptysis, fever, chills, nausea, vomit, diarrhea. When we walked him today, he desaturated on room air.  Review of Systems   Constitutional:   No  weight loss, night sweats,  Fevers, chills, fatigue, lassitude. HEENT:   No headaches,  Difficulty swallowing,  Tooth/dental problems,  Sore throat,                No sneezing, itching, ear ache, nasal congestion, post nasal drip,   CV: No  Orthopnea, PND, swelling in lower extremities, anasarca, dizziness, palpitations  GI  No heartburn, indigestion, abdominal pain, nausea, vomiting, diarrhea, change in bowel habits, loss of appetite  Resp:  No excess mucus, no productive cough,  No non-productive cough,  No coughing up of blood.  No change in color of mucus.    No chest wall deformity  Skin: no rash or lesions.  GU: no dysuria, change in color of urine, no urgency or frequency.  No flank pain.  MS:  No joint pain or swelling.  No decreased range of motion.  No back pain.  Psych:  No change in mood or affect. No depression or anxiety.  No memory loss.     Objective:   Physical Exam  [nursing notereviewed. Constitutional: He is oriented to person, place, and time. He appears well-developed and well-nourished. No distress.    HENT:  Head: Normocephalic and atraumatic.  Right Ear: External ear normal.  Left Ear: External ear normal.  Mouth/Throat: Oropharynx is clear and moist. No oropharyngeal exudate.  Eyes: Conjunctivae and EOM are normal. Pupils are equal, round, and reactive to light. Right eye exhibits no discharge. Left eye exhibits no discharge. No scleral icterus.  Neck: Normal range of motion. Neck supple. No JVD present. No tracheal deviation present. No thyromegaly present.  Cardiovascular: Normal rate, regular rhythm and intact distal pulses.  Exam reveals no gallop and no friction rub.   Murmur: ejection systolic murmur grade 2 in aortic area - NEW on EXAM. Pulmonary/Chest: Effort normal and breath sounds normal. No respiratory distress. He has no wheezes. He has no rales. He exhibits no tenderness.  Abdominal: Soft. Bowel sounds are normal. He exhibits no distension and no mass. There is no tenderness. There is no rebound and no guarding.  Musculoskeletal: Normal range of motion. He exhibits no edema and no tenderness.  Lymphadenopathy:    He has no cervical adenopathy.  Neurological: He is alert and oriented to person, place, and time. He has normal reflexes. No cranial nerve deficit. Coordination normal.  Skin: Skin is warm and dry. No rash noted. He is not diaphoretic. No erythema. No pallor.  Psychiatric: He has a normal mood and affect. His behavior is normal. Judgment and thought content normal.  Assessment & Plan:

## 2010-07-19 NOTE — Assessment & Plan Note (Signed)
?   Due to age related decline in COPD - he desaturated with exertion today Doubt worsening fibrosis of lung (CXR Feb 2012 does not show this) ? Developing aortic stenosis v CAD worsening  PLAN Full PFT Discuss o2 Rx at folllwup Refer cardiology At followup check alpha 1

## 2010-07-19 NOTE — Patient Instructions (Signed)
You dropped oxygen levels with  Walking - so it is important you have a full breathing test called PFT and come back for followup You likely need oxygen but we can discuss that after breathing test results The heart murmur appears to be new and because you are also reporting chest pain - I am sending you back to see Dr. Daleen Squibb Return to see me after breathing test to discuss treatment options - return in less than 10 days For now you do not need CT chest but we can decide that based on PFT result

## 2010-07-19 NOTE — Assessment & Plan Note (Signed)
This appears new in last few years/months and is not documented in other office visits with other providers. Also, new systolic murmur and worsening dyspnea. Wonder if he has aortic sstenosis or worsening CAD or both. Will refer to Dr Juanito Doom

## 2010-07-19 NOTE — Progress Notes (Signed)
SATURATION QUALIFICATIONS:  Patient Saturations on Room Air at Rest = 94%  Patient Saturations on Room Air while Ambulating = 87%  Patient Saturations on 2 Liters of oxygen while Ambulating = 94%  Carron Curie, CMA

## 2010-07-24 ENCOUNTER — Encounter: Payer: Self-pay | Admitting: Cardiology

## 2010-07-24 ENCOUNTER — Ambulatory Visit (INDEPENDENT_AMBULATORY_CARE_PROVIDER_SITE_OTHER): Payer: Medicare PPO | Admitting: Cardiology

## 2010-07-24 VITALS — BP 122/70 | HR 88 | Ht 73.0 in | Wt 203.0 lb

## 2010-07-24 DIAGNOSIS — I359 Nonrheumatic aortic valve disorder, unspecified: Secondary | ICD-10-CM | POA: Insufficient documentation

## 2010-07-24 DIAGNOSIS — R011 Cardiac murmur, unspecified: Secondary | ICD-10-CM

## 2010-07-24 DIAGNOSIS — I351 Nonrheumatic aortic (valve) insufficiency: Secondary | ICD-10-CM | POA: Insufficient documentation

## 2010-07-24 DIAGNOSIS — R079 Chest pain, unspecified: Secondary | ICD-10-CM

## 2010-07-24 NOTE — Patient Instructions (Signed)
Your physician has requested that you have an echocardiogram. Echocardiography is a painless test that uses sound waves to create images of your heart. It provides your doctor with information about the size and shape of your heart and how well your heart's chambers and valves are working. This procedure takes approximately one hour. There are no restrictions for this procedure.  Your physician recommends that you schedule a follow-up appointment in: 12 months with Dr Daleen Squibb.

## 2010-07-25 ENCOUNTER — Encounter: Payer: Self-pay | Admitting: Cardiology

## 2010-07-25 NOTE — Progress Notes (Signed)
   Patient ID: [name], male    DOB: 1930/10/13, 77 y.o.   MRN: [id number]  HPI [name] is referred today for a new murmur heard by Dr Marchelle Gearing. He has significant DOE which has been attributed to his pulmonary fibrosis and pulmonary hypertension. He says he is no worse than usual. No exertional chest pain. Denies presycope, syncope. Has a chronic LBBB. Also denies any orthopnea or edema.  EKG with NSR and LBBB.   Review of Systems  All other systems reviewed and are negative.      Physical Exam  Nursing note and vitals reviewed. Constitutional: He is oriented to person, place, and time. He appears well-developed and well-nourished. No distress.  HENT:  Head: Atraumatic.  Eyes: EOM are normal. Pupils are equal, round, and reactive to light.  Neck: Neck supple. No JVD present. No tracheal deviation present. No thyromegaly present.  Cardiovascular: Normal rate, regular rhythm and intact distal pulses.  Exam reveals no gallop.        Soft systolic murmur RUSB, paradoxically split S2  Pulmonary/Chest: Effort normal and breath sounds normal.  Abdominal: Soft. Bowel sounds are normal. He exhibits no distension.  Musculoskeletal: Normal range of motion. He exhibits no edema.  Neurological: He is oriented to person, place, and time.  Skin: Skin is warm and dry.  Psychiatric: He has a normal mood and affect.

## 2010-07-27 ENCOUNTER — Other Ambulatory Visit (HOSPITAL_COMMUNITY): Payer: Self-pay | Admitting: Cardiology

## 2010-07-27 DIAGNOSIS — R011 Cardiac murmur, unspecified: Secondary | ICD-10-CM

## 2010-07-28 LAB — DIFFERENTIAL
Basophils Absolute: 0 10*3/uL (ref 0.0–0.1)
Basophils Relative: 0 % (ref 0–1)
Eosinophils Absolute: 0.1 10*3/uL (ref 0.0–0.7)
Eosinophils Relative: 1 % (ref 0–5)
Monocytes Absolute: 0.8 10*3/uL (ref 0.1–1.0)

## 2010-07-28 LAB — COMPREHENSIVE METABOLIC PANEL
AST: 22 U/L (ref 0–37)
Albumin: 3.3 g/dL — ABNORMAL LOW (ref 3.5–5.2)
Alkaline Phosphatase: 63 U/L (ref 39–117)
BUN: 22 mg/dL (ref 6–23)
Chloride: 116 mEq/L — ABNORMAL HIGH (ref 96–112)
GFR calc Af Amer: 50 mL/min — ABNORMAL LOW (ref >60)
Potassium: 4.1 mEq/L (ref 3.5–5.1)
Total Bilirubin: 0.6 mg/dL (ref 0.3–1.2)

## 2010-07-28 LAB — URINALYSIS, ROUTINE W REFLEX MICROSCOPIC
Glucose, UA: NEGATIVE mg/dL
Protein, ur: NEGATIVE mg/dL
Specific Gravity, Urine: 1.012 (ref 1.005–1.030)

## 2010-07-28 LAB — CBC
HCT: 41.6 % (ref 39.0–52.0)
Platelets: 170 10*3/uL (ref 150–400)
WBC: 12 10*3/uL — ABNORMAL HIGH (ref 4.0–10.5)

## 2010-07-31 ENCOUNTER — Ambulatory Visit (HOSPITAL_COMMUNITY): Payer: Medicare PPO | Attending: Cardiology

## 2010-07-31 DIAGNOSIS — R0989 Other specified symptoms and signs involving the circulatory and respiratory systems: Secondary | ICD-10-CM | POA: Insufficient documentation

## 2010-07-31 DIAGNOSIS — I079 Rheumatic tricuspid valve disease, unspecified: Secondary | ICD-10-CM | POA: Insufficient documentation

## 2010-07-31 DIAGNOSIS — I379 Nonrheumatic pulmonary valve disorder, unspecified: Secondary | ICD-10-CM | POA: Insufficient documentation

## 2010-07-31 DIAGNOSIS — R0609 Other forms of dyspnea: Secondary | ICD-10-CM | POA: Insufficient documentation

## 2010-07-31 DIAGNOSIS — I080 Rheumatic disorders of both mitral and aortic valves: Secondary | ICD-10-CM | POA: Insufficient documentation

## 2010-07-31 DIAGNOSIS — I2789 Other specified pulmonary heart diseases: Secondary | ICD-10-CM | POA: Insufficient documentation

## 2010-07-31 DIAGNOSIS — R011 Cardiac murmur, unspecified: Secondary | ICD-10-CM | POA: Insufficient documentation

## 2010-08-07 ENCOUNTER — Telehealth: Payer: Self-pay | Admitting: Cardiology

## 2010-08-07 NOTE — Telephone Encounter (Signed)
RN spoke with Pt: aware of Echo results. Pt informed re: 3 month follow up with Dr Daleen Squibb, prefers Mondays. RN advised someone will call back to schedule 3 Month Appt. Pt verbalizes understanding.

## 2010-08-08 NOTE — Telephone Encounter (Signed)
I spoke with pt and made appt for 10/30/10 at 0900. Mylo Red RN

## 2010-09-05 NOTE — Assessment & Plan Note (Signed)
[facility]                            CARDIOLOGY OFFICE NOTE   NAME:Goar, CAIO DEVERA                          MRN:          [id number]  DATE:05/31/2008                            DOB:          [birth year]    Ms. Cuadra comes in today for a followup.   I have not seen him in the office since July 2007.  Dr. Smitty Cords Swords  lined up a stress Myoview in January 2009, which showed an EF of 40%,  fixed defect in inferior lateral and inferior septal walls.   His biggest complaint is that of shortness of breath.  He has a 50-pack-  year smoking history, though he quit over 10 years ago.  He has a lot of  problems with mucous particularly at night.   He sleeps in a recliner, but that is because his hips hurt.  He denies  any true orthopnea or PND and has had really no major peripheral edema.   He has refused to take statins.  Please refer to my previous notes.  In  addition, he has had problems with breathing issues with beta-blockers.   CURRENT MEDICATIONS:  1. Proscar 5 mg a day.  2. Cardura 8 mg per day.  3. Enteric-coated aspirin 81 mg a day.  4. Lisinopril 20 mg per day.  5. Omeprazole 20 mg per day.  6. Spiriva inhaler daily.  7. Nitroglycerin p.r.n.   PHYSICAL EXAMINATION:  VITAL SIGNS:  His blood pressure today is 140/80,  his pulse is 82 and regular, his weight is 215.3, and respiratory rate  is 18.  GENERAL:  He is somewhat chronically ill-appearing.  HEENT:  Sclerae are slightly injected.  Facial symmetry is normal.  Dentition is satisfactory.  NECK:  Supple.  There is no JVD.  Carotids are full without bruits.  Thyroid is not enlarged.  Trachea is midline.  LUNGS:  Clear with no expiratory rhonchi or wheezes.  He has decreased  breath sounds throughout.  HEART:  A poorly appreciated PMI.  Normal S1 and S2.  No gallop.  ABDOMEN:  Old midline incision for ruptured abdominal aortic aneurysm in  1997.  There is no hepatomegaly.  There is no  tenderness.  There is no  midline bruit or pulsatile mass.  EXTREMITIES:  No cyanosis, clubbing, or edema.  Pulses were present.  NEUROLOGIC:  Intact.  SKIN:  Few ecchymoses.   His EKG shows sinus rhythm with first-degree AV block with some LVH and  repolarization compared to his old EKG, unchanged.   ASSESSMENT AND PLAN:  Ms. Yuan is stable from our standpoint.  I think  most of his shortness of breath is pulmonary related.  Unfortunately, he  refuses to take his statin and he cannot take beta-blockers.   He has not had an abdominal ultrasound over 3 years for his history of  aneurysm.  We will repeat that.  I will plan on seeing him back in a  year, otherwise.  I have encouraged him to come back on annual basis,  but I am not sure he will.  Thomas C. Daleen Squibb, MD, [facility]  Electronically Signed    TCW/MedQ  DD: 05/31/2008  DT: 06/01/2008  Job #: [id number]

## 2010-09-08 NOTE — H&P (Signed)
[facility]  Patient:    [name], [name] Visit Number: [id number] MRN: [id number]          Service Type: OBV Location: 3W 0354 01 Attending Physician:  Katherine Roan Dictated by:   Rozanna Boer., M.D. Admit Date:  05/03/2001                           History and Physical  BRIEF HISTORY:  This 77 year old patient was admitted for a left upper pole renal mass.  He had a partial nephrectomy on April 14, 2001.  Pathology showed oncocytoma.  He was sent home with a left flank drain on April 19, 2001.  Bertram Millard. Dahlstedt, M.D., saw the patient twice this week in the office, the last was May 02, 2001.  He advanced the drain at that time. His wife called me earlier today.  Said he had no fever or chills but became quite dizzy and hypotensive.  She took his blood pressure and it was 74/50 tonight.  He was somewhat nauseated and came to the emergency room for evaluation.  His labs showed a white count of 7300, hematocrit of 38.4, electrolytes were normal.  His BUN and creatinine were 34 and 1.8 respectively.  Chest x-ray was clear.  EKG showed no acute changes.  He was admitted for probable dehydration and observation.  PAST MEDICAL HISTORY:  MEDICATIONS: 1. Nexium 40 mg q.d. 2. Cipro 500 mg p.o. b.i.d. 3. Altace 10 mg p.o. q.d. 4. Cardura 4 mg p.o. q.d. 5. Percocet 5/500 but he has not taken any for 3 days.  ALLERGIES:  No known drug allergies.  PREVIOUS OPERATIONS: 1. Angioplasty x 2, in 2001, and 2002. 2. A triple A repair in 1997 3. He has had bilateral cataract surgery in the past.  REVIEW OF SYSTEMS:  Nonsmoker, no recent chest pain or heart problems.  HE does use aerosol to help with his breathing.  No bowel movements for a couple of days.  He has been on stool softeners.  FAMILY HISTORY AND SOCIAL HISTORY:  Documented on the old chart.  PHYSICAL EXAMINATION:  VITAL SIGNS:  Temperature 95.7, pulse 97,  respirations 20, blood pressure 106/67.  He is a pale, weak, white male, in no acute distress.  SKIN:  Somewhat dry and he has nasal 02 that he has been using at home most of this week.  HEENT:  Otherwise clear.  HEART AND LUNGS:  Clear.  No murmurs.  ABDOMEN:  He has a recent scar on his left upper abdomen and a drain in his left flank that is draining very minimal fluids.  He has no masses or tenderness and his wound looks uninfected.  GU:  Reveals normal penis and scrotum.  Prostate is benign.  EXTREMITIES:  Negative for edema.  I removed his left flank drain and put on a dressing on it.  IMPRESSION: 1. Dehydration. 2. Hypotension. 3. Postoperative left partial nephrectomy April 14, 2001. 4. Significant cardiovascular disease.  RECOMMEND:  Admit for hydration and observation. Dictated by:   Rozanna Boer., M.D. Attending Physician:  Katherine Roan DD:  05/03/01 TD:  05/04/01 Job: [id number]

## 2010-09-08 NOTE — Op Note (Signed)
[facility]  Patient:    [name], [name] Visit Number: [id number] MRN: [id number]          Service Type: SUR Location: 3W 0373 01 Attending Physician:  Liborio Nixon Dictated by:   Bertram Millard. Dahlstedt, M.D. Proc. Date: 04/14/01 Admit Date:  04/14/2001   CC:         Daisey Must, M.D. [facility]  Bruce H. Swords, M.D. [facility]  Verl Dicker, M.D.   Operative Report  PREOPERATIVE DIAGNOSIS:  Left renal mass.  POSTOPERATIVE DIAGNOSES:  Left renal mass, frozen section diagnosis tumor.  PRINCIPLE PROCEDURE:  Left partial nephrectomy.  SURGEON:  Bertram Millard. Dahlstedt, M.D.  FIRST ASSISTANT:  Verl Dicker, M.D.  COMPLICATIONS:  None.  ESTIMATED BLOOD LOSS:  225 cc  URINARY OUTPUT:  300 cc  BRIEF HISTORY:  A 77 year old male who was found to have a left upper pole mass upon evaluation for epigastric and chest pain. A CT scan was performed showing a 2.5 cm mass. Metastatic survey was negative. The patient also had an MRI and renal scan. This showed mild left renal atrophy which splits 39% on the left and 61% on the right. He does have a preoperative creatinine of 1.7. He does have a history of abdominal aortic aneurysm repair for rupture.  The patient is seen by Dr. Wanda Plump and was counselled preoperatively regarding treatment options. These include watchful waiting, total versus partial nephrectomy. The risks and complications of the interventions have been discussed with the patient in detail. He understands these, and at this point is planned for a partial nephrectomy.  DESCRIPTION OF PROCEDURE:  The patient was administered preoperative IV antibiotics and taken to the operating room where general endotracheal anesthesia was induced. He was placed in the left lateral decubitus position and the bean bag was deflated. All extremities were padded appropriately. His left flank was prepped and draped. A twelfth rib incision was  made from the mid aspect of the rib inferiorly and medially in an oblique fashion. The subcutaneous tissues were divided and the rib resected. The 10 cc of 0.5% plain Marcaine was introduced in the area of the twelfth costal nerve. A small tear was made in the pleural tissue which was identified at this time. It was approximately 1 cm in size. The retroperitoneum was assessed and blunt dissection freed up the whole kidney with surrounding Gerotas fascia. The renal vessels were identified superiorly and medially and doubly circled with vessiloops. The upper pole of the kidney was identified and dissected free from the overlying adrenal gland. The perinephric fat was dissected through and we then identified the 2 1/2 to 3 cm lesion at the upper aspect of the kidney. The surrounding adipose tissue was dissected free from the capsule and sent with the specimen. Approximately 1 cm lateral to the base of the tumor, the renal capsule was scored with electrocautery. A blunt knife handle was used to dissect through the renal parenchyma once the renal artery had been clamped. Small arteries that were identified were then tied with 4-0 Vicryl ties. The renal mass was then removed in addition to about 25% of the kidney, the upper pole. We did come across two areas of obvious caliceal stricture. Once the tumor and surrounding renal parenchyma had been removed, the caliceal structures were closed using a running 4-0 Vicryl suture. Indigo carmine had been given to the patient previously and no leakage was seen of the indigo carmine. The entire opened kidney was then cauterized  with the argon beam coagulator. At this point, the clamp was loosened. There were only 12 minutes of clamp time on the renal artery. Small bleeders were again electrocoagulated with the argon beam. At this point, fiber and glue placed on Gelfoam was placed over the open wound of the kidney and pressure held for 15 minutes. Once the  pressure was released, we inspected the overlying Gelfoam for approximately 10 minutes with no bleeding seen. The adrenal was inspected and oozed a small amount. It was coagulated with the argon beam and Gelfoam was placed over top of this. It was hemostatic. The entire retroperitoneum was irrigated and no more bleeding was seen. The small pleural tear was sutured closed using a running 4-0 silk. A red rubber catheter was placed through this at that time. At this point, a drain was brought through the skin posterior and inferior to the incision and brought into the retroperitoneum and sutured to the skin using 3-0 nylon. Fascial closure was then used in three layers anatomically using a running #1 PDS. Prior to skin closure, positive pressure deep breath was given by anesthesia and over a water seal, the red rubber catheter was removed. At this point, skin closure was performed using clips. A dry sterile dressing was placed.  The patient tolerated the procedure well, was transferred to the PACU in stable condition after extubation. Estimated blood loss was 225 cc.  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 Dictated by:   Bertram Millard. Dahlstedt, M.D. Attending Physician:  Liborio Nixon DD:  04/14/01 TD:  04/15/01 Job: [id number]

## 2010-09-08 NOTE — Cardiovascular Report (Signed)
[facility]  Patient:    [name], [name]                            MRN: [id number] Proc. Date: 01/02/00 Adm. Date:  [id number] Disc. Date: [id number] Attending:  Daisey Must CC:         Valetta Mole. Swords, M.D. [facility]  Cardiac Catheterization Laboratory   Cardiac Catheterization  PROCEDURES PERFORMED:  Left heart catheterization with coronary angiography and left ventriculography.  INDICATIONS:  [name] is a 77 year old male with a history of a previous posterolateral myocardial infarction in May of 2000.  He was treated with primary angioplasty of the distal left circumflex.  Of note, he has severely ectatic and aneurysmal coronary arteries including an area of ectasia in the mid circumflex prior to the site of the infarct.  He has been having intermittent chest pain and was evaluated in the emergency room on August 21 after a prolonged episode of chest pain.  He was sent home and arranged for an outpatient Cardiolite done on August 31.  This showed inferior and posterolateral infarct with evidence of peri-infarct ischemia.  This was worse in comparison to previous nuclear study.  He was therefore scheduled for a elective cardiac catheterization.  DESCRIPTION OF PROCEDURE:  A 6 French sheath was placed in the right femoral artery.  The catheters utilized included a 6 [place], 6 [place] JR4 and 6 [place] angled pigtail.  Contrast was Omnipaque.  There were no complications.  CATHETERIZATION RESULTS:  HEMODYNAMICS:  Left ventricular pressure 128/14.  Aortic pressure 128/78. There was no aortic valve gradient.  LEFT VENTRICULOGRAM:  There is mild to moderate akinesis of the posterolateral wall.  Ejection fraction is calculated at 54%.  No mitral regurgitation.  CORONARY ARTERIOGRAPHY:  (Right dominant).  Coronaries are diffusely ectatic with aneurysmal changes.  Left main:  Short but free of angiographic disease.  Left anterior descending:  The  left anterior descending artery has a 20% stenosis at its ostium.  The proximal vessel has mild ectasia followed by a 30% stenosis in the proximal and mid vessel at the origin of the first diagonal branch.  The first diagonal is normal in size and has a 25% stenosis in it.  There is a small second diagonal and the distal LAD is small.  Left circumflex:  The left circumflex gives rise to a small OM-1, a large branching OM-2 and a small OM-3.  Just distal to the OM-3 in the distal circumflex there is 100% occlusion at the previous PTCA site within the area of ectasia.  There is a normal sized left posterolateral branch that fills by right to left collaterals.  There does appear to be thrombus in the distal circumflex at the site of occlusion.  The second obtuse marginal was a large branch which has a 30% stenosis at its origin followed by an area of ectasia. The third obtuse marginal is small and has a 50% stenosis at its origin.  Right coronary artery:  The right coronary is a dominant vessel and has severe diffuse ectasia and aneurysmal changes extending from the proximal to distal vessel.  The distal right coronary artery gives rise to a large posterior descending artery which has a diffuse 25% stenosis and a normal sized posterolateral branch which has a diffuse 25% stenosis.  IMPRESSION: 1. Mildly decreased left ventricular systolic function. 2. 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 3. One-vessel atherosclerotic coronary artery disease with 100% occlusion in    the distal circumflex in an ectatic segment prior to a normal sized    posterolateral branch.  This is at the site of a previous occlusion which    was responsible for myocardial infarction in May of last year.  PLAN:  The patient has been asymptomatic since his presentation to the emergency room on August 21.  Based on the significant amount of traumas in this vessel, the ectasia  and low probability of long-term patency, it appears the best option is for medical therapy.  There are good collaterals supplying the distal circumflex arising from the distal right coronary artery. The patient therefore will be managed medically.  Specifically we will add Plavix to his aspirin and Plavix will be continued indefinitely, because of the aneurysmal changes of his coronary arteries. DD:  01/02/00 TD:  01/03/00 Job: [id number]

## 2010-09-08 NOTE — Consult Note (Signed)
[name], [name]                             ACCOUNT NO.:  [id number]   MEDICAL RECORD NO.:  [id number]                   PATIENT TYPE:  INP   LOCATION:  1824                                 FACILITY:  [facility]   PHYSICIAN:  Charlton Haws, M.D. [facility]              DATE OF BIRTH:  [birth year]   DATE OF CONSULTATION:  07/20/2002  DATE OF DISCHARGE:                                   CONSULTATION   HISTORY AND PHYSICAL:  The patient is a 77 year old patient admitted by Dr.  Debby Bud for chest pain.  He required consultation by [place].  The patient gives a  longstanding history of aneurysmal and coronary artery disease.  Back in  1999, he had a ruptured aneurysm.  He was in [place] at the time and was  transferred to [facility] and miraculously survived a prolonged operation by Dr.  Tanda Rockers without any particular sequelae.  He has known coronary artery  disease and had inferior posterior MI in May of 2000.  He had subsequently  stenting of the distal circumflex.  Repeat catheterization in 2001 showed  that the stent was occluded with right-to-left collaterals, and medical  therapy was warranted.  Of note, the patient has fairly tortuous and ectatic  arteries, and Dr. Gerri Spore felt he should be placed on Plavix indefinitely.  At the time, he had 50% lesion in the third obtuse marginal branch and  besides ectasia in the right artery, no critical disease there in the LAD.  EF was mildly reduced.  He apparently had a Cardiolite study in October  which did not precipitate any change in medical therapy.  He has been having  increasing chest pain since Thursday.  The pain is intermittent.  He does  have a history of reflux and sometimes he cannot tell the difference between  this and his angina.  He has had some relief with Maalox and has been taking  his Nexium twice a day now.   REVIEW OF SYSTEMS:  Otherwise remarkable for some fatigue.   MEDICATIONS:  Listed on the order sheet.   SOCIAL HISTORY:  The  patient is Producer, television/film/video.  He used to work for [employer].  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   PHYSICAL EXAMINATION:  VITAL SIGNS:  Blood pressure 128/70, pulse 70 and  regular.  LUNGS:  Mild expiratory wheezes.  NECK:  Carotids are normal.  CARDIAC:  S1 and S2 with normal heart sounds.  ABDOMEN:  He has a midline scar, and his belly is not tender.  His abdominal  aorta is not palpable.  EXTREMITIES:  Distal pulses are intact with no edema.   LABORATORY DATA:  Creatinine 1.5.  Initial CPK was 244 with MB of 6.2.  Troponin was 0.19.   EKG does not show acute changes.   IMPRESSION:  The patient has recurrent chest pain and  known coronary artery  disease.  Further noninvasive workup would probably not be helpful.  He has  collaterals from right to left of the distal circumflex and would likely  have abnormalities from that and his previous myocardial infarction.  He has  a difficult time telling the difference between his gastrointestinal  symptoms and his cardiac symptoms.  It has been three years since he has  been studied, and he has now an ectatic and diseased vessels with residual  50% obtuse marginal #3.  I talked to the patient at length and recommended  repeat catheterization to him.  We will place him on heparin and continue  his Plavix and aspirin.  Unfortunately, we will not be able to do his heart  catheterization until Wednesday.  If his CPKs bump further, we can try to  reconsider this.   Since he has never had his carotids looked at, I think it would be useful to  do a carotid duplex since he has known aneurysmal disease in abdomen and  coronaries.   Further recommendations will be based on the results of his heart  catheterization on Wednesday.                                               Charlton Haws, M.D. [facility]    PN/MEDQ  D:  07/20/2002  T:  07/20/2002  Job:  [id number]

## 2010-09-08 NOTE — Assessment & Plan Note (Signed)
[facility]                              CARDIOLOGY OFFICE NOTE   NAME:[name], [name]                          MRN:          [id number]  DATE:11/09/2005                            DOB:          1930/05/29   [name] returns today.  He was seen in November for some atypical chest  pain.  Stress Myoview at that time showed EF of 47% with nonreversible  inferior lateral defect with minimal peri-infarct ischemia.   He is having no chest pain at present.   He quit taking lovastatin because he could not afford it.  He also said that  his cholesterol was never really high.   See note from March 16, 2006 for complete, thorough problem list.   MEDICATIONS:  1.  Proscar 5 mg a day.  2.  Cardura 8 mg a day.  3.  Aspirin 81 mg.  4.  Lisinopril 20 mg a day.  5.  Omeprazole 20 mg a day.   PHYSICAL EXAMINATION:  VITAL SIGNS:  Blood pressure today is 168/78.  He  says this is extremely unusual.  It is usually under good control.  Heart  rate 63.  Weight is 212.  NECK:  Carotids are full without bruits.  There is no JVD.  LUNGS:  Clear.  HEART:  Regular rate and rhythm.  A soft systolic murmur at the apex and  upper left sternal border.  ABDOMEN:  Protuberant with good bowel sounds.  EXTREMITIES:  No edema.  Pulses are present.   EKG shows sinus bradycardia with a first-degree AV block with 4-0  progression across the anterior precordium.  This is stable.   ASSESSMENT/PLAN:  [name] is stable from a cardiovascular standpoint.  It  is unfortunate he cannot afford his lovastatin or understand the importance.  We will see him back in a year.                               Thomas C. Daleen Squibb, MD, [facility]   TCW/MedQ  DD:  11/09/2005  DT:  11/09/2005  Job #:  [id number]   cc:   Valetta Mole. Swords, MD

## 2010-09-08 NOTE — H&P (Signed)
[name], [name]                             ACCOUNT NO.:  [id number]   MEDICAL RECORD NO.:  [id number]                   PATIENT TYPE:  INP   LOCATION:  1824                                 FACILITY:  [facility]   PHYSICIAN:  Jonathan Gess. [name], M.D. [facility]         DATE OF BIRTH:  [birth year]   DATE OF ADMISSION:  07/20/2002  DATE OF DISCHARGE:                                HISTORY & PHYSICAL   PHYSICIANS:  1. Cardiologist [name], M.D. [facility].  2. Primary care physician [name], M.D. [facility].  3. Pulmonary [name], M.D. [facility].   CHIEF COMPLAINT:  Chest pain.   HISTORY OF PRESENT ILLNESS:  The patient is a 77 year old married white male  with a history of CAD with remote MI. The patient has a history of single  vessel CAD, status post PTCA with stent x2 in 2001 and 2002.  The patient  has mild LV dysfunction with an ejection fraction of 54%. The patient has  been seen on multiple occasions for chest pain that was thought to be  noncardiac. He last had a Cardiolite study in October 2003 at [facility], which, per the patient, was negative. He currently is on aspirin,  Plavix, Cardura, Nexium and Proscar.   The patient reports onset on July 16, 2002, of substernal chest pain  intermittently which he describes as sharp in nature of short duration. He  had no associated symptoms, specifically diaphoresis or shortness of breath.  The pain is not exertional. He is not taking nitroglycerin for this  discomfort.   The patient's continued pain was worrisome to him and he presented to [facility] emergency department. Initial evaluation revealed a normal  EKG, but his cardiac enzymes were borderline. Because of his history and  ambiguous cardiac enzymes, he is now admitted to rule out MI versus evaluate  for unstable angina. Given his history and anatomy, he may  require followup  cardiac catheterization.   PAST SURGICAL HISTORY:  1. Abdominal aortic  aneurysm repair in 1997.  2. 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  3. Status post PTCA in 2001 and 2002 as noted.  4. He had bilateral cataract surgery in the past.   PAST MEDICAL HISTORY:  1. Hypertension.  2. Benign prostatic hypertrophy.  3. History of tobacco use, quit  smoking  x5 years.  4. History of nephrolithiasis.  5. History of GERD.  6. History of atherosclerotic vascular disease peripherally and coronary.  7. History of COPD secondary to tobacco abuse.   MEDICATIONS ON ADMISSION:  1. Nexium 40 mg b.i.d.  2. Proscar 5 mg b.i.d.  3. Cardura 4 mg daily.  4. Aspirin daily.  5. Plavix daily.   SOCIAL HISTORY:  The patient is married. He has 1 son. 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  REVIEW OF SYSTEMS:  The patient has had a headache for several days. He has  increasing shortness of breath and recently saw [name] for  evaluation, who thought this may in part be GERD. The patient has no  musculoskeletal complaints, no GI complaints. GU is stable.   PHYSICAL EXAMINATION:  VITAL SIGNS:  Temperature 98.5, blood pressure  129/79, heart rate 92, respirations 20, O2 saturations 92% on room air.  GENERAL:  This is a pleasant white male, younger than his stated age in no  acute distress.  HEENT:  Normocephalic, atraumatic. Conjunctivae and sclerae were clear.  Oropharynx without lesions.  NECK:  Supple without thyromegaly.  LYMPH:  No adenopathy was noted in the cervical, supraclavicular region.  CHEST:  He has had no CVA tenderness. He had good breath sounds. He does  have end expiratory wheezes.  CARDIOVASCULAR:  2+ radial pulse. He had a regular rate and rhythm without  murmurs, no JVD was noted. He had no carotid bruits.  ABDOMEN:  Obese. Positive bowel sounds in all four quadrants. He had a well  healed midline surgical scar.  RECTAL AND GU:  Examinations are deferred to Dr. Cato [name].   NEUROLOGIC:  Examination was nonfocal.   LABORATORY DATA:  A 12-lead EKG revealed normal sinus rhythm with no acute  changes. A chest x-ray is not available.   Hemoglobin 15.1, hematocrit 44, white count 7500, normal differential,  platelet count 148,000. Chemistries:  Sodium 141, potassium 4, chloride 111,  CO2 20, BUN 23, creatinine 1.5, glucose 123.CK  244, MB 6.2 with a relative  index of 2.5, troponin I 0.19.   ASSESSMENT AND PLAN:  1. Cardiovascular. The patient with a history of single vessel disease with     myocardial infarction in the past and 2 percutaneous transluminal     coronary angioplasties. He now presents with atypical chest pain and     borderline cardiac enzymes. Given his high risk I will now admit to rule     out myocardial infarction versus unstable angina. Plan:  He will be     admitted to telemetry. He will be continued on aspirin, Plavix. Will     start Lopressor 25 b.i.d. Will start heparin IV. We will use sublingual     nitroglycerin p.r.n. Dr. Eden [name] has been notified for cardiology and a     consult has been requested.  2. Gastrointestinal.  The patient with a history of gastroesophageal reflux     disease. This may be the origin of his discomfort.  Plan:  Will continue     proton pump inhibitors with Protonix 40 mg b.i.d.  3. Pulmonary. The patient with a history of tobacco abuse and probable     chronic obstructive pulmonary disease. He     does have end expiratory wheezing on examination. Plan:  Will continue     oxygen. Will start the patient on Advair 100/50, 1 inhalation q. a.m.     and q.h.s.  4. Benign prostatic hypertrophy. Will continue the patient's home     medications.                                               [name], M.D. [facility]    MEN/MEDQ  D:  07/20/2002  T:  07/20/2002  Job:  [id number]   cc:   Jonathan Mole.  [name], M.D. [facility]   [name], M.D. [facility]  [facility]

## 2010-09-08 NOTE — Discharge Summary (Signed)
[facility]  Patient:    [name], [name] Visit Number: [id number] MRN: [id number]          Service Type: OBV Location: 3W 0354 01 Attending Physician:  Katherine Roan Dictated by:   Rozanna Boer., M.D. Admit Date:  05/03/2001 Discharge Date: 05/04/2001                             Discharge Summary  DISCHARGE DIAGNOSES: 1. Dehydration. 2. Hypotension. 3. Postoperative left partial nephrectomy. 4. Coronary artery disease.  HISTORY OF PRESENT ILLNESS:  This 77 year old patient was admitted with a left upper pole renal mass.  Had a left partial nephrectomy on 04/14/01.  Path showed oncocytoma.  He was sent home with left flank drain on 04/19/01.  Saw Dr. Retta Diones twice in the office last week, most recently was 05/02/01, when he advanced the drain.  He had no fever or chills, but became quite dizzy, hypotensive, his wife took his pressure and it was 74/50, and he was requiring oxygen nasally.  He was somewhat nauseated as well, but no pain or fever.  He came to the emergency room for evaluation.  His labs were normal with a white blood cell count of 7300, hematocrit 38.4.  Electrolytes were normal.  His BUN was 34, creatinine was 1.8.  Chest x-ray showed no acute disease.  EKG showed no acute changes.  He was admitted for probable dehydration.  ADMISSION MEDICATIONS: 1. Nexium 40 mg q.d. 2. Cipro 500 mg b.i.d. 3. Altace 10 mg q.d. 4. Cardura 4 mg q.d. 5. Percocet, but he had not taken any for 3 days.  PAST SURGICAL HISTORY: 1. He had previous angioplasty in 2001 and 2002. 2. Abdominal aortic aneurysm repair in 1997.  HOSPITAL COURSE:  While he was in the hospital he remained afebrile.  The drain was removed in the emergency room on his left flank, and he had no further pain or fever.  With hydration, he seemed to perk up and his urine output was good.  His IV was discontinued.  He was allowed to ambulate, take a shower,  and 2 or 3 hours later was discharged home.  FOLLOWUP:  He will call Dr. Retta Diones in the morning to inform him of his progress, but was to resume his normal diet, continue his same preoperative medications.  CONDITION ON DISCHARGE:  He was discharged from observation in satisfactory ambulatory condition on a regular diet. Dictated by:   Rozanna Boer., M.D. Attending Physician:  Katherine Roan DD:  05/04/01 TD:  05/05/01 Job: [id number]

## 2010-09-08 NOTE — Cardiovascular Report (Signed)
[name], [name]                             ACCOUNT NO.:  [id number]   MEDICAL RECORD NO.:  [id number]                   PATIENT TYPE:  INP   LOCATION:  2029                                 FACILITY:  [facility]   PHYSICIAN:  Veneda Melter, M.D. [facility]               DATE OF BIRTH:  January 20, 1931   DATE OF PROCEDURE:  DATE OF DISCHARGE:                              CARDIAC CATHETERIZATION   PROCEDURES PERFORMED:  1. Left heart catheterization.  2. Selective ventriculogram.  3. Selective coronary angiography.  4. Abdominal aortogram.   DIAGNOSES:  1. Coronary atherosclerotic disease.  2. Normal left ventricular systolic function.  3. Peripheral vascular disease, status post abdominal aortic aneurysm     repair.   HISTORY:  The patient is a 77 year old gentleman with a known history of  coronary artery disease as well as abdominal aortic aneurysm repair who  presents with substernal chest discomfort.  The patient has known occlusion  of a fourth marginal branch of the left circumflex artery, and has been  treated medically.  He was admitted to the hospital and subsequently ruled  in for a small non-Q myocardial infarction.  He is referred for further  cardiac assessment.   TECHNIQUE:  Informed consent was obtained.  The patient was brought to the  catheterization lab.  A 6-French sheath was placed in the right femoral  artery using he modified Seldinger technique.  Due to slight resistance with  advancing catheters and wire, Exchange technique was used.  Initial attempts  were made to engage the left coronary artery with a JL-4 catheter.  However,  this proved short due to dilatation of the ascending aortic root, and a JL-5  catheter was used.  Selective angiography was performed.  A JR-4 catheter  was used to engage the right coronary artery.  Subsequently, a 6-French  pigtail catheter was advanced to the left ventricle, and a left  ventriculogram was performed using power injection of  contrast.  Pigtail  catheter was brought back into the descending aorta and abdominal aortogram  was performed using power injection of contrast.  At the termination of the  case, the catheters and sheaths were removed, and manual pressure was  applied until adequate hemostasis was achieved.   The patient tolerated the procedure well and was transferred to the floor in  stable condition.   FINDINGS:  1. Left main trunk:  Mild irregularities.  2. Left anterior descending:  A medium-caliber vessel that provides two     diagonal branches, and the LAD is aneurysmally dilated in the proximal     segment.  The remainder of the vessel has mild diffuse of 30%.  3. Left circumflex artery:  This is a medium-caliber vessel that provides     four marginal branches.  The AV circumflex has moderate disease of 30-40%     in the proximal segment, and is then 100%  occluded after the third     marginal branch.  4. The first marginal branch is a large-caliber vessel that is aneurysmally     dilated in the proximal segment, and then bifurcates distally.  It     exhibits mild disease of 10-20%.  5. The second and third marginal branches are small caliber vessels.  They     have moderate, diffuse disease of 70-80% in the proximal segment.  The     first marginal branch, as noted, is occluded proximally and fills via     collaterals from the right coronary artery and is under-filled.  It     exhibits moderate disease of 30-40%.  6. Right coronary artery is dominant.  A large-caliber vessel that provides     a posterior descending artery and posterior ventriculogram in its     internal segment.  The right coronary artery is aneurysmally dilated     along its entire segment.  The distal branch vessels have mild disease of     30%.  7. Left ventricle.  Normal end systolic and end diastolic dimensions.     Overall left ventricular function is well preserved.  Ejection fraction     is approximately 55%.  There  is severe hypokinesis of the inferior     posterior wall.  No mitral regurgitation is noted.  The ascending aorta     does appear to be aneurysmally dilated.  LV pressure is 130/15.  Aorta is     130/75.  LVEDP is 25.  8. Abdominal aorta is of normal caliber.  There is moderate tortuosity.     There is evidence of a previous repair with only mild narrowing of 30% at     the proximal anastomosis.  9. The renal arteries are single and patent bilaterally.  10.      The iliac arteries are patent in the segment of the grafts.  The     distal anastomosis in the external segments are patent with mild     irregularities.   ASSESSMENT/PLAN:  The patient is a 77 year old gentleman with single-vessel  coronary artery disease involving the left circumflex artery.  He has some  progression of disease involving two small marginal branches.  Overall, left  ventricular function is well preserved, and continued medical therapy will  be recommended.  He has dilatation of the ascending artery, and further  assessment with an imaging study may be appropriate.                                               Veneda Melter, M.D. [facility]    NG/MEDQ  D:  07/22/2002  T:  07/22/2002  Job:  [id number]   cc:   Salvadore Farber, M.D. [facility]   Rosalyn Gess. Norins, M.D. [facility]

## 2010-09-08 NOTE — Discharge Summary (Signed)
[place]  Patient:    [name], [name]                            MRN: [id number] Adm. Date:  [id number] Attending:  Lewayne Bunting Dictator:   Delight Stare, M.D. CC:         Valetta Mole. Swords, M.D. [facility]  Daisey Must, M.D. [facility]   Discharge Summary  ADMITTING PHYSICIAN:  Doylene Canning. Ladona Ridgel, M.D.  DISCHARGING PHYSICIAN:  Daisey Must, M.D.  SUMMARY OF HISTORY:  Mr. Golphin is a 77 year old white male who approximately nine days ago began having chest discomfort in the late afternoon, which lasted for seconds (2/10 in severity).  He had become very anxious, especially in regards to his mothers illness (who is in intensive care and is not expected to pull through).  As he continued to experience chest discomfort he became more anxious, and thus came to the emergency room.  He does have a history of coronary artery disease.  In September 2001 he did have a Cardiolite which showed scar and peri-infarct ischemia.  A catheterization at that time showed an EF of 54%, posterolateral akinesis, distal 100% circumflex, posterior to OM1 with right to left collaterals.  The RCA was dominant, with severe diffuse ectasia; he was treated medically.  History is also notable for hypertension, COPD, TERP, BPH, abdominal aortic aneurysm rupture and repair in 1997, remote tobacco use.  LABORATORY DATA:  Enzymes and EKGs were negative for myocardial infarction. HOSPITAL COURSE:  Dr. Gerri Spore reviewed enzymes and EKGs; felt that he could be discharged home.  He felt that his discomfort was due to anxiety and related to his mothers illness.  History is as previously.  DISPOSITION:  DISCHARGE MEDICATIONS: He is discharged on his home medications.  These include: 1. Coated aspirin 325 mg q.d. 2. Plavix 75 mg q.d. 3. Altace 10 mg q.d. 4. Prevacid 30 mg q.d. 5. Sublingual nitroglycerin p.r.n. 6. Inhaler. 7. Multivitamin.  DIET:  Low-salt, low-fat, low  cholesterol.  FOLLOW-UP:  He is to call our office in the morning to arrange a follow-up appointment with Dr. Gerri Spore.  He was also instructed that if he continued to have problems with chest discomfort, that he should return to the emergency room. 8. Vitamin E 400 I.U. q.d. DD:  03/07/00 TD:  03/07/00 Job: [id number]

## 2010-09-08 NOTE — Discharge Summary (Signed)
[name], [name]                             ACCOUNT NO.:  [id number]   MEDICAL RECORD NO.:  [id number]                   PATIENT TYPE:  INP   LOCATION:  2029                                 FACILITY:  [facility]   PHYSICIAN:  Salvadore Farber, M.D. [facility]         DATE OF BIRTH:  Oct 09, 1930   DATE OF ADMISSION:  07/20/2002  DATE OF DISCHARGE:  07/23/2002                           DISCHARGE SUMMARY - REFERRING   SUMMARY OF HISTORY:  [name] is a 77 year old white male who was admitted  on 3/29 by Dr. Debby Bud. Dr. Debby Bud states that the patient began describing  chest discomfort on 07/16/02 which occurred intermittent and lasted for a  short duration. The patient described this as sharp without associated  symptoms. The patient felt that the discomfort was not exertional, and he  has not tried nitroglycerin for the discomfort. He presented to the [facility] emergency room because the patient was worried about the discomfort.  Initial evaluation showed normal EKG, and his enzymes were borderline. Thus,  he was admitted, and cardiac was consulted.   PAST MEDICAL HISTORY:  1. His history was notable for coronary artery disease. He had angioplasty     with stenting x2 in 2001 and 2002. His LV function is approximately 54%.     His last Cardiolite in 2003 per the patient was unremarkable.  2. He also survived an acute abdominal aortic aneurysm rupture and repair in     1997.  3. Left partial nephrectomy in 2003 for a benign __________ tumor.  4. Bilateral cataract surgery.  5. Hypertension.  6. Nephrolithiasis,  7. GERD.  8. COPD/remote tobacco abuse.  9. Possible history of sleep apnea aggravated by beta blocker.   LABORATORY DATA:  Admission H and H was 15.1 and 44.0, normal indices.  Platelets 148, WBCs 7.5. PT 12.7, PTT 28. Sodium 141, potassium 4.0, BUN 23,  creatinine 1.5, glucose 123. Subsequent chemistries and hematologies were  essentially unremarkable. It was noted that his LFTs  were within normal  limits on 3/30. His initial CK total was 244 with MB of 6.2, relative index  of 2.6, troponin of 0.19. Second CK total was 174 with MB of 4.8 and  relative index of 2.8, troponin 0.24. Third CK total was 166, MB 4.5,  relative index 2.7, troponin 0.21. Chest x-ray showed borderline  cardiomegaly. No active disease. EKGs on admission showed normal sinus  rhythm, normal axis, nonspecific ST-T wave changes.   HOSPITAL COURSE:  [name] was admitted to [facility] hospital initially by  Dr. Cato Mulligan. Dr. Cato Mulligan asked Dr. Eden Emms for cardiac consultation. This was  performed on 3/29. Dr. Eden Emms agreed with heparin and continuing his home  medications. He also started him on Lopressor. He felt with his mild  increase of troponins that he had probably suffered from a non-Q-wave  myocardial infarction. Given this information, it was felt that he should  undergone cardiac catheterization. Carotid Dopplers were performed and did  not reveal any ICA stenoses. He had vertebral flow antegrade. It was noted  that his triglycerides were slightly elevated, and his HDL was low; thus,  Lipitor was started. Cardiac research saw and placed the patient in the  Acuity trial. Hemoglobin A1c was performed to evaluate a slight  hyperglycemia. This was within normal limits at 5.5. It was also noted that  his TSH was within normal limits at 1.627. On 07/22/02, Dr. Chales Abrahams performed  left heart catheterization. According to Dr. Urban Gibson progress note, he had  mild left main irregularities, and the LAD had a 30% proximal aneurysmal  area. The circumflex had four OMs. The first OM was slightly aneurysmal, 70%  lesion in the OM3, 100% OM4. The RCA also in the proximal mid distal portion  was slightly aneurysmal. The PDA and PD had mild disease. His EF was 55%  with severe basilar inferior hypokinesis. Aortogram also showed mild disease  with a tortuous aorta. Dr. Chales Abrahams, after review, recommended medical   management for small-vessel coronary artery disease. Dr. Chales Abrahams also noted  that he had a dilated ascending aorta. By 07/23/02, post sheath removal and  bed rest, he was ambulating without difficulty. Catheterization site had a  slight amount of ecchymosis. After review, Dr. Samule Ohm felt that he could be  discharged home.   DISCHARGE DIAGNOSES:  1. Non-Q-wave myocardial infarction.  2. Hyperlipidemia.  3. Hyperglycemia, probably associated with myocardial infarction.  4. Epistaxis related to Integrilin use.  5. History as previously mentioned.   DISPOSITION:  [name] is discharged home.   MEDICATIONS:  His new medications include:  1. Lipitor 20 mg q.h.s.  2. Lopressor 50 mg half a tablet b.i.d.   He was asked to continue his home medications which include:  1. Coated aspirin 81 mg q.d.  2. Plavix 75 mg q.d.  3. Cardura 8 mg q.d.  4. Proscar 5 mg q.h.s.  5. Nexium 40 mg b.i.d.  6. Advair one puff b.i.d.  7. Sublingual nitroglycerin 0.4 as needed for chest discomfort.   ACTIVITY:  He was advised no lifting, driving, sexual activity, or heavy  exertion until seen by the physician.   DIET:  Maintain low salt, fat, and cholesterol diet.   FOLLOW UP:  If he has any problems with his catheterization site, he will  call our office. He will follow up with Dr. Samule Ohm on Monday, 4/12, at 12  p.m. At the time of followup with Dr. Samule Ohm, arrangements should be made  for fasting lipids and LFTs in approximately six to eight weeks since  Lipitor was initiated. Also, to question the patient in regards to beta  blocker tolerance as the patient states at the time of discharge that he has  had some problems with sleep apnea that he feels is related to beta blocker  medications. He will also arrange a followup appointment with Dr. Sherene Sires as  the patient states that Dr. Sherene Sires felt that he may be able to come off of his  Advair.    Joellyn Rued, P.A. [facility]                    Salvadore Farber, M.D.  [facility]    EW/MEDQ  D:  07/23/2002  T:  07/24/2002  Job:  [id number]   cc:   Valetta Mole. Swords, M.D. [facility]   Casimiro Needle B. Sherene Sires, M.D. [facility]

## 2010-09-08 NOTE — Discharge Summary (Signed)
[facility]  Patient:    [name], [name] Visit Number: [id number] MRN: [id number]          Service Type: EMS Location: [place] Attending Physician:  Armanda Heritage Dictated by:   Bertram Millard. Dahlstedt, M.D. Admit Date:  05/13/2001 Discharge Date: 05/13/2001   CC:         Daisey Must, M.D. [facility]  Bruce H. Swords, M.D. [facility]  Verl Dicker, M.D.   Discharge Summary  PRIMARY DIAGNOSES: 1. Oncocytoma of the left kidney. 2. Chronic obstructive pulmonary disease. 3. Hypertension. 4. Coronary artery disease. 5. Diaphragmatic hernia. 6. Gastroesophageal reflux disease. 7. Benign prostatic hypertrophy.  BRIEF HISTORY:  A 77 year old male who was recently found to have a 2.5-cm left upper pole mass.  That was found on a CT scan that was done for evaluation of epigastric pain.  He has had negative metastatic survey.  The patient presents at this time for a possible left partial nephrectomy.  He was seen preoperatively and a discussion was held with the patient regarding treatment options including partial and radical nephrectomy, as well as risks and complications.  He understands these and desires to proceed.  HOSPITAL COURSE:  The patient was admitted directly to the operating room where he underwent left partial nephrectomy.  He tolerated this procedure well.  Postoperatively, he had a fair amount of gas pain and nausea, which was treated adequately with H2 blockers.  He was treated adequately with PCA narcotics.  A drain was left in postoperatively and had persistent moderate drainage.  This was found to be urine and the drain was left in upon discharge.  The patient was discharged home on postoperative day #4.  At that time, he was healing adequately.  He was taught drain management.  He was on a regular diet.  DISCHARGE MEDICATIONS: 1. Tylox 1 p.o. q.4h. p.r.n. pain. 2. Advair Diskus twice a day. 3. Albuterol inhaler. 4. Duratuss. 5. His  usual home medications.  WOUND CARE:  He was told wound management.  FOLLOWUP:  He will be followed up in approximately one week by my office.  We will call to set up an appointment.  ACTIVITY:  Activity restrictions were discussed.  FINAL PATHOLOGY:  Oncocytoma, a benign tumor. Dictated by:   Bertram Millard. Dahlstedt, M.D. Attending Physician:  Armanda Heritage DD:  05/27/01 TD:  05/28/01 Job: [id number]

## 2010-09-08 NOTE — H&P (Signed)
[facility]  Patient:    [name], [name] Visit Number: [id number] MRN: [id number]          Service Type: SUR Location: 3W 0373 01 Attending Physician:  Liborio Nixon Dictated by:   Bertram Millard. Dahlstedt, M.D. Admit Date:  04/14/2001   CC:         Daisey Must, M.D. [facility]  Bruce H. Swords, M.D. [facility]   History and Physical  REASON FOR ADMISSION:  Left renal mass.  BRIEF HISTORY:  This is a 77 year old male with 2.5 cm left upper pole mass identified on a CT scan done upon evaluation for epigastric pain.  The patient has a negative metastatic survey.  MRI revealed solid lesion without evidence of extracapsular spread.  Renal scan revealed 39/61% function comparing the left to the right kidney.  The patient presents at this time for possible left partial nephrectomy as primary treatment for this tumor.  Risks and complications have been discussed with the patient at length, and are understood.  PAST MEDICAL HISTORY: 1. BPH with a history of previous ultrasound biopsy in 1998.  That was    negative. 2. ASCVD.  Status post aortic aneurysm repair after a rupture by Dr. Mills Koller in 1997. 3. Hypertension. 4. History of kidney stones. 5. GERD/hiatal hernia. 6. Coronary artery disease.  PRIOR SURGERY: 1. Status post angioplasty x 2 in 2001 and 2002 by Loraine Leriche Pulsipher. 2. Status post AAA repair in 1997. 3. Status post cataract surgery bilaterally.  MEDICATIONS: 1. Altace 10 mg a day. 2. Nexium 40 mg a day. 3. Cardura 4 mg a day. 4. Advair Diskus twice daily.  ALLERGIES:  No known drug allergies.  SOCIAL HISTORY:  Married.  The couple has one son.  He quit smoking in February of 1999.  He had a 50-pack-year history before that.  He drinks a fair amount of caffeine.  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  FAMILY HISTORY:  Significant for hypertension, kidney disease, kidney stones.  REVIEW OF SYSTEMS:   Positive for erectile dysfunction, urinary frequency, and lower urinary tract symptoms, occasional epigastric pain.  PHYSICAL EXAMINATION:  GENERAL:  Healthy-appearing middle-aged male.  VITAL SIGNS:  Unremarkable.  HEENT:  Normal.  NECK:  Supple without thyromegaly or adenopathy.  CHEST:  Clear bilaterally.  HEART:  Regular rate and rhythm without rub, gallop, or murmur.  ABDOMEN:  Flat, soft, nondistended, nontender.  No hepatosplenomegaly or masses were present.  No inguinal hernias were present.  Well-healed midline incision consistent with prior AAA repair.  No incisional hernia.  GENITALIA:   Fallus circ without lesions, no fibrotic areas or plaque. Testicles normal shape, size, and consistency, centered bilaterally.  Cord, epididymal structures unremarkable.  RECTAL:  Normal anal sphincter tone.  Gland was enlarged and nonsuspicious.  EXTREMITIES:  Without cyanosis, clubbing, or edema.  LABORATORY STUDIES:  On admission normal, except for creatinine 1.7.  Chest x-ray clear.  Preoperative cardiac clearance was performed.  IMPRESSION: 1. Left renal mass, most likely renal cell carcinoma. 2. Hypertension. 3. Coronary artery disease. 4. History of AAA repair for ruptured aortic aneurysm.  PLAN:  Probable left partial nephrectomy. Dictated by:   Bertram Millard. Dahlstedt, M.D. Attending Physician:  Liborio Nixon DD:  04/14/01 TD:  04/14/01 Job: [id number]

## 2010-09-11 ENCOUNTER — Other Ambulatory Visit: Payer: Self-pay | Admitting: *Deleted

## 2010-09-11 MED ORDER — LORAZEPAM 1 MG PO TABS: 1.0000 mg | ORAL_TABLET | Freq: Every day | ORAL | Status: DC | PRN

## 2010-09-17 ENCOUNTER — Telehealth: Payer: Self-pay | Admitting: Internal Medicine

## 2010-09-17 NOTE — Telephone Encounter (Signed)
[name], last seen in march for worsening dyspnea. He was supposed to have had PFTs and then fu with me. Please ensure this

## 2010-09-21 ENCOUNTER — Other Ambulatory Visit: Payer: Self-pay | Admitting: *Deleted

## 2010-09-21 MED ORDER — HYDROCODONE-ACETAMINOPHEN 5-325 MG PO TABS: 1.0000 | ORAL_TABLET | Freq: Every day | ORAL | Status: DC | PRN

## 2010-09-28 NOTE — Telephone Encounter (Signed)
[email or web address]. Jennifer Castillo, CMA   

## 2010-10-03 NOTE — Telephone Encounter (Signed)
Pt set for PFT on July 2nd at 12 and then ROV after with MR. Carron Curie, CMA

## 2010-10-23 ENCOUNTER — Encounter: Payer: Self-pay | Admitting: Internal Medicine

## 2010-10-23 ENCOUNTER — Ambulatory Visit (INDEPENDENT_AMBULATORY_CARE_PROVIDER_SITE_OTHER): Payer: Medicare PPO | Admitting: Internal Medicine

## 2010-10-23 VITALS — BP 100/62 | HR 92 | Temp 98.1°F | Ht 75.0 in | Wt 204.0 lb

## 2010-10-23 DIAGNOSIS — R05 Cough: Secondary | ICD-10-CM

## 2010-10-23 DIAGNOSIS — R0602 Shortness of breath: Secondary | ICD-10-CM

## 2010-10-23 DIAGNOSIS — R0609 Other forms of dyspnea: Secondary | ICD-10-CM

## 2010-10-23 DIAGNOSIS — J449 Chronic obstructive pulmonary disease, unspecified: Secondary | ICD-10-CM

## 2010-10-23 DIAGNOSIS — J8410 Pulmonary fibrosis, unspecified: Secondary | ICD-10-CM

## 2010-10-23 DIAGNOSIS — J984 Other disorders of lung: Secondary | ICD-10-CM

## 2010-10-23 DIAGNOSIS — R911 Solitary pulmonary nodule: Secondary | ICD-10-CM

## 2010-10-23 NOTE — Progress Notes (Signed)
PFT done today. 

## 2010-10-23 NOTE — Progress Notes (Signed)
Subjective:    Patient ID: [name], male    DOB: [birth year], 77 y.o.   MRN: [id number]  HPI OV 10/23/2010: [name] has gold stage 2 copd (from 2009, based on CT Jan 2009 and PFTs). He returned to see me few months ago for worsening dyspnea on exertion  (class 2-> 2/3 levels) after several years of not following up. He was also finding spiriva expensive. Exam revealed new aortic murmur and I referred him to cards. He saw Dr Daleen Squibb. ECHO 4/9 (results reviewed) shows chronic low systolic heart failure withe ef 45% but new MODERATE-SEVERE AORTIC REGURG. He has been advised followup. Today 10/23/2010, he had PFT - shows unchnaged gold stage 2 copd with fev1 1.77L/59%, ratio 44, DLCO 43%  (March 2009 fev1 1.9L/62%). There are no new associated symptoms or chest pain. He still does not want to go back on spiriva but is open to being on SUMMITT research study (since seeing him research coordinater says he qualifies). Alpha 1 checked today.   Note he is also having mild chronic cough with associated mucus and in context of  ACE inhibitor intake. This is unchanged over time. No clear cut aggravating or relieving factors  Past Medical History  Diagnosis Date  . Kidney tumor (benign)   . Chronic headache   . Hypertension   . Nephrolithiasis   . Renal insufficiency   . Peripheral vascular disease   . BPH (benign prostatic hypertrophy)   . CAD (coronary artery disease)   . Detached retina   . Asthma   . Allergic rhinitis   . Sleep apnea   . Pulmonary fibrosis Jan 2009    very subtle possible fibrosis noted on CT Jan 2009.  Unchanged since Oct 2006. Deemed possibly due to chlorine exposure at gym work swimming pool  . COPD (chronic obstructive pulmonary disease)     2001 PFTs  - FEv1 2.5L/66%, DLCO 57% -> March 2009: Fev1 1.91L/625, Ratio 50, DLCO 50%     Family History  Problem Relation Age of Onset  . Aneurysm Father 50    deceased     History   Social History  . Marital Status: Married   Spouse Name: N/A    Number of Children: N/A  . Years of Education: N/A   Occupational History  . 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 Smoking status: Former Smoker -- 1.0 packs/day for 50 years    Types: Cigarettes    Quit date: 04/23/1997  . Smokeless tobacco: Not on file  . Alcohol Use: No  . Drug Use: No  . Sexually Active: Not on file   Other Topics Concern  . Not on file   Social History Narrative   Pt states had chlorine exposure x 35 yrs.      Allergies  Allergen Reactions  . Beta Adrenergic Blockers      Outpatient Prescriptions Prior to Visit  Medication Sig Dispense Refill  . albuterol (PROVENTIL,VENTOLIN) 90 MCG/ACT inhaler Inhale 2 puffs into the lungs every 6 (six) hours as needed for Wheezing.  17 g  12  . aspirin 81 MG tablet Take 81 mg by mouth daily.        Marland Kitchen doxazosin (CARDURA) 8 MG tablet Take 8 mg by mouth daily.       . finasteride (PROSCAR) 5 MG tablet Take 5 mg by mouth daily.       Marland Kitchen HYDROcodone-acetaminophen (NORCO)  5-325 MG per tablet Take 1 tablet by mouth daily as needed for pain.  20 tablet  0  . lisinopril (PRINIVIL,ZESTRIL) 20 MG tablet Take 20 mg by mouth daily.       Marland Kitchen LORazepam (ATIVAN) 1 MG tablet Take 1 tablet (1 mg total) by mouth daily as needed.  30 tablet  3  . omeprazole (PRILOSEC) 20 MG capsule Take 20 mg by mouth 2 (two) times daily.       . polyethylene glycol (GLYCOLAX) powder MIX 17 GRAMS IN LIQUID ONCE DAILY  255 g  3  . senna (SENOKOT) 8.6 MG tablet Take 1 tablet by mouth 3 (three) times daily. Hold for loose stools       . tiotropium (SPIRIVA) 18 MCG inhalation capsule Place 18 mcg into inhaler and inhale daily.        Marland Kitchen topiramate (TOPAMAX) 100 MG tablet Take 100 mg by mouth daily.        Marland Kitchen zolpidem (AMBIEN) 5 MG tablet Take 5 mg by mouth at bedtime as needed.            Review of Systems  Constitutional: Negative for fever and unexpected weight change.  HENT: Negative for ear pain,  nosebleeds, congestion, sore throat, rhinorrhea, sneezing, trouble swallowing, dental problem, postnasal drip and sinus pressure.   Eyes: Negative for redness and itching.  Respiratory: Positive for shortness of breath. Negative for cough, chest tightness and wheezing.   Cardiovascular: Negative for palpitations and leg swelling.  Gastrointestinal: Negative for nausea and vomiting.  Genitourinary: Negative for dysuria.  Musculoskeletal: Negative for joint swelling.  Skin: Negative for rash.  Neurological: Negative for headaches.  Hematological: Does not bruise/bleed easily.  Psychiatric/Behavioral: Negative for dysphoric mood. The patient is not nervous/anxious.        Objective:   Physical Exam Physical Exam  [nursing notereviewed. Constitutional: He is oriented to person, place, and time. He appears well-developed and well-nourished. No distress.  HENT:  Head: Normocephalic and atraumatic.  Right Ear: External ear normal.  Left Ear: External ear normal.  Mouth/Throat: Oropharynx is clear and moist. No oropharyngeal exudate.  Eyes: Conjunctivae and EOM are normal. Pupils are equal, round, and reactive to light. Right eye exhibits no discharge. Left eye exhibits no discharge. No scleral icterus.  Neck: Normal range of motion. Neck supple. No JVD present. No tracheal deviation present. No thyromegaly present.  Cardiovascular: Normal rate, regular rhythm and intact distal pulses.  Exam reveals no gallop and no friction rub.   Murmur: ejection systolic murmur grade 2 in aortic area -  Pulmonary/Chest: Effort normal and breath sounds normal. No respiratory distress. He has no wheezes. He has no rales. He exhibits no tenderness.  Abdominal: Soft. Bowel sounds are normal. He exhibits no distension and no mass. There is no tenderness. There is no rebound and no guarding.  Musculoskeletal: Normal range of motion. He exhibits no edema and no tenderness.  Lymphadenopathy:    He has no cervical  adenopathy.  Neurological: He is alert and oriented to person, place, and time. He has normal reflexes. No cranial nerve deficit. Coordination normal.  Skin: Skin is warm and dry. No rash noted. He is not diaphoretic. No erythema. No pallor.  Psychiatric: He has a normal mood and affect. His behavior is normal. Judgment and thought content normal.              Assessment & Plan:

## 2010-10-23 NOTE — Patient Instructions (Addendum)
#  Shortness of breath  - your breathing test is not much changed - shows copd (moderate). So, I suspect you are worse due to medication inefficaccy or deconditioning  - we will screen you for summitt research study - if you qualify we will switch spiriva to combivent. If you do not qualify, we will switch spiriva to dulera samples - till then continue spiriva - take sample  - refer to pulmonary rehab  - await alpha 1 genetic test - refer pulmonary rehab - if these do not help, will get CT chest to look for scarring in lungs #cough and mucus  -stop lisinopril  -start bystolic 5mg  per day (take 2-3 months samples) #Followup  - 6 weeks to report progress - will let you know about research study soon   ..................... Update 10/27/2010  - start combivent

## 2010-10-27 ENCOUNTER — Encounter: Payer: Self-pay | Admitting: Cardiology

## 2010-10-27 ENCOUNTER — Telehealth: Payer: Self-pay | Admitting: Internal Medicine

## 2010-10-27 ENCOUNTER — Encounter: Payer: Self-pay | Admitting: Internal Medicine

## 2010-10-27 DIAGNOSIS — R05 Cough: Secondary | ICD-10-CM | POA: Insufficient documentation

## 2010-10-27 DIAGNOSIS — R911 Solitary pulmonary nodule: Secondary | ICD-10-CM | POA: Insufficient documentation

## 2010-10-27 DIAGNOSIS — R059 Cough, unspecified: Secondary | ICD-10-CM | POA: Insufficient documentation

## 2010-10-27 HISTORY — DX: Solitary pulmonary nodule: R91.1

## 2010-10-27 MED ORDER — IPRATROPIUM-ALBUTEROL 18-103 MCG/ACT IN AERO: 2.0000 | INHALATION_SPRAY | Freq: Four times a day (QID) | RESPIRATORY_TRACT | Status: DC | PRN

## 2010-10-27 NOTE — Telephone Encounter (Signed)
Jen, Please call in 1 sample of combivent inhaler for him. If sample or discount card in office, please give him that. HE is going to get screened for SUMMITT research study and will not need spiriva for now

## 2010-10-27 NOTE — Assessment & Plan Note (Signed)
Very subtle possible fibrosis described  on CT Jan 2009.  Unchanged since Oct 2006. Deemed possibly due to chlorine exposure at gym work swimming pool. I am not so sure this is of any significance. I have emailed Dr. Fredirick Lathe thoracic radiologist about her opinion on this

## 2010-10-27 NOTE — Telephone Encounter (Signed)
No samples available. Rx sent. Carron Curie, CMA

## 2010-10-27 NOTE — Assessment & Plan Note (Signed)
DC ace inhibitor STart bisoprolol samples

## 2010-10-27 NOTE — Assessment & Plan Note (Signed)
2001 PFTs  - FEv1 2.5L/66%, DLCO 57% -> March 2009: Fev1 1.91L/625, Ratio 50, DLCO 50% -> 2012: fev1 1.77L/59%, DLCO 9.5/43%. Desaturaed on walking 185 feet x 3 laps - march 2012  Has Gold stage 2 copd. Only age related progression since 2009. Suspect dyspnea worsening is due to age related copd progression, deconditioning, chronic systolic chf and also likely due to new aortic regurgitation  PLAN REfer pulmnary rehab O2 decision based on o2 levels at rehab or followup (currently reluctant) Checked alpha 1 today Refer SUMMITT study (he understands he could be on placebo v LABA v ICS v both) Start combivent baseline Monitor

## 2010-10-27 NOTE — Assessment & Plan Note (Signed)
Will check with Dr Daleen Squibb if aortic regurg contributin and what options exist

## 2010-10-27 NOTE — Assessment & Plan Note (Signed)
LLL pulmpnary nodule in CT Jan 2009 was through to be slightly enlarged to before.  HE has not hasd CT chest since but cxr looks okay.I have emailed Dr. Fredirick Lathe to look at th jan 2009 ct and get back if he needs repeat ct chest

## 2010-10-30 ENCOUNTER — Ambulatory Visit: Payer: Medicare PPO | Admitting: Cardiology

## 2010-10-30 ENCOUNTER — Encounter: Payer: Self-pay | Admitting: Cardiology

## 2010-10-31 ENCOUNTER — Encounter: Payer: Self-pay | Admitting: Cardiology

## 2010-11-07 ENCOUNTER — Ambulatory Visit (INDEPENDENT_AMBULATORY_CARE_PROVIDER_SITE_OTHER): Payer: Medicare PPO | Admitting: Internal Medicine

## 2010-11-07 ENCOUNTER — Encounter: Payer: Self-pay | Admitting: Internal Medicine

## 2010-11-07 DIAGNOSIS — I10 Essential (primary) hypertension: Secondary | ICD-10-CM

## 2010-11-07 DIAGNOSIS — R040 Epistaxis: Secondary | ICD-10-CM

## 2010-11-08 ENCOUNTER — Encounter: Payer: Self-pay | Admitting: Internal Medicine

## 2010-11-08 NOTE — Progress Notes (Signed)
  Subjective:    Patient ID: [name], male    DOB: 1930-11-21, 77 y.o.   MRN: [id number]  HPI  77 year old patient has a history of treated hypertension coronary artery disease and COPD. He presents with a two-week history of episodic epistaxis. He has had perhaps 7 episodes over the past 2 weeks that are fairly mild and self-limiting. He is able to stop the bleeding after several minutes of pressure. He has had a prior episode in the past that has required cautery. He called his ENT physician initially but they no longer take his insurance. Epistaxis is always right sided   Review of Systems  HENT: Positive for nosebleeds.        Objective:   Physical Exam  Constitutional: He appears well-developed and well-nourished. No distress.       Blood pressure 120/70  HENT:       A small crust was noted involving the right outer nares the septum appeared normal no polyps appreciated or other lesions          Assessment & Plan:   Recurrent epistaxis. We'll try frequent application of a light coat  with Vaseline and clinically observed. If epistaxis is recurrent he has been asked to call the office for an ENT referral

## 2010-11-08 NOTE — Patient Instructions (Signed)
Call the  office for an ENT referral if epistaxis recurs

## 2010-11-27 ENCOUNTER — Ambulatory Visit: Payer: Medicare PPO | Admitting: Internal Medicine

## 2010-11-27 ENCOUNTER — Encounter (INDEPENDENT_AMBULATORY_CARE_PROVIDER_SITE_OTHER): Payer: Medicare PPO | Admitting: Ophthalmology

## 2010-11-27 DIAGNOSIS — H43819 Vitreous degeneration, unspecified eye: Secondary | ICD-10-CM

## 2010-11-27 DIAGNOSIS — H353290 Exudative age-related macular degeneration, unspecified eye, stage unspecified: Secondary | ICD-10-CM

## 2010-11-27 DIAGNOSIS — H3530 Unspecified macular degeneration: Secondary | ICD-10-CM

## 2010-11-29 ENCOUNTER — Encounter: Payer: Self-pay | Admitting: Internal Medicine

## 2010-12-06 ENCOUNTER — Encounter: Payer: Self-pay | Admitting: Internal Medicine

## 2010-12-06 ENCOUNTER — Ambulatory Visit (INDEPENDENT_AMBULATORY_CARE_PROVIDER_SITE_OTHER): Payer: Medicare PPO | Admitting: Internal Medicine

## 2010-12-06 VITALS — BP 116/68 | HR 84 | Temp 98.1°F | Ht 75.0 in | Wt 203.0 lb

## 2010-12-06 DIAGNOSIS — J984 Other disorders of lung: Secondary | ICD-10-CM

## 2010-12-06 DIAGNOSIS — R911 Solitary pulmonary nodule: Secondary | ICD-10-CM

## 2010-12-06 DIAGNOSIS — R05 Cough: Secondary | ICD-10-CM

## 2010-12-06 DIAGNOSIS — J8410 Pulmonary fibrosis, unspecified: Secondary | ICD-10-CM

## 2010-12-06 DIAGNOSIS — R0609 Other forms of dyspnea: Secondary | ICD-10-CM

## 2010-12-06 DIAGNOSIS — J449 Chronic obstructive pulmonary disease, unspecified: Secondary | ICD-10-CM

## 2010-12-06 NOTE — Assessment & Plan Note (Signed)
Very subtle possible fibrosis noted on CT Jan 2009.  Unchanged since Oct 2006. Deemed possibly due to chlorine exposure at gym work swimming pool. D/w Dr Fredirick Lathe radiologist on 10/27/10 via email: Pulmonary fibrosis not significan / non-existent and does not warrant followup

## 2010-12-06 NOTE — Progress Notes (Signed)
Subjective:    Patient ID: [name], male    DOB: [birth year], 77 y.o.   MRN: [id number]  HPI  OV 10/23/2010: [name] has gold stage 2 copd (from 2009, based on CT Jan 2009 and PFTs). He returned to see me few months ago for worsening dyspnea on exertion  (class 2-> 2/3 levels) after several years of not following up. He was also finding spiriva expensive. Exam revealed new aortic murmur and I referred him to cards. He saw Dr Daleen Squibb. ECHO 4/9 (results reviewed) shows chronic low systolic heart failure withe ef 45% but new MODERATE-SEVERE AORTIC REGURG. He has been advised followup. Today 10/23/2010, he had PFT - shows unchnaged gold stage 2 copd with fev1 1.77L/59%, ratio 44, DLCO 43%  (March 2009 fev1 1.9L/62%). There are no new associated symptoms or chest pain. He still does not want to go back on spiriva but is open to being on [facility] research study (since seeing him research coordinater says he qualifies). Alpha 1 checked today - Note he is also having mild chronic cough with associated mucus and in context of  ACE inhibitor intake. This is unchanged over time. No clear cut aggravating or relieving factors  OV 12/06/2010. Followup for Gold stage 2  COPD , ACE inhbitor related cough, Dyspnea out of proportion to PFTs, Pulmonary nodule.  He is following today for above. He is yet to enrol in [facility]ITT study. According to coordinator he no showed. According to him, he informe her of death in family and then never got a call back. Stated at first he is on combivent prn and later changed story that he is on spiriva daily (though last time he mentioned that spiriva was not helping him). Still dyspneic. Yet to start rehab - rehab dept has recievied his referral and Jamesetta So emailed me stating she had called him x 2 but he never called back. However, he states he never got called. Still dyspneic and seeking relief. Is again interested in [facility] STUDY   In terms of cough, this not an issue anymore. Tried  bisoprolol and feels it caused epistaxis. Back on lisinopril wihtout cough and wants to continue same  Regarding CT nodule and possible pulmonary fibrosis - d/w Dr Fredirick Lathe radiologist over email 10/27/10. She feels patient to have option of repeat CT (he refused) and does not feel  he has pulmonary fibrosis  Past Medical History  Diagnosis Date  . Kidney tumor (benign)   . Chronic headache   . Hypertension   . Nephrolithiasis   . Renal insufficiency   . Peripheral vascular disease   . BPH (benign prostatic hypertrophy)   . CAD (coronary artery disease)   . Detached retina   . Asthma   . Allergic rhinitis   . Sleep apnea   . Pulmonary fibrosis Jan 2009    very subtle possible fibrosis noted on CT Jan 2009.  Unchanged since Oct 2006. Deemed possibly due to chlorine exposure at gym work swimming pool  . COPD (chronic obstructive pulmonary disease)     2001 PFTs  - FEv1 2.5L/66%, DLCO 57% -> March 2009: Fev1 1.91L/625, Ratio 50, DLCO 50%     Family History  Problem Relation Age of Onset  . Aneurysm Father 67    deceased     History   Social History  . Marital Status: Married    Spouse Name: N/A    Number of Children: N/A  . Years of Education: N/A   Occupational History  .  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 Smoking status: Former Smoker -- 1.0 packs/day for 50 years    Types: Cigarettes    Quit date: 04/23/1997  . Smokeless tobacco: Not on file  . Alcohol Use: No  . Drug Use: No  . Sexually Active: Not on file   Other Topics Concern  . Not on file   Social History Narrative   Pt states had chlorine exposure x 35 yrs.      Allergies  Allergen Reactions  . Beta Adrenergic Blockers      Outpatient Prescriptions Prior to Visit  Medication Sig Dispense Refill  . albuterol (PROVENTIL,VENTOLIN) 90 MCG/ACT inhaler Inhale 2 puffs into the lungs every 6 (six) hours as needed for Wheezing.  17 g  12  . albuterol-ipratropium (COMBIVENT)  18-103 MCG/ACT inhaler Inhale 2 puffs into the lungs every 6 (six) hours as needed for wheezing.  1 Inhaler  2  . aspirin 81 MG tablet Take 81 mg by mouth daily.        Marland Kitchen doxazosin (CARDURA) 8 MG tablet Take 8 mg by mouth daily.       . finasteride (PROSCAR) 5 MG tablet Take 5 mg by mouth daily.       Marland Kitchen HYDROcodone-acetaminophen (NORCO) 5-325 MG per tablet Take 1 tablet by mouth daily as needed for pain.  20 tablet  0  . lisinopril (PRINIVIL,ZESTRIL) 20 MG tablet Take 20 mg by mouth daily.       Marland Kitchen LORazepam (ATIVAN) 1 MG tablet Take 1 tablet (1 mg total) by mouth daily as needed.  30 tablet  3  . omeprazole (PRILOSEC) 20 MG capsule Take 20 mg by mouth 2 (two) times daily.       . polyethylene glycol (GLYCOLAX) powder MIX 17 GRAMS IN LIQUID ONCE DAILY  255 g  3  . senna (SENOKOT) 8.6 MG tablet Take 1 tablet by mouth 3 (three) times daily. Hold for loose stools       . tiotropium (SPIRIVA) 18 MCG inhalation capsule Place 18 mcg into inhaler and inhale daily.        Marland Kitchen topiramate (TOPAMAX) 100 MG tablet Take 100 mg by mouth daily.        Marland Kitchen zolpidem (AMBIEN) 5 MG tablet Take 5 mg by mouth at bedtime as needed.           Review of Systems  Constitutional: Negative for fever and unexpected weight change.  HENT: Negative for ear pain, nosebleeds, congestion, sore throat, rhinorrhea, sneezing, trouble swallowing, dental problem, postnasal drip and sinus pressure.   Eyes: Negative for redness and itching.  Respiratory: Negative for cough, chest tightness, shortness of breath and wheezing.   Cardiovascular: Negative for palpitations and leg swelling.  Gastrointestinal: Negative for nausea and vomiting.  Genitourinary: Negative for dysuria.  Musculoskeletal: Negative for joint swelling.  Skin: Negative for rash.  Neurological: Negative for headaches.  Hematological: Does not bruise/bleed easily.  Psychiatric/Behavioral: Negative for dysphoric mood. The patient is not nervous/anxious.          Objective:   Physical Exam  Nursing note and vitals reviewed. Constitutional: He is oriented to person, place, and time. He appears well-developed and well-nourished. No distress.  HENT:  Head: Normocephalic and atraumatic.  Right Ear: External ear normal.  Left Ear: External ear normal.  Mouth/Throat: Oropharynx is clear and moist. No oropharyngeal exudate.  Eyes: Conjunctivae and EOM are normal. Pupils  are equal, round, and reactive to light. Right eye exhibits no discharge. Left eye exhibits no discharge. No scleral icterus.  Neck: Normal range of motion. Neck supple. No JVD present. No tracheal deviation present. No thyromegaly present.  Cardiovascular: Normal rate, regular rhythm and intact distal pulses.  Exam reveals no gallop and no friction rub.   No murmur heard. Pulmonary/Chest: Effort normal. No respiratory distress. He has no wheezes. He has no rales. He exhibits no tenderness.  Abdominal: Soft. Bowel sounds are normal. He exhibits no distension and no mass. There is no tenderness. There is no rebound and no guarding.  Musculoskeletal: Normal range of motion. He exhibits no edema and no tenderness.  Lymphadenopathy:    He has no cervical adenopathy.  Neurological: He is alert and oriented to person, place, and time. He has normal reflexes. No cranial nerve deficit. Coordination normal.  Skin: Skin is warm and dry. No rash noted. He is not diaphoretic. No erythema. No pallor.  Psychiatric: He has a normal mood and affect. His behavior is normal. Judgment and thought content normal.          Assessment & Plan:

## 2010-12-06 NOTE — Patient Instructions (Addendum)
#  Shortness of breath/Moderate COPD  -we will re-refer you to rehab - I have written to them, nurse will do another order  y  - you qualify for [facility] research study - the coordinator Ms Dreama Saa will call you later today  - continue spiriva - take sample - for now  - once you enrol in study you will be changed from spiriva to combivent but for now continue spiriva 1 puff daily - if these do not help, will get CT chest to look for scarring in lungs  - have flu shot in fall #cough and mucus  -ssince bisoprolol caused nose bleed we will add it to your allergy list  - because lisinopril is not causing cough currently, continue on it for bp control #Pulmonary nodule Left Lower Lobe  - CT 2009 present  - CXR 2012 - not present  - Patient prefers clinical followup after weighing options #Pulmonary fibrosis  - d/w Dr Fredirick Lathe 10/27/2010 via email and feel there is no pulmonary fiboris  - clinical followup only #Followup  - 5 months  - flu shot in fall - return sooner if there are problems

## 2010-12-06 NOTE — Assessment & Plan Note (Signed)
Yet to start [facility] Study and REHAB due to lapse in communication on his part. He did not return calls from rehab and missed appt with research coordinator though he states otherwise. He is continuing spiriva most likely though last visit he preferred not to. Will try again and coordinate his care. He is still interested in [facility]MITT study - coordinator will call him

## 2010-12-06 NOTE — Assessment & Plan Note (Signed)
If this does not imprve with rehab. His heart might need to be looked at again

## 2010-12-06 NOTE — Assessment & Plan Note (Signed)
#  Pulmonary nodule Left Lower Lobe  - CT 2009 present  - CXR 2012 - not present  - Patient prefers clinical followup after weighing options; wants to tackle dyspnea for now

## 2010-12-06 NOTE — Assessment & Plan Note (Signed)
Tried to make him stop ACe inhibitor in July 2012 and tried bisoprolol but he got epistaxis and went back on ace inibitor. This aguust 2012 visit states he has no cough and prefers continuing on ace inhibitor. So, will let him do so

## 2010-12-14 ENCOUNTER — Encounter: Payer: Self-pay | Admitting: Cardiology

## 2010-12-14 ENCOUNTER — Ambulatory Visit (INDEPENDENT_AMBULATORY_CARE_PROVIDER_SITE_OTHER): Payer: Medicare PPO | Admitting: Cardiology

## 2010-12-14 ENCOUNTER — Encounter (HOSPITAL_COMMUNITY): Payer: Medicare PPO | Attending: Internal Medicine

## 2010-12-14 DIAGNOSIS — R0989 Other specified symptoms and signs involving the circulatory and respiratory systems: Secondary | ICD-10-CM

## 2010-12-14 DIAGNOSIS — Z5189 Encounter for other specified aftercare: Secondary | ICD-10-CM | POA: Insufficient documentation

## 2010-12-14 DIAGNOSIS — I2510 Atherosclerotic heart disease of native coronary artery without angina pectoris: Secondary | ICD-10-CM

## 2010-12-14 DIAGNOSIS — I447 Left bundle-branch block, unspecified: Secondary | ICD-10-CM | POA: Insufficient documentation

## 2010-12-14 DIAGNOSIS — E109 Type 1 diabetes mellitus without complications: Secondary | ICD-10-CM | POA: Insufficient documentation

## 2010-12-14 DIAGNOSIS — J449 Chronic obstructive pulmonary disease, unspecified: Secondary | ICD-10-CM | POA: Insufficient documentation

## 2010-12-14 DIAGNOSIS — J8410 Pulmonary fibrosis, unspecified: Secondary | ICD-10-CM | POA: Insufficient documentation

## 2010-12-14 DIAGNOSIS — J4489 Other specified chronic obstructive pulmonary disease: Secondary | ICD-10-CM | POA: Insufficient documentation

## 2010-12-14 DIAGNOSIS — I270 Primary pulmonary hypertension: Secondary | ICD-10-CM | POA: Insufficient documentation

## 2010-12-14 DIAGNOSIS — R011 Cardiac murmur, unspecified: Secondary | ICD-10-CM

## 2010-12-14 DIAGNOSIS — I10 Essential (primary) hypertension: Secondary | ICD-10-CM | POA: Insufficient documentation

## 2010-12-14 DIAGNOSIS — I359 Nonrheumatic aortic valve disorder, unspecified: Secondary | ICD-10-CM

## 2010-12-14 DIAGNOSIS — I739 Peripheral vascular disease, unspecified: Secondary | ICD-10-CM | POA: Insufficient documentation

## 2010-12-14 DIAGNOSIS — R0609 Other forms of dyspnea: Secondary | ICD-10-CM

## 2010-12-14 NOTE — Patient Instructions (Signed)
Your physician recommends that you schedule a follow-up appointment in: 4 weeks with Dr. Daleen Squibb Your physician has recommended that you have a cardiopulmonary stress test (CPX). CPX testing is a non-invasive measurement of heart and lung function. It replaces a traditional treadmill stress test. This type of test provides a tremendous amount of information that relates not only to your present condition but also for future outcomes. This test combines measurements of you ventilation, respiratory gas exchange in the lungs, electrocardiogram (EKG), blood pressure and physical response before, during, and following an exercise protocol.

## 2010-12-14 NOTE — Assessment & Plan Note (Signed)
Echocardiogram shows moderate to severe aortic insufficiency. Left ventricular chamber size was said to be normal by the narrative but it is of mildly dilated by  the measurements. His dyspnea on exertion has not changed that much over the years, however, we need to determine if any of his exercise limitation is due to his valvular heart disease. We'll obtain a cardiopulmonary stress test. This could also show if this is pulmonary and/or deconditioning. I discussed this with Dr. Marchelle Gearing who agrees with the plan. I'll have the patient return after this study to discuss findings and recommendations. I've made no changes in his medical therapy today. Please note that he is intolerant of beta blockers but can take the ACE inhibitor. He is advised to continue pulmonary rehabilitation.

## 2010-12-14 NOTE — Progress Notes (Signed)
HPI Mr. [name] comes in today to discuss the findings of his echocardiogram report. It showed moderate to severe aortic insufficiency with an ejection fraction of around 40%. Left ventricular chamber size was said to be normal but the measurements were 56.9 in end diastole and 47.2 in systole. The aortic root was also mildly dilated at 44 mm. He had mild mitral regurgitation.  He's had dyspnea since 1999. At that time he quit smoking but can only get up a flight of steps halfway before stopping. This is not changed significantly. His FEV1 per pulmonary has diminished some consistent with age but is not changed dramatically. Please see Dr. Jane Canary note.  He also is a history of coronary artery disease and had previous remote PTCA. He denies any angina.  He could not tolerate the bisoprolol because of nosebleeds. He's had problems with asthma and bronchospasm in the past with beta blockers. He started back on his lisinopril.  He's also significantly deconditioned.  He just began pulmonary rehabilitation. Past Medical History  Diagnosis Date  . Kidney tumor (benign)   . Chronic headache   . Hypertension   . Nephrolithiasis   . Renal insufficiency   . Peripheral vascular disease   . BPH (benign prostatic hypertrophy)   . CAD (coronary artery disease)   . Detached retina   . Asthma   . Allergic rhinitis   . Sleep apnea   . Pulmonary fibrosis Jan 2009    very subtle possible fibrosis noted on CT Jan 2009.  Unchanged since Oct 2006. Deemed possibly due to chlorine exposure at gym work swimming pool  . COPD (chronic obstructive pulmonary disease)     2001 PFTs  - FEv1 2.5L/66%, DLCO 57% -> March 2009: Fev1 1.91L/625, Ratio 50, DLCO 50%    Past Surgical History  Procedure Date  . Nephrectomy 2003     left partial, benign tumor  . Vasectomy   . Ptca 2001 and 2002  . Abdominal aortic aneurysm repair 1997  . Cataract extraction     bilateral  . Cardiac catheterization 07/22/02    EF 55%      Family History  Problem Relation Age of Onset  . Aneurysm Father 26    deceased    History   Social History  . Marital Status: Married    Spouse Name: N/A    Number of Children: N/A  . Years of Education: N/A   Occupational History  . 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 Smoking status: Former Smoker -- 1.0 packs/day for 50 years    Types: Cigarettes    Quit date: 04/23/1997  . Smokeless tobacco: Not on file  . Alcohol Use: No  . Drug Use: No  . Sexually Active: Not on file   Other Topics Concern  . Not on file   Social History Narrative   Pt states had chlorine exposure x 35 yrs.     Allergies  Allergen Reactions  . Beta Adrenergic Blockers     Current Outpatient Prescriptions  Medication Sig Dispense Refill  . albuterol (PROVENTIL,VENTOLIN) 90 MCG/ACT inhaler Inhale 2 puffs into the lungs every 6 (six) hours as needed for Wheezing.  17 g  12  . aspirin 81 MG tablet Take 81 mg by mouth daily.        Marland Kitchen doxazosin (CARDURA) 8 MG tablet Take 8 mg by mouth daily.       . finasteride (PROSCAR)  5 MG tablet Take 5 mg by mouth daily.       Marland Kitchen HYDROcodone-acetaminophen (NORCO) 5-325 MG per tablet Take 1 tablet by mouth daily as needed for pain.  20 tablet  0  . lisinopril (PRINIVIL,ZESTRIL) 20 MG tablet Take 20 mg by mouth daily.       Marland Kitchen LORazepam (ATIVAN) 1 MG tablet Take 1 tablet (1 mg total) by mouth daily as needed.  30 tablet  3  . omeprazole (PRILOSEC) 20 MG capsule Take 20 mg by mouth 2 (two) times daily.       . polyethylene glycol (GLYCOLAX) powder MIX 17 GRAMS IN LIQUID ONCE DAILY  255 g  3  . senna (SENOKOT) 8.6 MG tablet Take 1 tablet by mouth 3 (three) times daily. Hold for loose stools       . tiotropium (SPIRIVA) 18 MCG inhalation capsule Place 18 mcg into inhaler and inhale daily.        Marland Kitchen topiramate (TOPAMAX) 100 MG tablet Take 100 mg by mouth daily.        Marland Kitchen zolpidem (AMBIEN) 5 MG tablet Take 5 mg by mouth at bedtime  as needed.         ROS Negative other than HPI.   PE General Appearance: well developed, well nourished in no acute distress HEENT: symmetrical face, PERRLA, good dentition  Neck: no JVD, thyromegaly, or adenopathy, trachea midline Chest: symmetric without deformity Cardiac: PMI non-displaced, RRR, soft S1-S2 and S2 splits. There is a 2/6 diastolic murmur along the right upper sternal border. No gallop. Lung: decreased breath sounds throughout Vascular: all pulses full without bruits  Abdominal: nondistended, nontender, good bowel sounds, no HSM, no bruits Extremities: no cyanosis, clubbing or edema, no sign of DVT, no varicosities  Skin: normal color, no rashes Neuro: alert and oriented x 3, non-focal Pysch: normal affect Filed Vitals:   12/14/10 1222  Height: 6\' 1"  (1.854 m)  Weight: 200 lb 12.8 oz (91.082 kg)    EKG  Labs and Studies Reviewed.   Lab Results  Component Value Date   WBC 6.4 03/21/2009   HGB 13.6 05/26/2009   HCT 40.0 05/26/2009   MCV 92.5 03/21/2009   PLT 162.0 03/21/2009      Chemistry      Component Value Date/Time   NA 142 02/06/2010 1130   K 3.8 02/06/2010 1130   CL 112 02/06/2010 1130   CO2 22 02/06/2010 1130   BUN 21 02/06/2010 1130   CREATININE 1.5 02/06/2010 1130      Component Value Date/Time   CALCIUM 9.0 02/06/2010 1130   ALKPHOS 61 02/06/2010 1130   AST 17 02/06/2010 1130   ALT 13 02/06/2010 1130   BILITOT 0.8 02/06/2010 1130       Lab Results  Component Value Date   CHOL 164 02/06/2010   CHOL 94 05/23/2009   CHOL 177 03/21/2009   Lab Results  Component Value Date   HDL 26.40* 02/06/2010   HDL 30.80* 05/23/2009   HDL 27.70* 03/21/2009   Lab Results  Component Value Date   LDLCALC 107* 02/06/2010   LDLCALC 53 05/23/2009   LDLCALC 127* 03/21/2009   Lab Results  Component Value Date   TRIG 154.0* 02/06/2010   TRIG 53.0 05/23/2009   TRIG 113.0 03/21/2009   Lab Results  Component Value Date   CHOLHDL 6 02/06/2010    CHOLHDL 3 05/23/2009   CHOLHDL 6 03/21/2009   No results found for this basename: HGBA1C   Lab  Results  Component Value Date   ALT 13 02/06/2010   AST 17 02/06/2010   ALKPHOS 61 02/06/2010   BILITOT 0.8 02/06/2010   Lab Results  Component Value Date   TSH 1.02 03/21/2009

## 2010-12-19 ENCOUNTER — Encounter (HOSPITAL_COMMUNITY): Payer: Medicare PPO

## 2010-12-21 ENCOUNTER — Encounter (HOSPITAL_COMMUNITY): Payer: Medicare PPO

## 2010-12-22 ENCOUNTER — Encounter: Payer: Self-pay | Admitting: Internal Medicine

## 2010-12-26 ENCOUNTER — Encounter (HOSPITAL_COMMUNITY): Payer: Medicare PPO

## 2010-12-28 ENCOUNTER — Encounter (HOSPITAL_COMMUNITY): Payer: Medicare PPO

## 2011-01-02 ENCOUNTER — Ambulatory Visit (HOSPITAL_COMMUNITY): Payer: Medicare PPO

## 2011-01-02 ENCOUNTER — Encounter (HOSPITAL_COMMUNITY): Payer: Medicare PPO

## 2011-01-04 ENCOUNTER — Encounter (HOSPITAL_COMMUNITY): Payer: Medicare PPO

## 2011-01-08 ENCOUNTER — Encounter (INDEPENDENT_AMBULATORY_CARE_PROVIDER_SITE_OTHER): Payer: Medicare PPO | Admitting: Ophthalmology

## 2011-01-08 DIAGNOSIS — H3530 Unspecified macular degeneration: Secondary | ICD-10-CM

## 2011-01-08 DIAGNOSIS — H353290 Exudative age-related macular degeneration, unspecified eye, stage unspecified: Secondary | ICD-10-CM

## 2011-01-08 DIAGNOSIS — H43819 Vitreous degeneration, unspecified eye: Secondary | ICD-10-CM

## 2011-01-09 ENCOUNTER — Encounter (HOSPITAL_COMMUNITY): Payer: Medicare PPO

## 2011-01-11 ENCOUNTER — Encounter: Payer: Self-pay | Admitting: Cardiology

## 2011-01-11 ENCOUNTER — Encounter (INDEPENDENT_AMBULATORY_CARE_PROVIDER_SITE_OTHER): Payer: Medicare PPO | Admitting: Cardiology

## 2011-01-11 ENCOUNTER — Encounter (HOSPITAL_COMMUNITY): Payer: Medicare PPO

## 2011-01-11 VITALS — BP 122/72 | HR 64 | Ht 73.0 in | Wt 202.1 lb

## 2011-01-11 DIAGNOSIS — R0989 Other specified symptoms and signs involving the circulatory and respiratory systems: Secondary | ICD-10-CM

## 2011-01-11 NOTE — Assessment & Plan Note (Signed)
The patient came today to discuss his CPX was never scheduled. We have given him back his co-pay and schedule the study. Apology was made. He accepts.

## 2011-01-16 ENCOUNTER — Encounter (HOSPITAL_COMMUNITY): Payer: Medicare PPO

## 2011-01-18 ENCOUNTER — Encounter (HOSPITAL_COMMUNITY): Payer: Medicare PPO

## 2011-01-23 ENCOUNTER — Encounter (HOSPITAL_COMMUNITY): Payer: Medicare PPO

## 2011-01-25 ENCOUNTER — Encounter (HOSPITAL_COMMUNITY): Payer: Medicare PPO

## 2011-01-30 ENCOUNTER — Encounter (HOSPITAL_COMMUNITY): Payer: Medicare PPO

## 2011-01-30 ENCOUNTER — Ambulatory Visit (HOSPITAL_COMMUNITY): Payer: Medicare PPO | Attending: Cardiology

## 2011-01-30 DIAGNOSIS — I359 Nonrheumatic aortic valve disorder, unspecified: Secondary | ICD-10-CM | POA: Insufficient documentation

## 2011-01-30 DIAGNOSIS — R0609 Other forms of dyspnea: Secondary | ICD-10-CM

## 2011-01-30 DIAGNOSIS — R0989 Other specified symptoms and signs involving the circulatory and respiratory systems: Secondary | ICD-10-CM | POA: Insufficient documentation

## 2011-02-01 ENCOUNTER — Encounter (HOSPITAL_COMMUNITY): Payer: Medicare PPO

## 2011-02-06 ENCOUNTER — Encounter (HOSPITAL_COMMUNITY): Payer: Medicare PPO

## 2011-02-07 ENCOUNTER — Telehealth: Payer: Self-pay | Admitting: *Deleted

## 2011-02-07 NOTE — Telephone Encounter (Signed)
LMTCB CPX results.  Mylo Red RN

## 2011-02-08 ENCOUNTER — Encounter (HOSPITAL_COMMUNITY): Payer: Medicare PPO

## 2011-02-08 NOTE — Telephone Encounter (Signed)
Pt is aware of cpx results.   Mylo Red RN

## 2011-02-13 ENCOUNTER — Encounter (HOSPITAL_COMMUNITY): Payer: Medicare PPO

## 2011-02-15 ENCOUNTER — Encounter (HOSPITAL_COMMUNITY): Payer: Medicare PPO

## 2011-02-19 ENCOUNTER — Encounter (INDEPENDENT_AMBULATORY_CARE_PROVIDER_SITE_OTHER): Payer: Medicare PPO | Admitting: Ophthalmology

## 2011-02-19 DIAGNOSIS — H3530 Unspecified macular degeneration: Secondary | ICD-10-CM

## 2011-02-19 DIAGNOSIS — H353290 Exudative age-related macular degeneration, unspecified eye, stage unspecified: Secondary | ICD-10-CM

## 2011-02-19 DIAGNOSIS — H43819 Vitreous degeneration, unspecified eye: Secondary | ICD-10-CM

## 2011-02-20 ENCOUNTER — Encounter (HOSPITAL_COMMUNITY): Payer: Medicare PPO

## 2011-02-21 ENCOUNTER — Telehealth: Payer: Self-pay | Admitting: Internal Medicine

## 2011-02-21 NOTE — Telephone Encounter (Signed)
Pt requesting refill on LORazepam (ATIVAN) 1 MG tablet   Right Source

## 2011-02-22 ENCOUNTER — Encounter (HOSPITAL_COMMUNITY): Payer: Medicare PPO

## 2011-02-22 NOTE — Telephone Encounter (Signed)
Left message to call back and schedule ov

## 2011-02-22 NOTE — Telephone Encounter (Signed)
Pt needs ov. 

## 2011-02-23 ENCOUNTER — Ambulatory Visit (INDEPENDENT_AMBULATORY_CARE_PROVIDER_SITE_OTHER): Payer: Medicare PPO | Admitting: Family Medicine

## 2011-02-23 ENCOUNTER — Encounter: Payer: Self-pay | Admitting: Family Medicine

## 2011-02-23 VITALS — BP 116/70 | Temp 97.7°F | Wt 203.0 lb

## 2011-02-23 DIAGNOSIS — Z23 Encounter for immunization: Secondary | ICD-10-CM

## 2011-02-23 DIAGNOSIS — G4700 Insomnia, unspecified: Secondary | ICD-10-CM

## 2011-02-23 DIAGNOSIS — G8929 Other chronic pain: Secondary | ICD-10-CM

## 2011-02-23 DIAGNOSIS — M549 Dorsalgia, unspecified: Secondary | ICD-10-CM

## 2011-02-23 DIAGNOSIS — Z0000 Encounter for general adult medical examination without abnormal findings: Secondary | ICD-10-CM

## 2011-02-23 MED ORDER — LORAZEPAM 1 MG PO TABS: 1.0000 mg | ORAL_TABLET | Freq: Every day | ORAL | Status: DC | PRN

## 2011-02-23 NOTE — Progress Notes (Signed)
  Subjective:    Patient ID: [name], male    DOB: [birth year], 77 y.o.   MRN: [id number]  HPI  Patient here requesting flu shot and also refills lorazepam. He has taken this for several years both for occasional insomnia and also for frequent left upper back and periscapular muscle stiffness. He relates rotator cuff injury several years ago and takes lorazepam as needed along with ibuprofen and this seemed to help. No adverse side effects. No gait problems. No risk of falls.   Review of Systems  Respiratory: Negative for cough and shortness of breath.   Cardiovascular: Negative for chest pain.  Neurological: Negative for dizziness and headaches.       Objective:   Physical Exam  Constitutional: He appears well-developed and well-nourished.  Neck: Neck supple.  Cardiovascular: Normal rate and regular rhythm.   Pulmonary/Chest: Effort normal and breath sounds normal. No respiratory distress. He has no wheezes. He has no rales.  Musculoskeletal: He exhibits no edema.  Lymphadenopathy:    He has no cervical adenopathy.          Assessment & Plan:  #1 intermittent insomnia. Sleep hygiene discussed. #2 upper back pain. Refill lorazepam for as needed use. This apparently had some muscle relaxant qualities for him which has helped. Cautioned against escalation in use

## 2011-02-26 ENCOUNTER — Ambulatory Visit: Payer: Medicare PPO | Admitting: *Deleted

## 2011-02-27 ENCOUNTER — Encounter (HOSPITAL_COMMUNITY): Payer: Medicare PPO

## 2011-03-01 ENCOUNTER — Encounter (HOSPITAL_COMMUNITY): Payer: Medicare PPO

## 2011-03-06 ENCOUNTER — Encounter (HOSPITAL_COMMUNITY): Payer: Medicare PPO

## 2011-03-08 ENCOUNTER — Encounter (HOSPITAL_COMMUNITY): Payer: Medicare PPO

## 2011-03-13 ENCOUNTER — Encounter (HOSPITAL_COMMUNITY): Payer: Medicare PPO

## 2011-03-15 ENCOUNTER — Encounter (HOSPITAL_COMMUNITY): Payer: Medicare PPO

## 2011-03-20 ENCOUNTER — Encounter (HOSPITAL_COMMUNITY): Payer: Medicare PPO

## 2011-03-22 ENCOUNTER — Encounter (HOSPITAL_COMMUNITY): Admission: RE | Admit: 2011-03-22 | Payer: Medicare PPO | Source: Ambulatory Visit

## 2011-03-22 DIAGNOSIS — J449 Chronic obstructive pulmonary disease, unspecified: Secondary | ICD-10-CM | POA: Insufficient documentation

## 2011-03-22 DIAGNOSIS — J4489 Other specified chronic obstructive pulmonary disease: Secondary | ICD-10-CM | POA: Insufficient documentation

## 2011-03-22 DIAGNOSIS — I10 Essential (primary) hypertension: Secondary | ICD-10-CM | POA: Insufficient documentation

## 2011-03-22 DIAGNOSIS — I270 Primary pulmonary hypertension: Secondary | ICD-10-CM | POA: Insufficient documentation

## 2011-03-22 DIAGNOSIS — I2510 Atherosclerotic heart disease of native coronary artery without angina pectoris: Secondary | ICD-10-CM | POA: Insufficient documentation

## 2011-03-22 DIAGNOSIS — I447 Left bundle-branch block, unspecified: Secondary | ICD-10-CM | POA: Insufficient documentation

## 2011-03-22 DIAGNOSIS — E109 Type 1 diabetes mellitus without complications: Secondary | ICD-10-CM | POA: Insufficient documentation

## 2011-03-22 DIAGNOSIS — I739 Peripheral vascular disease, unspecified: Secondary | ICD-10-CM | POA: Insufficient documentation

## 2011-03-22 DIAGNOSIS — J8410 Pulmonary fibrosis, unspecified: Secondary | ICD-10-CM | POA: Insufficient documentation

## 2011-03-22 DIAGNOSIS — Z5189 Encounter for other specified aftercare: Secondary | ICD-10-CM | POA: Insufficient documentation

## 2011-03-27 ENCOUNTER — Encounter (HOSPITAL_COMMUNITY): Payer: Medicare PPO

## 2011-03-29 ENCOUNTER — Encounter (HOSPITAL_COMMUNITY): Payer: Medicare PPO

## 2011-04-02 ENCOUNTER — Encounter (INDEPENDENT_AMBULATORY_CARE_PROVIDER_SITE_OTHER): Payer: Medicare PPO | Admitting: Ophthalmology

## 2011-04-02 DIAGNOSIS — H43819 Vitreous degeneration, unspecified eye: Secondary | ICD-10-CM

## 2011-04-02 DIAGNOSIS — H353290 Exudative age-related macular degeneration, unspecified eye, stage unspecified: Secondary | ICD-10-CM

## 2011-04-02 DIAGNOSIS — H3530 Unspecified macular degeneration: Secondary | ICD-10-CM

## 2011-04-03 ENCOUNTER — Encounter (HOSPITAL_COMMUNITY): Payer: Medicare PPO

## 2011-04-05 ENCOUNTER — Encounter (HOSPITAL_COMMUNITY): Payer: Medicare PPO

## 2011-04-09 ENCOUNTER — Telehealth: Payer: Self-pay | Admitting: Internal Medicine

## 2011-04-09 MED ORDER — TIOTROPIUM BROMIDE MONOHYDRATE 18 MCG IN CAPS: 18.0000 ug | ORAL_CAPSULE | Freq: Every day | RESPIRATORY_TRACT | Status: DC

## 2011-04-09 NOTE — Telephone Encounter (Signed)
Pt aware refill sent. Jennifer Castillo, CMA  

## 2011-04-10 ENCOUNTER — Encounter: Payer: Self-pay | Admitting: Internal Medicine

## 2011-04-10 ENCOUNTER — Encounter (HOSPITAL_COMMUNITY): Payer: Medicare PPO

## 2011-04-10 ENCOUNTER — Ambulatory Visit (INDEPENDENT_AMBULATORY_CARE_PROVIDER_SITE_OTHER): Payer: Medicare PPO | Admitting: Internal Medicine

## 2011-04-10 VITALS — BP 144/80 | HR 76 | Temp 98.0°F | Ht 73.0 in | Wt 202.0 lb

## 2011-04-10 DIAGNOSIS — I2510 Atherosclerotic heart disease of native coronary artery without angina pectoris: Secondary | ICD-10-CM

## 2011-04-10 DIAGNOSIS — I10 Essential (primary) hypertension: Secondary | ICD-10-CM

## 2011-04-10 LAB — BASIC METABOLIC PANEL
Chloride: 114 mEq/L — ABNORMAL HIGH (ref 96–112)
Creatinine, Ser: 1.4 mg/dL (ref 0.4–1.5)
GFR: 50.78 mL/min — ABNORMAL LOW (ref >60.00)

## 2011-04-10 LAB — LIPID PANEL
Cholesterol: 154 mg/dL (ref 0–200)
LDL Cholesterol: 88 mg/dL (ref 0–99)
Triglycerides: 182 mg/dL — ABNORMAL HIGH (ref 0.0–149.0)
VLDL: 36.4 mg/dL (ref 0.0–40.0)

## 2011-04-10 LAB — CBC WITH DIFFERENTIAL/PLATELET
Basophils Absolute: 0 10*3/uL (ref 0.0–0.1)
Eosinophils Absolute: 0.2 10*3/uL (ref 0.0–0.7)
Hemoglobin: 14.8 g/dL (ref 13.0–17.0)
Lymphocytes Relative: 21.9 % (ref 12.0–46.0)
MCHC: 34.2 g/dL (ref 30.0–36.0)
Monocytes Absolute: 0.7 10*3/uL (ref 0.1–1.0)
Neutro Abs: 4.5 10*3/uL (ref 1.4–7.7)
Neutrophils Relative %: 65.7 % (ref 43.0–77.0)
RDW: 14.9 % — ABNORMAL HIGH (ref 11.5–14.6)

## 2011-04-10 MED ORDER — DOXAZOSIN MESYLATE 8 MG PO TABS: 8.0000 mg | ORAL_TABLET | Freq: Every day | ORAL | Status: DC

## 2011-04-10 MED ORDER — ZOLPIDEM TARTRATE 5 MG PO TABS: 5.0000 mg | ORAL_TABLET | Freq: Every evening | ORAL | Status: DC | PRN

## 2011-04-10 MED ORDER — FINASTERIDE 5 MG PO TABS: 5.0000 mg | ORAL_TABLET | Freq: Every day | ORAL | Status: DC

## 2011-04-10 MED ORDER — OMEPRAZOLE 20 MG PO CPDR: 20.0000 mg | DELAYED_RELEASE_CAPSULE | Freq: Two times a day (BID) | ORAL | Status: DC

## 2011-04-10 MED ORDER — LISINOPRIL 20 MG PO TABS: 20.0000 mg | ORAL_TABLET | Freq: Every day | ORAL | Status: DC

## 2011-04-10 NOTE — Progress Notes (Signed)
Patient ID: [name], male   DOB: 1931-03-31, 77 y.o.   MRN: [id number] Patient Active Problem List  Diagnoses  .   .   . HYPERTENSION  . CORONARY ARTERY DISEASE  . PULMONARY HYPERTENSION  . LBBB  . PERIPHERAL VASCULAR DISEASE  . RENAL INSUFFICIENCY  .   Marland Kitchen   . Pulmonary fibrosis---reviewed overview---no need for evaluation- reviewed CT scan  . COPD (chronic obstructive pulmonary disease)  . Cardiac murmur, previously undiagnosed  . Pulmonary nodule, left   Past Medical History  Diagnosis Date  . Kidney tumor (benign)   . Chronic headache   . Hypertension   . Nephrolithiasis   . Renal insufficiency   . Peripheral vascular disease   . BPH (benign prostatic hypertrophy)   . CAD (coronary artery disease)   . Detached retina   . Asthma   . Allergic rhinitis   . Sleep apnea   . Pulmonary fibrosis Jan 2009    very subtle possible fibrosis noted on CT Jan 2009.  Unchanged since Oct 2006. Deemed possibly due to chlorine exposure at gym work swimming pool  . COPD (chronic obstructive pulmonary disease)     2001 PFTs  - FEv1 2.5L/66%, DLCO 57% -> March 2009: Fev1 1.91L/625, Ratio 50, DLCO 50%    History   Social History  . Marital Status: Married    Spouse Name: N/A    Number of Children: N/A  . Years of Education: N/A   Occupational History  . 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 Smoking status: Former Smoker -- 1.0 packs/day for 50 years    Types: Cigarettes    Quit date: 04/23/1997  . Smokeless tobacco: Not on file  . Alcohol Use: No  . Drug Use: No  . Sexually Active: Not on file   Other Topics Concern  . Not on file   Social History Narrative   Pt states had chlorine exposure x 35 yrs.     Past Surgical History  Procedure Date  . Nephrectomy 2003     left partial, benign tumor  . Vasectomy   . Ptca 2001 and 2002  . Abdominal aortic aneurysm repair 1997  . Cataract extraction     bilateral  . Cardiac catheterization  07/22/02    EF 55%    Family History  Problem Relation Age of Onset  . Aneurysm Father 67    deceased    Allergies  Allergen Reactions  . Beta Adrenergic Blockers     Current Outpatient Prescriptions on File Prior to Visit  Medication Sig Dispense Refill  . albuterol (PROVENTIL,VENTOLIN) 90 MCG/ACT inhaler Inhale 2 puffs into the lungs every 6 (six) hours as needed for Wheezing.  17 g  12  . aspirin 81 MG tablet Take 81 mg by mouth daily.        Marland Kitchen doxazosin (CARDURA) 8 MG tablet Take 8 mg by mouth daily.       . finasteride (PROSCAR) 5 MG tablet Take 5 mg by mouth daily.       Marland Kitchen HYDROcodone-acetaminophen (NORCO) 5-325 MG per tablet Take 1 tablet by mouth daily as needed for pain.  20 tablet  0  . lisinopril (PRINIVIL,ZESTRIL) 20 MG tablet Take 20 mg by mouth daily.       Marland Kitchen LORazepam (ATIVAN) 1 MG tablet Take 1 tablet (1 mg total) by mouth daily as needed.  90 tablet  0  .  omeprazole (PRILOSEC) 20 MG capsule Take 20 mg by mouth 2 (two) times daily.       . polyethylene glycol (GLYCOLAX) powder MIX 17 GRAMS IN LIQUID ONCE DAILY  255 g  3  . senna (SENOKOT) 8.6 MG tablet Take 1 tablet by mouth 3 (three) times daily. Hold for loose stools       . tiotropium (SPIRIVA) 18 MCG inhalation capsule Place 1 capsule (18 mcg total) into inhaler and inhale daily.  90 capsule  3  . topiramate (TOPAMAX) 200 MG tablet Take by mouth daily. Taking 300 mg Daily       . zolpidem (AMBIEN) 5 MG tablet Take 5 mg by mouth at bedtime as needed.          patient denies chest pain, shortness of breath, orthopnea. Denies lower extremity edema, abdominal pain, change in appetite, change in bowel movements. Patient denies rashes, musculoskeletal complaints. No other specific complaints in a complete review of systems.   BP 144/80  Pulse 76  Temp(Src) 98 F (36.7 C) (Oral)  Ht 6\' 1"  (1.854 m)  Wt 202 lb (91.627 kg)  BMI 26.65 kg/m2  well-developed well-nourished male in no acute distress. HEENT exam  atraumatic, normocephalic, neck supple without jugular venous distention. Chest clear to auscultation cardiac exam S1-S2 are regular. Short 2/6 SEM. Abdominal exam overweight with bowel sounds, soft and nontender. Extremities no edema. Neurologic exam is alert with a normal gait.

## 2011-04-10 NOTE — Assessment & Plan Note (Signed)
No sxs Needs f/u labs Risk factor modification

## 2011-04-10 NOTE — Assessment & Plan Note (Signed)
BP Readings from Last 3 Encounters:  04/10/11 144/80  02/23/11 116/70  01/11/11 122/72   Home bps 120s/60s  Check bmet today

## 2011-04-13 ENCOUNTER — Other Ambulatory Visit: Payer: Self-pay | Admitting: Internal Medicine

## 2011-04-18 ENCOUNTER — Telehealth: Payer: Self-pay | Admitting: Internal Medicine

## 2011-04-18 NOTE — Telephone Encounter (Signed)
I gave pt 1 sample of spiriva. Pt states [facility] advised him they have not received his rx for his spiriva yet. I advised him according to our records it was sent on 04/09/11. I called [facility] to see and was on hold 15 minutes WCB

## 2011-04-18 NOTE — Telephone Encounter (Signed)
Pt in lobby, wants sample and wants to make sure rx was sent to pharm.  [name]

## 2011-04-19 MED ORDER — TIOTROPIUM BROMIDE MONOHYDRATE 18 MCG IN CAPS: 18.0000 ug | ORAL_CAPSULE | Freq: Every day | RESPIRATORY_TRACT | Status: DC

## 2011-04-19 NOTE — Telephone Encounter (Signed)
I was on hold with [facility] x 10 minutes and still was not able to reach anyone. I have sent a new rx. Pt is aware

## 2011-05-16 ENCOUNTER — Encounter (INDEPENDENT_AMBULATORY_CARE_PROVIDER_SITE_OTHER): Payer: Medicare PPO | Admitting: Ophthalmology

## 2011-05-16 DIAGNOSIS — H3530 Unspecified macular degeneration: Secondary | ICD-10-CM

## 2011-05-16 DIAGNOSIS — H353290 Exudative age-related macular degeneration, unspecified eye, stage unspecified: Secondary | ICD-10-CM

## 2011-05-16 DIAGNOSIS — H43819 Vitreous degeneration, unspecified eye: Secondary | ICD-10-CM

## 2011-05-21 ENCOUNTER — Encounter: Payer: Self-pay | Admitting: Internal Medicine

## 2011-05-21 ENCOUNTER — Ambulatory Visit (INDEPENDENT_AMBULATORY_CARE_PROVIDER_SITE_OTHER): Payer: Medicare PPO | Admitting: Internal Medicine

## 2011-05-21 VITALS — BP 128/68 | HR 84 | Temp 97.8°F | Ht 73.0 in | Wt 203.0 lb

## 2011-05-21 DIAGNOSIS — I2510 Atherosclerotic heart disease of native coronary artery without angina pectoris: Secondary | ICD-10-CM

## 2011-05-21 DIAGNOSIS — R9430 Abnormal result of cardiovascular function study, unspecified: Secondary | ICD-10-CM | POA: Insufficient documentation

## 2011-05-21 DIAGNOSIS — I270 Primary pulmonary hypertension: Secondary | ICD-10-CM

## 2011-05-21 DIAGNOSIS — R0989 Other specified symptoms and signs involving the circulatory and respiratory systems: Secondary | ICD-10-CM | POA: Insufficient documentation

## 2011-05-21 DIAGNOSIS — I10 Essential (primary) hypertension: Secondary | ICD-10-CM

## 2011-05-21 DIAGNOSIS — N259 Disorder resulting from impaired renal tubular function, unspecified: Secondary | ICD-10-CM

## 2011-05-21 DIAGNOSIS — E291 Testicular hypofunction: Secondary | ICD-10-CM

## 2011-05-21 DIAGNOSIS — R011 Cardiac murmur, unspecified: Secondary | ICD-10-CM

## 2011-05-21 NOTE — Assessment & Plan Note (Signed)
No sxs Continue daily exercise

## 2011-05-21 NOTE — Assessment & Plan Note (Signed)
Lab Results  Component Value Date   CREATININE 1.4 04/10/2011   Will continue to follow

## 2011-05-21 NOTE — Assessment & Plan Note (Signed)
Clinically doing well No need for further evaluation

## 2011-05-21 NOTE — Progress Notes (Signed)
Patient ID: [name], male   DOB: 08/20/1930, 78 y.o.   MRN: [id number] Patient Active Problem List  Diagnoses  .   .   . HYPERTENSION--tolerating meds BP: 128/68 mmHg    . CORONARY ARTERY DISEASE---no sxs, tolerating meds  . PULMONARY HYPERTENSION---no worsening of baseline SOB  .   .   . RENAL INSUFFICIENCYneeds regular f/u  .   Marland Kitchen   . Pulmonary fibrosis---reviewed previous imaging studies   Past Medical History  Diagnosis Date  . Kidney tumor (benign)   . Chronic headache   . Hypertension   . Nephrolithiasis   . Renal insufficiency   . Peripheral vascular disease   . BPH (benign prostatic hypertrophy)   . CAD (coronary artery disease)   . Detached retina   . Asthma   . Allergic rhinitis   . Sleep apnea   . Pulmonary fibrosis Jan 2009    very subtle possible fibrosis noted on CT Jan 2009.  Unchanged since Oct 2006. Deemed possibly due to chlorine exposure at gym work swimming pool  . COPD (chronic obstructive pulmonary disease)     2001 PFTs  - FEv1 2.5L/66%, DLCO 57% -> March 2009: Fev1 1.91L/625, Ratio 50, DLCO 50%    History   Social History  . Marital Status: Married    Spouse Name: N/A    Number of Children: N/A  . Years of Education: N/A   Occupational History  . 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 Smoking status: Former Smoker -- 1.0 packs/day for 50 years    Types: Cigarettes    Quit date: 04/23/1997  . Smokeless tobacco: Not on file  . Alcohol Use: No  . Drug Use: No  . Sexually Active: Not on file   Other Topics Concern  . Not on file   Social History Narrative   Pt states had chlorine exposure x 35 yrs.     Past Surgical History  Procedure Date  . Nephrectomy 2003     left partial, benign tumor  . Vasectomy   . Ptca 2001 and 2002  . Abdominal aortic aneurysm repair 1997  . Cataract extraction     bilateral  . Cardiac catheterization 07/22/02    EF 55%    Family History  Problem Relation Age of  Onset  . Aneurysm Father 52    deceased    Allergies  Allergen Reactions  . Beta Adrenergic Blockers     Current Outpatient Prescriptions on File Prior to Visit  Medication Sig Dispense Refill  . albuterol (PROVENTIL,VENTOLIN) 90 MCG/ACT inhaler Inhale 2 puffs into the lungs every 6 (six) hours as needed for Wheezing.  17 g  12  . aspirin 81 MG tablet Take 81 mg by mouth daily.        Marland Kitchen doxazosin (CARDURA) 8 MG tablet Take 1 tablet (8 mg total) by mouth daily.  90 tablet  3  . finasteride (PROSCAR) 5 MG tablet Take 1 tablet (5 mg total) by mouth daily.  90 tablet  3  . HYDROcodone-acetaminophen (NORCO) 5-325 MG per tablet Take 1 tablet by mouth daily as needed for pain.  20 tablet  0  . ibuprofen (ADVIL,MOTRIN) 800 MG tablet TAKE 1 TABLET TWICE A DAY WITH FOOD  60 tablet  2  . lisinopril (PRINIVIL,ZESTRIL) 20 MG tablet Take 1 tablet (20 mg total) by mouth daily.  90 tablet  3  . LORazepam (ATIVAN)  1 MG tablet Take 1 tablet (1 mg total) by mouth daily as needed.  90 tablet  0  . omeprazole (PRILOSEC) 20 MG capsule Take 1 capsule (20 mg total) by mouth 2 (two) times daily.  180 capsule  3  . polyethylene glycol (GLYCOLAX) powder MIX 17 GRAMS IN LIQUID ONCE DAILY  255 g  3  . senna (SENOKOT) 8.6 MG tablet Take 1 tablet by mouth 3 (three) times daily. Hold for loose stools       . tiotropium (SPIRIVA) 18 MCG inhalation capsule Place 1 capsule (18 mcg total) into inhaler and inhale daily.  90 capsule  3  . topiramate (TOPAMAX) 200 MG tablet Take 350 mg by mouth daily.       Marland Kitchen zolpidem (AMBIEN) 5 MG tablet Take 1 tablet (5 mg total) by mouth at bedtime as needed.  30 tablet  1     patient denies chest pain, shortness of breath, orthopnea. Denies lower extremity edema, abdominal pain, change in appetite, change in bowel movements. Patient denies rashes, musculoskeletal complaints. No other specific complaints in a complete review of systems.   BP 128/68  Pulse 84  Temp(Src) 97.8 F (36.6 C)  (Oral)  Ht 6\' 1"  (1.854 m)  Wt 203 lb (92.08 kg)  BMI 26.78 kg/m2  well-developed well-nourished male in no acute distress. HEENT exam atraumatic, normocephalic, neck supple without jugular venous distention. Chest clear to auscultation cardiac exam S1-S2 are regular. Abdominal exam overweight with bowel sounds, soft and nontender. Extremities no edema. Neurologic exam is alert with a normal gait.

## 2011-05-21 NOTE — Assessment & Plan Note (Signed)
Reasonable control Continue same meds 

## 2011-05-22 LAB — TESTOSTERONE: Testosterone: 156.85 ng/dL — ABNORMAL LOW (ref 350.00–890.00)

## 2011-05-25 ENCOUNTER — Other Ambulatory Visit: Payer: Self-pay | Admitting: *Deleted

## 2011-05-25 MED ORDER — TESTOSTERONE 30 MG/ACT TD SOLN: 1.0000 | Freq: Every day | TRANSDERMAL | Status: DC

## 2011-06-07 ENCOUNTER — Telehealth: Payer: Self-pay | Admitting: Family Medicine

## 2011-06-07 MED ORDER — TESTOSTERONE 20.25 MG/1.25GM (1.62%) TD GEL: 2.0000 | Freq: Every day | TRANSDERMAL | Status: DC

## 2011-06-07 NOTE — Telephone Encounter (Signed)
Addended by: Beverely Low on: 06/07/2011 04:36 PM   Modules accepted: Orders

## 2011-06-07 NOTE — Telephone Encounter (Signed)
Rx printed

## 2011-06-07 NOTE — Telephone Encounter (Signed)
Patient's Axiron prior Berkley Harvey was denied. Per [facility], it is not on the formulary. He must try Androgel first, or have a documented trial and failure of Androgel. Please advise.

## 2011-06-07 NOTE — Telephone Encounter (Signed)
androgel 1.62% 2 pumps applied to shoulders daily

## 2011-06-21 ENCOUNTER — Telehealth: Payer: Self-pay | Admitting: *Deleted

## 2011-06-21 NOTE — Telephone Encounter (Signed)
Pt is asking to speak to [place] re: his meds.  He was supposed to be getting them from [facility], and something has gone wrong.  He has not received anything and is giving out.

## 2011-06-22 MED ORDER — DOXAZOSIN MESYLATE 8 MG PO TABS: 8.0000 mg | ORAL_TABLET | Freq: Every day | ORAL | Status: DC

## 2011-06-22 MED ORDER — FINASTERIDE 5 MG PO TABS: 5.0000 mg | ORAL_TABLET | Freq: Every day | ORAL | Status: DC

## 2011-06-22 MED ORDER — OMEPRAZOLE 20 MG PO CPDR: 20.0000 mg | DELAYED_RELEASE_CAPSULE | Freq: Two times a day (BID) | ORAL | Status: DC

## 2011-06-22 MED ORDER — LISINOPRIL 20 MG PO TABS: 20.0000 mg | ORAL_TABLET | Freq: Every day | ORAL | Status: DC

## 2011-06-22 NOTE — Telephone Encounter (Signed)
Pt states that [facility] says they never received lisinopril, omeprazole, doxazosin, and finasteride rx's.  Sent in electronically

## 2011-06-25 ENCOUNTER — Encounter (INDEPENDENT_AMBULATORY_CARE_PROVIDER_SITE_OTHER): Payer: Medicare PPO | Admitting: Ophthalmology

## 2011-06-25 DIAGNOSIS — H3530 Unspecified macular degeneration: Secondary | ICD-10-CM

## 2011-06-25 DIAGNOSIS — H353290 Exudative age-related macular degeneration, unspecified eye, stage unspecified: Secondary | ICD-10-CM

## 2011-06-25 DIAGNOSIS — H43819 Vitreous degeneration, unspecified eye: Secondary | ICD-10-CM

## 2011-06-27 ENCOUNTER — Telehealth: Payer: Self-pay | Admitting: Internal Medicine

## 2011-06-27 NOTE — Telephone Encounter (Signed)
He was supposed to come 5 months after august 2012 visit. Pls give him first avail fucas

## 2011-07-03 NOTE — Telephone Encounter (Signed)
Pt set for 07-19-11 at 3:45pm. Carron Curie, CMA

## 2011-07-05 NOTE — Progress Notes (Signed)
HPI  [name] comes in for followup of his dyspnea on exertion. Unfortunately, his level III stress study was never scheduled. He offers no new complaints. Past Medical History  Diagnosis Date  . Kidney tumor (benign)   . Chronic headache   . Hypertension   . Nephrolithiasis   . Renal insufficiency   . Peripheral vascular disease   . BPH (benign prostatic hypertrophy)   . CAD (coronary artery disease)   . Detached retina   . Asthma   . Allergic rhinitis   . Sleep apnea   . Pulmonary fibrosis Jan 2009    very subtle possible fibrosis noted on CT Jan 2009.  Unchanged since Oct 2006. Deemed possibly due to chlorine exposure at gym work swimming pool  . COPD (chronic obstructive pulmonary disease)     2001 PFTs  - FEv1 2.5L/66%, DLCO 57% -> March 2009: Fev1 1.91L/625, Ratio 50, DLCO 50%    Current Outpatient Prescriptions  Medication Sig Dispense Refill  . aspirin 81 MG tablet Take 81 mg by mouth daily.        Marland Kitchen HYDROcodone-acetaminophen (NORCO) 5-325 MG per tablet Take 1 tablet by mouth daily as needed for pain.  20 tablet  0  . polyethylene glycol (GLYCOLAX) powder MIX 17 GRAMS IN LIQUID ONCE DAILY  255 g  3  . senna (SENOKOT) 8.6 MG tablet Take 1 tablet by mouth 3 (three) times daily. Hold for loose stools       . topiramate (TOPAMAX) 200 MG tablet Take 350 mg by mouth daily.       Marland Kitchen doxazosin (CARDURA) 8 MG tablet Take 1 tablet (8 mg total) by mouth daily.  90 tablet  3  . finasteride (PROSCAR) 5 MG tablet Take 1 tablet (5 mg total) by mouth daily.  90 tablet  3  . ibuprofen (ADVIL,MOTRIN) 800 MG tablet TAKE 1 TABLET TWICE A DAY WITH FOOD  60 tablet  2  . lisinopril (PRINIVIL,ZESTRIL) 20 MG tablet Take 1 tablet (20 mg total) by mouth daily.  90 tablet  3  . LORazepam (ATIVAN) 1 MG tablet Take 1 tablet (1 mg total) by mouth daily as needed.  90 tablet  0  . omeprazole (PRILOSEC) 20 MG capsule Take 1 capsule (20 mg total) by mouth 2 (two) times daily.  180 capsule  3  . Testosterone  (ANDROGEL) 20.25 MG/1.25GM (1.62%) GEL Place 2 Act onto the skin daily.  1.25 g  3  . tiotropium (SPIRIVA) 18 MCG inhalation capsule Place 1 capsule (18 mcg total) into inhaler and inhale daily.  90 capsule  3  . zolpidem (AMBIEN) 5 MG tablet Take 1 tablet (5 mg total) by mouth at bedtime as needed.  30 tablet  1    Allergies  Allergen Reactions  . Beta Adrenergic Blockers     Family History  Problem Relation Age of Onset  . Aneurysm Father 81    deceased    History   Social History  . Marital Status: Married    Spouse Name: N/A    Number of Children: N/A  . Years of Education: N/A   Occupational History  . 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 Smoking status: Former Smoker -- 1.0 packs/day for 50 years    Types: Cigarettes    Quit date: 04/23/1997  . Smokeless tobacco: Not on file  . Alcohol Use: No  . Drug Use: No  .  Sexually Active: Not on file   Other Topics Concern  . Not on file   Social History Narrative   Pt states had chlorine exposure x 35 yrs.     ROS ALL NEGATIVE EXCEPT THOSE NOTED IN HPI  PE  General Appearance: well developed, well nourished in no acute distress HEENT: symmetrical face, PERRLA, good dentition  Neck: no JVD, thyromegaly, or adenopathy, trachea midline Chest: symmetric without deformity Cardiac: PMI non-displaced, RRR, normal S1, S2, no gallop , soft diastolic murmur left upper sternal border Lung: clear to ausculation and percussion Vascular: all pulses full without bruits  Abdominal: nondistended, nontender, good bowel sounds, no HSM, no bruits Extremities: no cyanosis, clubbing or edema, no sign of DVT, no varicosities  Skin: normal color, no rashes Neuro: alert and oriented x 3, non-focal Pysch: normal affect  EKG  BMET    Component Value Date/Time   NA 143 04/10/2011 1116   K 4.8 04/10/2011 1116   CL 114* 04/10/2011 1116   CO2 24 04/10/2011 1116   GLUCOSE 104* 04/10/2011 1116   BUN  24* 04/10/2011 1116   CREATININE 1.4 04/10/2011 1116   CALCIUM 9.4 04/10/2011 1116   GFRNONAA 50.12 02/06/2010 1130   GFRAA  Value: 50        The eGFR has been calculated using the MDRD equation. This calculation has not been validated in all clinical situations. eGFR's persistently <60 mL/min signify possible Chronic Kidney Disease.* 01/05/2009 1836    Lipid Panel     Component Value Date/Time   CHOL 154 04/10/2011 1116   TRIG 182.0* 04/10/2011 1116   HDL 29.60* 04/10/2011 1116   CHOLHDL 5 04/10/2011 1116   VLDL 36.4 04/10/2011 1116   LDLCALC 88 04/10/2011 1116    CBC    Component Value Date/Time   WBC 6.8 04/10/2011 1116   RBC 4.71 04/10/2011 1116   HGB 14.8 04/10/2011 1116   HCT 43.2 04/10/2011 1116   PLT 128.0* 04/10/2011 1116   MCV 91.7 04/10/2011 1116   MCHC 34.2 04/10/2011 1116   RDW 14.9* 04/10/2011 1116   LYMPHSABS 1.5 04/10/2011 1116   MONOABS 0.7 04/10/2011 1116   EOSABS 0.2 04/10/2011 1116   BASOSABS 0.0 04/10/2011 1116

## 2011-07-19 ENCOUNTER — Ambulatory Visit: Payer: Medicare PPO | Admitting: Internal Medicine

## 2011-07-31 ENCOUNTER — Encounter: Payer: Self-pay | Admitting: Family

## 2011-07-31 ENCOUNTER — Ambulatory Visit (INDEPENDENT_AMBULATORY_CARE_PROVIDER_SITE_OTHER): Payer: Medicare PPO | Admitting: Family

## 2011-07-31 VITALS — BP 126/70 | Temp 98.4°F | Wt 201.0 lb

## 2011-07-31 DIAGNOSIS — L259 Unspecified contact dermatitis, unspecified cause: Secondary | ICD-10-CM

## 2011-07-31 MED ORDER — PREDNISONE 20 MG PO TABS: 40.0000 mg | ORAL_TABLET | Freq: Every day | ORAL | Status: AC

## 2011-07-31 NOTE — Patient Instructions (Signed)
Contact Dermatitis  Contact dermatitis is a rash that happens when something touches the skin. You touched something that irritates your skin, or you have allergies to something you touched.  HOME CARE    Avoid the thing that caused your rash.   Keep your rash away from hot water, soap, sunlight, chemicals, and other things that might bother it.   Do not scratch your rash.   You can take cool baths to help stop itching.   Only take medicine as told by your doctor.   Keep all doctor visits as told.  GET HELP RIGHT AWAY IF:    Your rash is not better after 3 days.   Your rash gets worse.   Your rash is puffy (swollen), tender, red, sore, or warm.   You have problems with your medicine.  MAKE SURE YOU:    Understand these instructions.   Will watch your condition.   Will get help right away if you are not doing well or get worse.  Document Released: 02/04/2009 Document Revised: 03/29/2011 Document Reviewed: 09/12/2010  [employer] Patient Information 2012 [employer]

## 2011-08-01 ENCOUNTER — Encounter: Payer: Self-pay | Admitting: Family

## 2011-08-01 NOTE — Progress Notes (Signed)
Subjective:    Patient ID: [name], male    DOB: Sep 16, 1930, 78 y.o.   MRN: [id number]  HPI Comments: 78 year old white male, nonsmoker, patient of Dr. sources in today with a rash on his chest and upper back and some present for 2 days. He denies any changes in detergents, soaps, lotions. No new foods. No new medications. He has not taken anything for his symptoms. Patient does note that his Doxazosin this month was white instead of blue.      Review of Systems  Constitutional: Negative.   Respiratory: Negative.   Cardiovascular: Negative.   Gastrointestinal: Negative.   Skin:       Trunk rash x 2 days.  Neurological: Negative.   Psychiatric/Behavioral: Negative.    Past Medical History  Diagnosis Date  . Kidney tumor (benign)   . Chronic headache   . Hypertension   . Nephrolithiasis   . Renal insufficiency   . Peripheral vascular disease   . BPH (benign prostatic hypertrophy)   . CAD (coronary artery disease)   . Detached retina   . Asthma   . Allergic rhinitis   . Sleep apnea   . Pulmonary fibrosis Jan 2009    very subtle possible fibrosis noted on CT Jan 2009.  Unchanged since Oct 2006. Deemed possibly due to chlorine exposure at gym work swimming pool  . COPD (chronic obstructive pulmonary disease)     2001 PFTs  - FEv1 2.5L/66%, DLCO 57% -> March 2009: Fev1 1.91L/625, Ratio 50, DLCO 50%    History   Social History  . Marital Status: Married    Spouse Name: N/A    Number of Children: N/A  . Years of Education: N/A   Occupational History  . 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 Smoking status: Former Smoker -- 1.0 packs/day for 50 years    Types: Cigarettes    Quit date: 04/23/1997  . Smokeless tobacco: Not on file  . Alcohol Use: No  . Drug Use: No  . Sexually Active: Not on file   Other Topics Concern  . Not on file   Social History Narrative   Pt states had chlorine exposure x 35 yrs.     Past Surgical  History  Procedure Date  . Nephrectomy 2003     left partial, benign tumor  . Vasectomy   . Ptca 2001 and 2002  . Abdominal aortic aneurysm repair 1997  . Cataract extraction     bilateral  . Cardiac catheterization 07/22/02    EF 55%    Family History  Problem Relation Age of Onset  . Aneurysm Father 47    deceased    Allergies  Allergen Reactions  . Beta Adrenergic Blockers     Current Outpatient Prescriptions on File Prior to Visit  Medication Sig Dispense Refill  . aspirin 81 MG tablet Take 81 mg by mouth daily.        Marland Kitchen doxazosin (CARDURA) 8 MG tablet Take 1 tablet (8 mg total) by mouth daily.  90 tablet  3  . finasteride (PROSCAR) 5 MG tablet Take 1 tablet (5 mg total) by mouth daily.  90 tablet  3  . HYDROcodone-acetaminophen (NORCO) 5-325 MG per tablet Take 1 tablet by mouth daily as needed for pain.  20 tablet  0  . ibuprofen (ADVIL,MOTRIN) 800 MG tablet TAKE 1 TABLET TWICE A DAY WITH FOOD  60 tablet  2  .  lisinopril (PRINIVIL,ZESTRIL) 20 MG tablet Take 1 tablet (20 mg total) by mouth daily.  90 tablet  3  . LORazepam (ATIVAN) 1 MG tablet Take 1 tablet (1 mg total) by mouth daily as needed.  90 tablet  0  . omeprazole (PRILOSEC) 20 MG capsule Take 1 capsule (20 mg total) by mouth 2 (two) times daily.  180 capsule  3  . polyethylene glycol (GLYCOLAX) powder MIX 17 GRAMS IN LIQUID ONCE DAILY  255 g  3  . senna (SENOKOT) 8.6 MG tablet Take 1 tablet by mouth 3 (three) times daily. Hold for loose stools       . Testosterone (ANDROGEL) 20.25 MG/1.25GM (1.62%) GEL Place 2 Act onto the skin daily.  1.25 g  3  . tiotropium (SPIRIVA) 18 MCG inhalation capsule Place 1 capsule (18 mcg total) into inhaler and inhale daily.  90 capsule  3  . topiramate (TOPAMAX) 200 MG tablet Take 350 mg by mouth daily.       Marland Kitchen zolpidem (AMBIEN) 5 MG tablet Take 1 tablet (5 mg total) by mouth at bedtime as needed.  30 tablet  1    BP 126/70  Temp(Src) 98.4 F (36.9 C) (Oral)  Wt 201 lb (91.173  kg)chart and    Objective:   Physical Exam  Constitutional: He is oriented to person, place, and time. He appears well-developed and well-nourished.  Neck: Normal range of motion. Neck supple.  Cardiovascular: Normal rate, regular rhythm and normal heart sounds.   Pulmonary/Chest: Effort normal.  Abdominal: Soft. Bowel sounds are normal.  Neurological: He is alert and oriented to person, place, and time.  Skin: Skin is warm and dry. Rash noted.       Pruritic, red, papular rash noted to the trunk area. No drainage or signs of infection.  Psychiatric: He has a normal mood and affect.          Assessment & Plan:  Assessment: Allergic reaction, contact dermatitis  Plan: Prednisone 20 mg tablets 2 by mouth x5 days. Hold doxazosin x 5 days. Patient called the office symptoms worsen or persist.

## 2011-08-06 ENCOUNTER — Encounter (INDEPENDENT_AMBULATORY_CARE_PROVIDER_SITE_OTHER): Payer: Medicare PPO | Admitting: Ophthalmology

## 2011-08-08 ENCOUNTER — Encounter (INDEPENDENT_AMBULATORY_CARE_PROVIDER_SITE_OTHER): Payer: Medicare PPO | Admitting: Ophthalmology

## 2011-08-08 DIAGNOSIS — H3530 Unspecified macular degeneration: Secondary | ICD-10-CM

## 2011-08-08 DIAGNOSIS — H353290 Exudative age-related macular degeneration, unspecified eye, stage unspecified: Secondary | ICD-10-CM

## 2011-08-08 DIAGNOSIS — H43819 Vitreous degeneration, unspecified eye: Secondary | ICD-10-CM

## 2011-08-21 ENCOUNTER — Other Ambulatory Visit: Payer: Self-pay | Admitting: Family Medicine

## 2011-08-23 ENCOUNTER — Telehealth: Payer: Self-pay | Admitting: Internal Medicine

## 2011-08-23 MED ORDER — LORAZEPAM 1 MG PO TABS: 1.0000 mg | ORAL_TABLET | Freq: Every day | ORAL | Status: DC | PRN

## 2011-08-23 NOTE — Telephone Encounter (Signed)
rx faxed to pharmacy

## 2011-08-23 NOTE — Telephone Encounter (Signed)
Pt requesting refill on LORazepam (ATIVAN) 1 MG tablet [Pharmacy Med Name: LORAZEPAM TAB 1MG ]  Please send to [facility]

## 2011-09-11 ENCOUNTER — Encounter (HOSPITAL_COMMUNITY): Payer: Self-pay | Admitting: Emergency Medicine

## 2011-09-11 ENCOUNTER — Emergency Department (HOSPITAL_COMMUNITY)
Admission: EM | Admit: 2011-09-11 | Discharge: 2011-09-12 | Disposition: A | Discharge status: Home / Self Care | Payer: Medicare PPO | Attending: Emergency Medicine | Admitting: Emergency Medicine

## 2011-09-11 DIAGNOSIS — I714 Abdominal aortic aneurysm, without rupture, unspecified: Secondary | ICD-10-CM | POA: Insufficient documentation

## 2011-09-11 DIAGNOSIS — R109 Unspecified abdominal pain: Secondary | ICD-10-CM

## 2011-09-11 DIAGNOSIS — I10 Essential (primary) hypertension: Secondary | ICD-10-CM | POA: Insufficient documentation

## 2011-09-11 DIAGNOSIS — I739 Peripheral vascular disease, unspecified: Secondary | ICD-10-CM | POA: Insufficient documentation

## 2011-09-11 DIAGNOSIS — R1032 Left lower quadrant pain: Secondary | ICD-10-CM | POA: Insufficient documentation

## 2011-09-11 DIAGNOSIS — J449 Chronic obstructive pulmonary disease, unspecified: Secondary | ICD-10-CM | POA: Insufficient documentation

## 2011-09-11 DIAGNOSIS — I2510 Atherosclerotic heart disease of native coronary artery without angina pectoris: Secondary | ICD-10-CM | POA: Insufficient documentation

## 2011-09-11 DIAGNOSIS — Z79899 Other long term (current) drug therapy: Secondary | ICD-10-CM | POA: Insufficient documentation

## 2011-09-11 DIAGNOSIS — Z7982 Long term (current) use of aspirin: Secondary | ICD-10-CM | POA: Insufficient documentation

## 2011-09-11 DIAGNOSIS — J4489 Other specified chronic obstructive pulmonary disease: Secondary | ICD-10-CM | POA: Insufficient documentation

## 2011-09-11 LAB — CBC
HCT: 43.7 % (ref 39.0–52.0)
Hemoglobin: 15.2 g/dL (ref 13.0–17.0)
MCH: 30.6 pg (ref 26.0–34.0)
RBC: 4.96 MIL/uL (ref 4.22–5.81)

## 2011-09-11 LAB — DIFFERENTIAL
Eosinophils Absolute: 0.2 10*3/uL (ref 0.0–0.7)
Lymphs Abs: 2.2 10*3/uL (ref 0.7–4.0)
Monocytes Absolute: 0.7 10*3/uL (ref 0.1–1.0)
Monocytes Relative: 10 % (ref 3–12)
Neutrophils Relative %: 53 % (ref 43–77)

## 2011-09-11 LAB — BASIC METABOLIC PANEL
BUN: 19 mg/dL (ref 6–23)
Chloride: 112 mEq/L (ref 96–112)
GFR calc non Af Amer: 47 mL/min — ABNORMAL LOW (ref >90)
Glucose, Bld: 112 mg/dL — ABNORMAL HIGH (ref 70–99)
Potassium: 3.9 mEq/L (ref 3.5–5.1)

## 2011-09-11 NOTE — ED Notes (Signed)
PT. REPORTS MID ABDOMINAL PAIN ONSET 3 WEEKS AGO ,DENIES NAUSEA OR VOMITTING , NO DIARRHEA , NO FEVER OR CHILLS.

## 2011-09-11 NOTE — ED Provider Notes (Signed)
History     CSN: [id number]  Arrival date & time 09/11/11  2156   First MD Initiated Contact with Patient 09/11/11 2257      Chief Complaint  Patient presents with  . Abdominal Pain    (Consider location/radiation/quality/duration/timing/severity/associated sxs/prior treatment) HPI Comments: 78 y/o male with hx of AAA repair 16 years ago presents with 3 days of gradual onset of a mild discomfort in the LLQ of the abdomen which is intermittent, mild, and persistent with occasional pains until today when the pain became more constant, severe.  Not assoicated with changes in bowel habits, dysuria, fevers, vomiting or back pain.  He has no hx of diverticulitis.  Currently the patient declines pain medications and has not had any pain medications prior to arrival.  Patient is a 78 y.o. male presenting with abdominal pain. The history is provided by the patient and a relative.  Abdominal Pain The primary symptoms of the illness include abdominal pain.    Past Medical History  Diagnosis Date  . Kidney tumor (benign)   . Chronic headache   . Hypertension   . Nephrolithiasis   . Renal insufficiency   . Peripheral vascular disease   . BPH (benign prostatic hypertrophy)   . CAD (coronary artery disease)   . Detached retina   . Asthma   . Allergic rhinitis   . Sleep apnea   . Pulmonary fibrosis Jan 2009    very subtle possible fibrosis noted on CT Jan 2009.  Unchanged since Oct 2006. Deemed possibly due to chlorine exposure at gym work swimming pool  . COPD (chronic obstructive pulmonary disease)     2001 PFTs  - FEv1 2.5L/66%, DLCO 57% -> March 2009: Fev1 1.91L/625, Ratio 50, DLCO 50%    Past Surgical History  Procedure Date  . Nephrectomy 2003     left partial, benign tumor  . Vasectomy   . Ptca 2001 and 2002  . Abdominal aortic aneurysm repair 1997  . Cataract extraction     bilateral  . Cardiac catheterization 07/22/02    EF 55%    Family History  Problem Relation Age  of Onset  . Aneurysm Father 67    deceased    History  Substance Use Topics  . Smoking status: Former Smoker -- 1.0 packs/day for 50 years    Types: Cigarettes    Quit date: 04/23/1997  . Smokeless tobacco: Not on file  . Alcohol Use: No      Review of Systems  Gastrointestinal: Positive for abdominal pain.  All other systems reviewed and are negative.    Allergies  Beta adrenergic blockers  Home Medications   Current Outpatient Rx  Name Route Sig Dispense Refill  . ASPIRIN 81 MG PO TABS Oral Take 81 mg by mouth daily.      Marland Kitchen DOXAZOSIN MESYLATE 8 MG PO TABS Oral Take 1 tablet (8 mg total) by mouth daily. 90 tablet 3  . FINASTERIDE 5 MG PO TABS Oral Take 1 tablet (5 mg total) by mouth daily. 90 tablet 3  . IBUPROFEN 800 MG PO TABS Oral Take 800 mg by mouth every 8 (eight) hours as needed. For pain    . LISINOPRIL 20 MG PO TABS Oral Take 1 tablet (20 mg total) by mouth daily. 90 tablet 3  . LORAZEPAM 1 MG PO TABS Oral Take 1 tablet (1 mg total) by mouth daily as needed. 90 tablet 0  . OMEPRAZOLE 20 MG PO CPDR Oral Take 1 capsule (  20 mg total) by mouth 2 (two) times daily. 180 capsule 3  . POLYETHYLENE GLYCOL 3350 PO PACK Oral Take 17 g by mouth 2 (two) times a week.    . SENNOSIDES 8.6 MG PO TABS Oral Take 1 tablet by mouth 3 (three) times daily as needed. Hold for loose stools    . TIOTROPIUM BROMIDE MONOHYDRATE 18 MCG IN CAPS Inhalation Place 1 capsule (18 mcg total) into inhaler and inhale daily. 90 capsule 3  . TOPIRAMATE 200 MG PO TABS Oral Take 150 mg by mouth at bedtime.     Marland Kitchen HYDROCODONE-ACETAMINOPHEN 5-325 MG PO TABS Oral Take 1 tablet by mouth daily as needed for pain. 20 tablet 0    NEEDS OV  . MAGNESIUM CITRATE PO SOLN Oral Take 296 mLs by mouth once. OTC 300 mL 0  . ZOLPIDEM TARTRATE 5 MG PO TABS Oral Take 1 tablet (5 mg total) by mouth at bedtime as needed. 30 tablet 1    BP 133/70  Pulse 73  Temp 97.5 F (36.4 C)  Resp 27  SpO2 96%  Physical Exam    Nursing note and vitals reviewed. Constitutional: He appears well-developed and well-nourished. No distress.  HENT:  Head: Normocephalic and atraumatic.  Mouth/Throat: Oropharynx is clear and moist. No oropharyngeal exudate.  Eyes: Conjunctivae and EOM are normal. Pupils are equal, round, and reactive to light. Right eye exhibits no discharge. Left eye exhibits no discharge. No scleral icterus.  Neck: Normal range of motion. Neck supple. No JVD present. No thyromegaly present.  Cardiovascular: Normal rate, regular rhythm, normal heart sounds and intact distal pulses.  Exam reveals no gallop and no friction rub.   No murmur heard. Pulmonary/Chest: Effort normal and breath sounds normal. No respiratory distress. He has no wheezes. He has no rales.  Abdominal: Soft. Bowel sounds are normal. He exhibits no distension and no mass. There is tenderness ( minimal abd ttp around the periumbilical area - has no guarding, no peritoneal signs, very soft - no pulsating mass.  no pain at McB point.).  Musculoskeletal: Normal range of motion. He exhibits no edema and no tenderness.  Lymphadenopathy:    He has no cervical adenopathy.  Neurological: He is alert. Coordination normal.  Skin: Skin is warm and dry. No rash noted. No erythema.  Psychiatric: He has a normal mood and affect. His behavior is normal.    ED Course  Procedures (including critical care time)  Labs Reviewed  CBC - Abnormal; Notable for the following:    Platelets 118 (*) PLATELET COUNT CONFIRMED BY SMEAR   All other components within normal limits  BASIC METABOLIC PANEL - Abnormal; Notable for the following:    Glucose, Bld 112 (*)    Creatinine, Ser 1.39 (*)    GFR calc non Af Amer 47 (*)    GFR calc Af Amer 54 (*)    All other components within normal limits  DIFFERENTIAL  URINALYSIS, ROUTINE W REFLEX MICROSCOPIC   Ct Abdomen Pelvis W Contrast  09/12/2011  *RADIOLOGY REPORT*  Clinical Data: Left lower quadrant and mid  abdominal pain.  CT ABDOMEN AND PELVIS WITH CONTRAST  Technique:  Multidetector CT imaging of the abdomen and pelvis was performed following the standard protocol during bolus administration of intravenous contrast.  Contrast: 80mL OMNIPAQUE IOHEXOL 300 MG/ML  SOLN  Comparison: 02/14/2005  Findings: Lung bases are predominately clear.  Coronary artery and aortic valve calcifications.  Mild cardiomegaly.  No pleural or pericardial effusion.  Lobular liver  contour with low attenuation, similar to prior. Cholelithiasis.  No definite evidence for cholecystitis.  No biliary ductal dilatation.  Unremarkable spleen.  Atrophic pancreas.  Unremarkable adrenal glands.  Marked cortical thinning on the left with areas of scarring.  To a lesser extent, cortical thinning is present on the right.  There is a simple cyst arising from the lower pole of the right kidney.  Mild right perinephric fat stranding.  No hydronephrosis or hydroureter.  Moderate stool burden.  Colonic diverticulosis.  No CT evidence for diverticulitis or colitis.  Normal appendix.  No bowel obstruction. No free intraperitoneal air or fluid.  Layering high attenuation/calcific density within the bladder. This includes an area of punctate calcium in proximity to the left UVJ.  Prostatomegaly.  Small fat containing inguinal hernias.  Tortuous aorta.  Aneurysmal dilatation at the level of the renal arteries, measuring up to 4.6 cm (4.0 cm previously).  The celiac axis, SMA, and renal arteries are patent.  Below the level of the aneurysm, the aortic graft repair with surrounding retroperitoneal stranding is similar to prior.  Similar size and configuration to the common iliac arteries with scattered atherosclerosis and mild dilatation.  The left common iliac artery measures up to 1.8 cm. The right common iliac artery measures up to 1.7 cm.  Multilevel degenerative changes of the imaged spine. No acute or aggressive appearing osseous lesion.  IMPRESSION: Status  post abdominal aortic aneurysm repair.  The aneurysm sac at the level of the renal artery origins measures 4.6 cm (4.0 cm previously).  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  Colonic diverticulosis without overt evidence for diverticulitis.  Atrophic left kidney.  Layering stones/calcific density within the bladder.  One of the stones is in close proximity to the left UVJ.  No overt hydroureter.  Prostatomegaly.  Original Report Authenticated By: Waneta Martins, M.D.     1. Abdominal pain   2. AAA (abdominal aortic aneurysm)       MDM  Well appearing, normal VS last BP was 141/75.  He has no guarding, minmal ttp and states that he doesn't want pain meds b/c not very bad right now - r/o diverticulitis and AAA reccurrence.  CT pending.  Findings discussed with the patient, will try a bowel regimen to help relieve the stool burden, he is aware of the increasing size of the aneurysm but at this point it does not look like it is much bigger than it was in the past, it has not ruptured and repeat abdominal exam is soft and nontender. The patient has accepted and agreed with the plan for discharge.  Discharge Prescriptions include:  Mag Citrate      Vida Roller, MD 09/12/11 0500

## 2011-09-12 ENCOUNTER — Emergency Department (HOSPITAL_COMMUNITY): Payer: Medicare PPO

## 2011-09-12 ENCOUNTER — Encounter (INDEPENDENT_AMBULATORY_CARE_PROVIDER_SITE_OTHER): Payer: Medicare PPO | Admitting: Ophthalmology

## 2011-09-12 DIAGNOSIS — H43819 Vitreous degeneration, unspecified eye: Secondary | ICD-10-CM

## 2011-09-12 DIAGNOSIS — H353290 Exudative age-related macular degeneration, unspecified eye, stage unspecified: Secondary | ICD-10-CM

## 2011-09-12 DIAGNOSIS — H3530 Unspecified macular degeneration: Secondary | ICD-10-CM

## 2011-09-12 LAB — URINALYSIS, ROUTINE W REFLEX MICROSCOPIC
Bilirubin Urine: NEGATIVE
Hgb urine dipstick: NEGATIVE
Specific Gravity, Urine: 1.016 (ref 1.005–1.030)
pH: 6.5 (ref 5.0–8.0)

## 2011-09-12 MED ORDER — MAGNESIUM CITRATE PO SOLN: 296.0000 mL | Freq: Once | ORAL | Status: DC

## 2011-09-12 MED ORDER — IOHEXOL 300 MG/ML  SOLN
80.0000 mL | Freq: Once | INTRAMUSCULAR | Status: AC | PRN
  Administered 2011-09-12: 80 mL via INTRAVENOUS

## 2011-09-12 MED ORDER — IOHEXOL 300 MG/ML  SOLN
20.0000 mL | INTRAMUSCULAR | Status: AC
  Administered 2011-09-12 (×2): 20 mL via ORAL

## 2011-09-12 NOTE — ED Notes (Signed)
Pt received PO contrast tolerating well

## 2011-09-12 NOTE — Discharge Instructions (Signed)
Your CT scan show that your aneurysm was slightly larger at 4.7 cm (last 4 cm). There was a large amount of stool in your colon but no other signs of infection, inflammation or appendicitis. Please let her Dr. know about these findings in the next couple of days, return to the hospital immediately for severe or worsening pain  You have been diagnosed with undifferentiated abdominal pain.  Abdominal pain can be caused by many things. Your caregiver evaluates the seriousness of your pain by an examination and possibly blood or urine tests and imaging (CT scan, x-rays, ultrasound). Many cases can be observed and treated at home after initial evaluation in the emergency department. Even though you are being discharged home, abdominal pain can be unpredictable. Therefore, you need a repeat exam if your pain does not resolve, returns, or worsens. Most patient's with abdominal pain do not need to be admitted to the hospital or have surgery, but serious problems like appendicitis and gallbladder attacks can start out as nonspecific pain. Many abdominal conditions cannot be diagnosed in 1 visit, so followup evaluations are very important.  Seek immediate medical attention if:  *The pain does not go away or becomes severe. *Temperature above 101 develops *Repeated vomiting occurs(multiple episodes) *The pain becomes localized to portions of the abdomen. The right side could possibly be appendicitis. In an adult, the left lower portion of the abdomen could be colitis or diverticulitis. *Blood is being passed in stools or vomit *Return also if you develop chest pain, difficulty breathing, dizziness or fainting, or become confused poorly responsive or inconsolable (young children).     If you do not have a physician, you should reference the below phone numbers and call in the morning to establish follow up care.  RESOURCE GUIDE  Dental Problems  Patients with Medicaid: [facility] [phone] [address]                                           [phone] [address] Phone:  [phone]                                                  Phone:  [phone]  If unable to pay or uninsured, contact:  [facility] or [facility]. to become qualified for the adult dental clinic.  Chronic Pain Problems Contact [facility]  [phone] Patients need to be referred by their primary care doctor.  Insufficient Money for Medicine Contact [employer]:  call "211" or [facility] [phone].  No Primary Care Doctor Call [facility]  [phone] Other agencies that provide inexpensive medical care    [facility]  [phone]    [facility]  [phone]    [facility]  [phone]    [facility]  [phone]    Planned Parenthood  [phone]    [facility]  [phone]  Psychological Services [facility]  [phone] [facility]  [phone] [facility]   [phone] (emergency services [phone])  Substance Abuse Resources [facility]  [phone] [facility] [phone] [facility] [phone] Floydene Flock [phone] Residential & Outpatient Substance Abuse Program  [phone]  Abuse/Neglect [facility] Child Abuse Hotline (630)490-8446 [facility] Child Abuse Hotline 520-080-5635 (After Hours)  Emergency Shelter [facility] [phone]  [facility] [phone] [facility] [phone]  MRSA Hotline #:   267-810-1877    [facility] Resources  [facility]     [employer]                          [facility] Dept. [address] [place]                       [address]      [address]  Blondell Reveal Phone:  [phone]                                   Phone:  [phone]                 Phone:  [phone]  [facility] Phone:  [phone]  [facility] Child Abuse Hotline [phone] (After Hours)

## 2011-09-12 NOTE — ED Notes (Signed)
Patient resting on stretcher , states he isn't having any pain at present.

## 2011-09-12 NOTE — ED Notes (Signed)
Pt resting comfortably with eyes closed resp even and non-labored awaiting CT scan

## 2011-09-13 ENCOUNTER — Telehealth: Payer: Self-pay

## 2011-09-13 NOTE — Telephone Encounter (Signed)
F/u visit from ED usual pain on left side of chest area.  Pt states a CT was done.  Pt states he would like f/u with Dr. Cato Mulligan about this.  Pt states the chest discomfort still comes and goes.  Pt states he has tenderness and occasional sharpe pain in the area.  Advised pt that if pain or tenderness gets worse or new symptoms occur pt needs to go back to ER.  Pt states he would like a f/u appt for next week.

## 2011-09-18 ENCOUNTER — Ambulatory Visit (INDEPENDENT_AMBULATORY_CARE_PROVIDER_SITE_OTHER): Payer: Medicare PPO | Admitting: Internal Medicine

## 2011-09-18 VITALS — BP 132/64 | Temp 98.3°F | Ht 73.0 in | Wt 203.0 lb

## 2011-09-18 DIAGNOSIS — I714 Abdominal aortic aneurysm, without rupture: Secondary | ICD-10-CM

## 2011-09-18 NOTE — Progress Notes (Signed)
Patient ID: [name], male   DOB: Jul 05, 1930, 78 y.o.   MRN: [id number]  ED visit for abd pain, hx of AAA. sxs have resolved. Sxs thought to be related to obstipation--- BMS are improved.  He has known AAA- reviewed CTscan  Past Medical History  Diagnosis Date  . Kidney tumor (benign)   . Chronic headache   . Hypertension   . Nephrolithiasis   . Renal insufficiency   . Peripheral vascular disease   . BPH (benign prostatic hypertrophy)   . CAD (coronary artery disease)   . Detached retina   . Asthma   . Allergic rhinitis   . Sleep apnea   . Pulmonary fibrosis Jan 2009    very subtle possible fibrosis noted on CT Jan 2009.  Unchanged since Oct 2006. Deemed possibly due to chlorine exposure at gym work swimming pool  . COPD (chronic obstructive pulmonary disease)     2001 PFTs  - FEv1 2.5L/66%, DLCO 57% -> March 2009: Fev1 1.91L/625, Ratio 50, DLCO 50%    History   Social History  . Marital Status: Married    Spouse Name: N/A    Number of Children: N/A  . Years of Education: N/A   Occupational History  . 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 Smoking status: Former Smoker -- 1.0 packs/day for 50 years    Types: Cigarettes    Quit date: 04/23/1997  . Smokeless tobacco: Not on file  . Alcohol Use: No  . Drug Use: No  . Sexually Active: Not on file   Other Topics Concern  . Not on file   Social History Narrative   Pt states had chlorine exposure x 35 yrs.     Past Surgical History  Procedure Date  . Nephrectomy 2003     left partial, benign tumor  . Vasectomy   . Ptca 2001 and 2002  . Abdominal aortic aneurysm repair 1997  . Cataract extraction     bilateral  . Cardiac catheterization 07/22/02    EF 55%    Family History  Problem Relation Age of Onset  . Aneurysm Father 76    deceased    Allergies  Allergen Reactions  . Beta Adrenergic Blockers     Current Outpatient Prescriptions on File Prior to Visit  Medication  Sig Dispense Refill  . aspirin 81 MG tablet Take 81 mg by mouth daily.        Marland Kitchen doxazosin (CARDURA) 8 MG tablet Take 1 tablet (8 mg total) by mouth daily.  90 tablet  3  . finasteride (PROSCAR) 5 MG tablet Take 1 tablet (5 mg total) by mouth daily.  90 tablet  3  . HYDROcodone-acetaminophen (NORCO) 5-325 MG per tablet Take 1 tablet by mouth daily as needed for pain.  20 tablet  0  . ibuprofen (ADVIL,MOTRIN) 800 MG tablet Take 800 mg by mouth every 8 (eight) hours as needed. For pain      . lisinopril (PRINIVIL,ZESTRIL) 20 MG tablet Take 1 tablet (20 mg total) by mouth daily.  90 tablet  3  . LORazepam (ATIVAN) 1 MG tablet Take 1 tablet (1 mg total) by mouth daily as needed.  90 tablet  0  . omeprazole (PRILOSEC) 20 MG capsule Take 1 capsule (20 mg total) by mouth 2 (two) times daily.  180 capsule  3  . polyethylene glycol (MIRALAX / GLYCOLAX) packet Take 17 g by mouth  2 (two) times a week.      . senna (SENOKOT) 8.6 MG tablet Take 1 tablet by mouth 3 (three) times daily as needed. Hold for loose stools      . tiotropium (SPIRIVA) 18 MCG inhalation capsule Place 1 capsule (18 mcg total) into inhaler and inhale daily.  90 capsule  3  . topiramate (TOPAMAX) 200 MG tablet Take 150 mg by mouth at bedtime.       Marland Kitchen zolpidem (AMBIEN) 5 MG tablet Take 1 tablet (5 mg total) by mouth at bedtime as needed.  30 tablet  1     patient denies chest pain, shortness of breath, orthopnea. Denies lower extremity edema, abdominal pain, change in appetite, change in bowel movements. Patient denies rashes, musculoskeletal complaints. No other specific complaints in a complete review of systems.   BP 132/64  Temp(Src) 98.3 F (36.8 C) (Oral)  Ht 6\' 1"  (1.854 m)  Wt 203 lb (92.08 kg)  BMI 26.78 kg/m2  well-developed well-nourished male in no acute distress. HEENT exam atraumatic, normocephalic, neck supple without jugular venous distention. Chest clear to auscultation cardiac exam S1-S2 are regular. Abdominal exam  overweight with bowel sounds, soft and nontender. Extremities no edema. Neurologic exam is alert with a normal gait.

## 2011-09-19 ENCOUNTER — Other Ambulatory Visit: Payer: Self-pay | Admitting: Internal Medicine

## 2011-09-19 NOTE — Assessment & Plan Note (Signed)
Abdominal pain in ED was likely not due to AAA and pain has resolved. Nevertheless AAA has enlarged.  Refer to vascular surgery

## 2011-09-21 ENCOUNTER — Encounter: Payer: Self-pay | Admitting: Vascular Surgery

## 2011-09-25 ENCOUNTER — Encounter: Payer: Self-pay | Admitting: Vascular Surgery

## 2011-09-26 ENCOUNTER — Telehealth: Payer: Self-pay | Admitting: Internal Medicine

## 2011-09-26 ENCOUNTER — Ambulatory Visit (INDEPENDENT_AMBULATORY_CARE_PROVIDER_SITE_OTHER): Payer: Medicare PPO | Admitting: Vascular Surgery

## 2011-09-26 ENCOUNTER — Encounter: Payer: Self-pay | Admitting: Vascular Surgery

## 2011-09-26 VITALS — BP 144/74 | HR 82 | Temp 98.2°F | Ht 73.0 in | Wt 200.0 lb

## 2011-09-26 DIAGNOSIS — I714 Abdominal aortic aneurysm, without rupture, unspecified: Secondary | ICD-10-CM

## 2011-09-26 DIAGNOSIS — N200 Calculus of kidney: Secondary | ICD-10-CM

## 2011-09-26 NOTE — Telephone Encounter (Signed)
Pt  Feels that he has a kidney stone and is requesting a referral to urology

## 2011-09-26 NOTE — Telephone Encounter (Signed)
Ok per Dr. Swords, referral order placed 

## 2011-09-26 NOTE — Progress Notes (Signed)
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 male who underwent repair of a ruptured abdominal aortic aneurysm by Dr. Mills Koller in 1997. He had a CT scan in 2006 which showed that he had some dilatation of his pararenal aorta which measured 3.9 cm at that time. In 2009 he had a CT of the chest which also covered the pararenal aorta and showed that this segment measured 4.0 cm in maximum diameter. Recently he had some left-sided abdominal pain and underwent a CT scan to workup this pain. He was noted that the pararenal segment was now dilated to 4.9 cm. There was no evidence of retroperitoneal hematoma or acute expansion. He was noted to have kidney stones which likely explains his symptoms. He is sent for vascular consultation.   He states that his left lower quadrant pain has essentially resolved. His head multiple kidney stones in the past and feels that these symptoms were very similar. The pain was fairly localized the left lower quadrant and described a sharp pain. He did radiate somewhat to his left flank at that time.  Past Medical History  Diagnosis Date  . Kidney tumor (benign)   . Chronic headache   . Hypertension   . Nephrolithiasis   . Renal insufficiency   . Peripheral vascular disease   . BPH (benign prostatic hypertrophy)   . CAD (coronary artery disease)   . Detached retina   . Asthma   . Allergic rhinitis   . Sleep apnea   . Pulmonary fibrosis Jan 2009    very subtle possible fibrosis noted on CT Jan 2009.  Unchanged since Oct 2006. Deemed possibly due to chlorine exposure at gym work swimming pool  . COPD (chronic obstructive pulmonary disease)     2001 PFTs  - FEv1 2.5L/66%, DLCO 57% -> March 2009: Fev1 1.91L/625, Ratio 50, DLCO 50%  . AAA (abdominal aortic aneurysm)     Rupture 16 years ago  . History of  colonic diverticulitis   . Aneurysm, common iliac artery   . Myocardial infarction     minor in 2000, 2001    Family History  Problem Relation Age of Onset  . Aneurysm Father 59    deceased  . Other Father     AAA  . Hypertension Mother   . Other Mother     amputation (leg)    SOCIAL HISTORY: History  Substance Use Topics  . Smoking status: Former Smoker -- 1.0 packs/day for 50 years    Types: Cigarettes    Quit date: 04/23/1997  . Smokeless tobacco: Never Used  . Alcohol Use: No    Allergies  Allergen Reactions  . Beta Adrenergic Blockers     Current Outpatient Prescriptions  Medication Sig Dispense Refill  . aspirin 81 MG tablet Take 81 mg by mouth daily.        Marland Kitchen doxazosin (CARDURA) 8 MG tablet Take 1 tablet (8 mg total) by mouth daily.  90 tablet  3  . finasteride (PROSCAR) 5 MG tablet Take 1 tablet (5 mg total) by mouth daily.  90 tablet  3  . HYDROcodone-acetaminophen (NORCO) 5-325 MG per tablet Take 1 tablet by mouth daily as needed for pain.  20 tablet  0  . ibuprofen (ADVIL,MOTRIN) 800 MG tablet Take 800 mg by mouth every 8 (eight) hours as  needed. For pain      . lisinopril (PRINIVIL,ZESTRIL) 20 MG tablet Take 1 tablet (20 mg total) by mouth daily.  90 tablet  3  . LORazepam (ATIVAN) 1 MG tablet Take 1 tablet (1 mg total) by mouth daily as needed.  90 tablet  0  . omeprazole (PRILOSEC) 20 MG capsule Take 1 capsule (20 mg total) by mouth 2 (two) times daily.  180 capsule  3  . polyethylene glycol (MIRALAX / GLYCOLAX) packet Take 17 g by mouth 2 (two) times a week.      . polyethylene glycol powder (GLYCOLAX/MIRALAX) powder MIX 17 GRAMS IN LIQUID ONCE DAILY  255 g  2  . senna (SENOKOT) 8.6 MG tablet Take 1 tablet by mouth 3 (three) times daily as needed. Hold for loose stools      . tiotropium (SPIRIVA) 18 MCG inhalation capsule Place 1 capsule (18 mcg total) into inhaler and inhale daily.  90 capsule  3  . topiramate (TOPAMAX) 200 MG tablet Take 150 mg by mouth  at bedtime.       Marland Kitchen zolpidem (AMBIEN) 5 MG tablet Take 1 tablet (5 mg total) by mouth at bedtime as needed.  30 tablet  1    REVIEW OF SYSTEMS: Arly.Keller ] denotes positive finding; [  ] denotes negative finding  CARDIOVASCULAR:  [ ]  chest pain   [ ]  chest pressure   [ ]  palpitations   Arly.Keller ] orthopnea   Arly.Keller ] dyspnea on exertion   [ ]  claudication   [ ]  rest pain   [ ]  DVT   [ ]  phlebitis PULMONARY:   [ ]  productive cough   Arly.Keller ] asthma   [ ]  wheezing NEUROLOGIC:   [ ]  weakness  [ ]  paresthesias  [ ]  aphasia  [ ]  amaurosis  [ ]  dizziness HEMATOLOGIC:   [ ]  bleeding problems   [ ]  clotting disorders MUSCULOSKELETAL:  [ ]  joint pain   [ ]  joint swelling [ ]  leg swelling GASTROINTESTINAL: [ ]   blood in stool  [ ]   hematemesis GENITOURINARY:  Arly.Keller ]  dysuria  [ ]   hematuria PSYCHIATRIC:  [ ]  history of major depression INTEGUMENTARY:  [ ]  rashes  [ ]  ulcers CONSTITUTIONAL:  [ ]  fever   [ ]  chills  PHYSICAL EXAM: Filed Vitals:   09/26/11 1024  BP: 144/74  Pulse: 82  Temp: 98.2 F (36.8 C)  TempSrc: Oral  Height: 6\' 1"  (1.854 m)  Weight: 200 lb (90.719 kg)  SpO2: 96%   Body mass index is 26.39 kg/(m^2). GENERAL: The patient is a well-nourished male, in no acute distress. The vital signs are documented above. CARDIOVASCULAR: There is a regular rate and rhythm without significant murmur appreciated. I do not detect carotid bruits. He has femoral, popliteal, and dorsalis pedis pulses bilaterally. He has a right posterior tibial pulse. I cannot palpate a left posterior tibial pulse. PULMONARY: There is good air exchange bilaterally without wheezing or rales. ABDOMEN: Soft and non-tender with normal pitched bowel sounds. He has a ventral hernia. MUSCULOSKELETAL: There are no major deformities or cyanosis. NEUROLOGIC: No focal weakness or paresthesias are detected. SKIN: There are no ulcers or rashes noted. PSYCHIATRIC: The patient has a normal affect.  DATA:  Lab Results  Component Value Date    WBC 6.5 09/11/2011   HGB 15.2 09/11/2011   HCT 43.7 09/11/2011   MCV 88.1 09/11/2011   PLT 118* 09/11/2011   Lab Results  Component Value  Date   NA 144 09/11/2011   K 3.9 09/11/2011   CL 112 09/11/2011   CO2 22 09/11/2011   Lab Results  Component Value Date   CREATININE 1.39* 09/11/2011   I have reviewed his CT scan of the abdomen. This shows that his pararenal aorta measures 4.6 centimeters in maximum diameter. The aorta narrows below that where he has an aortic graft from previous aneurysm repair. The pararenal aneurysm extends up to the level of the superior mesenteric artery. I have compared this scan to his CT scan of the chest and 2009 and his previous CT of the abdomen done in 2006. This pararenal segment has enlarged 6 mm essentially over the last 4 years.  MEDICAL ISSUES:   4.6 CM PARARENAL ANEURYSM: Given that this is a pararenal aneurysm, repair would not be a simple in this 78 year old gentleman. It did not look like this could be done with an endovascular approach, and open repair may well require a thoracoabdominal approach. Given that this is only enlarged 6 mm over the last 4 years I explained I think likely this would be safe to follow and only consider repair if it enlarged. However, I've explained that we have referred our thoracoabdominal aneurysms to [facility] to Dr. Bennie Pierini and have recommended that we arrange a visit with Dr. Pattricia Boss. I also explained that I do not think this explains the left lower quadrant pain that he had in this is likely related to his kidney stones. We'll make arrangements for his appointment in [facility].    Akaisha Truman S [facility] [place] Beeper: [phone]

## 2011-10-19 ENCOUNTER — Ambulatory Visit (INDEPENDENT_AMBULATORY_CARE_PROVIDER_SITE_OTHER): Payer: Medicare PPO | Admitting: Internal Medicine

## 2011-10-19 ENCOUNTER — Encounter: Payer: Self-pay | Admitting: Internal Medicine

## 2011-10-19 VITALS — BP 140/80 | HR 84 | Temp 98.1°F | Wt 201.0 lb

## 2011-10-19 DIAGNOSIS — J449 Chronic obstructive pulmonary disease, unspecified: Secondary | ICD-10-CM

## 2011-10-19 DIAGNOSIS — I10 Essential (primary) hypertension: Secondary | ICD-10-CM

## 2011-10-19 DIAGNOSIS — N400 Enlarged prostate without lower urinary tract symptoms: Secondary | ICD-10-CM

## 2011-10-19 DIAGNOSIS — R300 Dysuria: Secondary | ICD-10-CM

## 2011-10-19 LAB — POCT URINALYSIS DIPSTICK
Blood, UA: NEGATIVE
Ketones, UA: NEGATIVE
Protein, UA: NEGATIVE
Spec Grav, UA: 1.02
Urobilinogen, UA: >=8
pH, UA: 7

## 2011-10-19 MED ORDER — CIPROFLOXACIN HCL 500 MG PO TABS: 500.0000 mg | ORAL_TABLET | Freq: Two times a day (BID) | ORAL | Status: AC

## 2011-10-19 NOTE — Patient Instructions (Signed)
Take your antibiotic as prescribed until ALL of it is gone, but stop if you develop a rash, swelling, or any side effects of the medication.  Contact our office as soon as possible if  there are side effects of the medication.  Call or return to clinic prn if these symptoms worsen or fail to improve as anticipated.   Limit your sodium (Salt) intake

## 2011-10-19 NOTE — Progress Notes (Signed)
Subjective:    Patient ID: [name], male    DOB: 1931-03-08, 78 y.o.   MRN: [id number]  HPI  78 year old patient who presents with a one-week history of burning dysuria he also describes some abdominal and back pain. He was seen in the ED last month and abdominal CT scan was obtained. His main complaint is weakness dysuria frequency and decrease in urinary flow. He does have a history of BPH and is both on doxazosin as well as finasteride.  Past Medical History  Diagnosis Date  . Kidney tumor (benign)   . Chronic headache   . Hypertension   . Nephrolithiasis   . Renal insufficiency   . Peripheral vascular disease   . BPH (benign prostatic hypertrophy)   . CAD (coronary artery disease)   . Detached retina   . Asthma   . Allergic rhinitis   . Sleep apnea   . Pulmonary fibrosis Jan 2009    very subtle possible fibrosis noted on CT Jan 2009.  Unchanged since Oct 2006. Deemed possibly due to chlorine exposure at gym work swimming pool  . COPD (chronic obstructive pulmonary disease)     2001 PFTs  - FEv1 2.5L/66%, DLCO 57% -> March 2009: Fev1 1.91L/625, Ratio 50, DLCO 50%  . AAA (abdominal aortic aneurysm)     Rupture 16 years ago  . History of colonic diverticulitis   . Aneurysm, common iliac artery   . Myocardial infarction     minor in 2000, 2001    History   Social History  . Marital Status: Married    Spouse Name: N/A    Number of Children: N/A  . Years of Education: N/A   Occupational History  . 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 Smoking status: Former Smoker -- 1.0 packs/day for 50 years    Types: Cigarettes    Quit date: 04/23/1997  . Smokeless tobacco: Never Used  . Alcohol Use: No  . Drug Use: No  . Sexually Active: Not on file   Other Topics Concern  . Not on file   Social History Narrative   Pt states had chlorine exposure x 35 yrs.     Past Surgical History  Procedure Date  . Nephrectomy 2003     left partial,  benign tumor  . Vasectomy   . Ptca 2001 and 2002  . Abdominal aortic aneurysm repair 1997  . Cataract extraction     bilateral  . Cardiac catheterization 07/22/02    EF 55%  . Abdominal aortic aneurysm repair   . Eye surgery     Family History  Problem Relation Age of Onset  . Aneurysm Father 47    deceased  . Other Father     AAA  . Hypertension Mother   . Other Mother     amputation (leg)    Allergies  Allergen Reactions  . Beta Adrenergic Blockers     Current Outpatient Prescriptions on File Prior to Visit  Medication Sig Dispense Refill  . aspirin 81 MG tablet Take 81 mg by mouth daily.        Marland Kitchen doxazosin (CARDURA) 8 MG tablet Take 1 tablet (8 mg total) by mouth daily.  90 tablet  3  . finasteride (PROSCAR) 5 MG tablet Take 1 tablet (5 mg total) by mouth daily.  90 tablet  3  . HYDROcodone-acetaminophen (NORCO) 5-325 MG per tablet Take 1 tablet by mouth  daily as needed for pain.  20 tablet  0  . ibuprofen (ADVIL,MOTRIN) 800 MG tablet Take 800 mg by mouth every 8 (eight) hours as needed. For pain      . lisinopril (PRINIVIL,ZESTRIL) 20 MG tablet Take 1 tablet (20 mg total) by mouth daily.  90 tablet  3  . LORazepam (ATIVAN) 1 MG tablet Take 1 tablet (1 mg total) by mouth daily as needed.  90 tablet  0  . omeprazole (PRILOSEC) 20 MG capsule Take 1 capsule (20 mg total) by mouth 2 (two) times daily.  180 capsule  3  . polyethylene glycol (MIRALAX / GLYCOLAX) packet Take 17 g by mouth 2 (two) times a week.      . polyethylene glycol powder (GLYCOLAX/MIRALAX) powder MIX 17 GRAMS IN LIQUID ONCE DAILY  255 g  2  . senna (SENOKOT) 8.6 MG tablet Take 1 tablet by mouth 3 (three) times daily as needed. Hold for loose stools      . tiotropium (SPIRIVA) 18 MCG inhalation capsule Place 1 capsule (18 mcg total) into inhaler and inhale daily.  90 capsule  3  . topiramate (TOPAMAX) 200 MG tablet Take 150 mg by mouth at bedtime.       Marland Kitchen zolpidem (AMBIEN) 5 MG tablet Take 1 tablet (5 mg  total) by mouth at bedtime as needed.  30 tablet  1    BP 140/80  Pulse 84  Temp 98.1 F (36.7 C) (Oral)  Wt 201 lb (91.173 kg)  SpO2 95%       Review of Systems  Genitourinary: Positive for dysuria, frequency, decreased urine volume and difficulty urinating.  Neurological: Positive for weakness.       Objective:   Physical Exam  Constitutional: He is oriented to person, place, and time. He appears well-developed.  HENT:  Head: Normocephalic.  Right Ear: External ear normal.  Left Ear: External ear normal.  Eyes: Conjunctivae and EOM are normal.  Neck: Normal range of motion.  Cardiovascular: Normal rate and normal heart sounds.   Pulmonary/Chest: Effort normal and breath sounds normal.       Few rhonchi  Diminished breath sounds  Abdominal: Soft. Bowel sounds are normal. He exhibits no distension. There is no tenderness.       Midline scar  Musculoskeletal: Normal range of motion. He exhibits no edema and no tenderness.  Neurological: He is alert and oriented to person, place, and time.  Psychiatric: He has a normal mood and affect. His behavior is normal.          Assessment & Plan:   Burning dysuria. UA is normal.  Possible early prostatitis. We'll treat with a short course of antibiotics and observe over the weekend Hypertension stable Coronary artery disease

## 2011-10-24 ENCOUNTER — Encounter (INDEPENDENT_AMBULATORY_CARE_PROVIDER_SITE_OTHER): Payer: Medicare PPO | Admitting: Ophthalmology

## 2011-10-24 DIAGNOSIS — H43399 Other vitreous opacities, unspecified eye: Secondary | ICD-10-CM

## 2011-10-24 DIAGNOSIS — H3530 Unspecified macular degeneration: Secondary | ICD-10-CM

## 2011-10-24 DIAGNOSIS — H35369 Drusen (degenerative) of macula, unspecified eye: Secondary | ICD-10-CM

## 2011-10-24 DIAGNOSIS — H3560 Retinal hemorrhage, unspecified eye: Secondary | ICD-10-CM

## 2011-10-24 DIAGNOSIS — H353290 Exudative age-related macular degeneration, unspecified eye, stage unspecified: Secondary | ICD-10-CM

## 2011-10-24 DIAGNOSIS — H35439 Paving stone degeneration of retina, unspecified eye: Secondary | ICD-10-CM

## 2011-10-24 DIAGNOSIS — H3540 Unspecified peripheral retinal degeneration: Secondary | ICD-10-CM

## 2011-10-24 DIAGNOSIS — H3589 Other specified retinal disorders: Secondary | ICD-10-CM

## 2011-11-03 ENCOUNTER — Other Ambulatory Visit: Payer: Self-pay | Admitting: Internal Medicine

## 2011-11-16 ENCOUNTER — Telehealth: Payer: Self-pay | Admitting: Internal Medicine

## 2011-11-16 MED ORDER — DOXYCYCLINE HYCLATE 100 MG PO TABS: 100.0000 mg | ORAL_TABLET | Freq: Two times a day (BID) | ORAL | Status: AC

## 2011-11-16 NOTE — Telephone Encounter (Signed)
Please advise 

## 2011-11-16 NOTE — Telephone Encounter (Signed)
rx sent in electronically to [facility], pt aware 

## 2011-11-16 NOTE — Telephone Encounter (Signed)
Trial doxycycline 100 mg po bid for 10 days to treat possibility of atypical prostate infection

## 2011-11-16 NOTE — Telephone Encounter (Signed)
Caller: [name]/Patient; PCP: Birdie Sons; CB#: [phone]; ; ; Call regarding Urinary Pain/Bleeding; Difficulty starting urination, burning  and pain starting again 11/14/11.  Full feeling, slight pain in lower back.  Seen in office for this 6/28 with round of Cipro.   Has appt with Urologist but unable to see until 11/23/11.  Afebrile. Would like to know if he could have another round of antibiotics to help until seen by Urologist?  Per Urinary Symptoms - Male Protocol all emergent symptoms ruled out with exception of "Urinary tract symptoms and any flank or low back, penis or scrotal pain."  Home care advice given.  Disposition to be seen within 24 hrs.  Note via Epic since patient has been evaluated and treated recently.  Please call with recommendations for appt. or rx.

## 2011-11-30 ENCOUNTER — Telehealth: Payer: Self-pay | Admitting: Internal Medicine

## 2011-11-30 ENCOUNTER — Other Ambulatory Visit: Payer: Self-pay | Admitting: Internal Medicine

## 2011-11-30 NOTE — Telephone Encounter (Signed)
Caller: Adama/Patient; Patient Name: [name]; PCP: Birdie Sons; Best Callback Phone Number: [phone] Health history, medications, and allergies reviewed: yes in EPIC.  Reason for call with symptoms and associated symptoms: Caller would like another Refill of Doxycycline. Per EPIC note he had a refill on 7/26 (bid x10 days) and completed as directed. Now has back pain, nausea, dysuria. Hematuria that began after MD (Urology office-no note re appt in EPIC)  attempted to insert catheter on Tues 8/6). He was unable to pass catheter.   He is not sure why MD did not give him antibxs as he had sxs and advised MD of same.  Date of onset of symptoms: Wed 8/7 after he completed meds on Monday 8/5. Any treatments/meds tried for symptoms and did they work, (please document med, dosage and time): as above.  Guideline viewed: Urinary Sxs-Male Disposition: See in 24 hrs. RN to send note for antibxs as requested since he has been seen and treated in recent past for this chronic issue. [facility] [place].  Advice given: Note will be sent for review by PCP. Increase fluids and take prn pain meds that he has on hand.  Call back if:

## 2011-12-03 ENCOUNTER — Encounter: Payer: Self-pay | Admitting: Family Medicine

## 2011-12-03 ENCOUNTER — Ambulatory Visit (INDEPENDENT_AMBULATORY_CARE_PROVIDER_SITE_OTHER): Payer: Medicare PPO | Admitting: Family Medicine

## 2011-12-03 VITALS — BP 150/82 | Temp 97.8°F | Wt 200.0 lb

## 2011-12-03 DIAGNOSIS — R300 Dysuria: Secondary | ICD-10-CM

## 2011-12-03 LAB — POCT URINALYSIS DIPSTICK
Bilirubin, UA: NEGATIVE
Ketones, UA: NEGATIVE
Spec Grav, UA: 1.01
pH, UA: 7

## 2011-12-03 MED ORDER — CIPROFLOXACIN HCL 500 MG PO TABS: 500.0000 mg | ORAL_TABLET | Freq: Two times a day (BID) | ORAL | Status: DC

## 2011-12-03 NOTE — Patient Instructions (Addendum)

## 2011-12-03 NOTE — Progress Notes (Signed)
  Subjective:    Patient ID: [name], male    DOB: 1930/07/10, 78 y.o.   MRN: [id number]  HPI  Acute visit. Dysuria for 2-3 days. Patient was seen by urologist and apparently last Tuesday had attempted cystoscopy. He has history of kidney stones. He's had burning with urination some frequency. No nausea or vomiting. No gross hematuria. No fever or chills. Patient was treated back in June with Cipro and got somewhat better. In July he called in with possible urinary symptoms was treated with doxycycline and again temporarily improved.   Review of Systems  Constitutional: Negative for fever and chills.  Gastrointestinal: Negative for nausea, vomiting and abdominal pain.  Genitourinary: Positive for dysuria. Negative for hematuria.       Objective:   Physical Exam  Constitutional: He appears well-developed and well-nourished.  Cardiovascular: Normal rate and regular rhythm.   Pulmonary/Chest: Effort normal and breath sounds normal. No respiratory distress. He has no wheezes. He has no rales.  Musculoskeletal:       No flank tenderness          Assessment & Plan:  Dysuria in patient with recent instrumentation for attempted cystoscopy. Urine dipstick suggest possible UTI. Urine culture sent. Cipro 500 mg twice a day for 7 days.

## 2011-12-05 ENCOUNTER — Encounter (INDEPENDENT_AMBULATORY_CARE_PROVIDER_SITE_OTHER): Payer: Medicare PPO | Admitting: Ophthalmology

## 2011-12-05 DIAGNOSIS — H353290 Exudative age-related macular degeneration, unspecified eye, stage unspecified: Secondary | ICD-10-CM

## 2011-12-05 DIAGNOSIS — H43819 Vitreous degeneration, unspecified eye: Secondary | ICD-10-CM

## 2011-12-05 DIAGNOSIS — H3530 Unspecified macular degeneration: Secondary | ICD-10-CM

## 2011-12-05 DIAGNOSIS — H35039 Hypertensive retinopathy, unspecified eye: Secondary | ICD-10-CM

## 2011-12-05 LAB — URINE CULTURE

## 2011-12-13 ENCOUNTER — Telehealth: Payer: Self-pay | Admitting: Internal Medicine

## 2011-12-13 NOTE — Telephone Encounter (Signed)
Pt is playing phone tag with CAN and is very concerned. He has had issues in the past with a bladder infection and is till having issues. Pt states that when he urinates he is seeing black specks in his urine anywhere in size from 1/8th of an inch to 1/32 of an inch and sees 4+ each time. Pt is also experiencing Burning when he urinates. Please contact

## 2011-12-13 NOTE — Telephone Encounter (Signed)
appt scheduled with Dr Caryl Never tomorrow

## 2011-12-14 ENCOUNTER — Encounter: Payer: Self-pay | Admitting: Family Medicine

## 2011-12-14 ENCOUNTER — Ambulatory Visit (INDEPENDENT_AMBULATORY_CARE_PROVIDER_SITE_OTHER): Payer: Medicare PPO | Admitting: Family Medicine

## 2011-12-14 VITALS — BP 130/70 | Temp 98.5°F | Wt 199.0 lb

## 2011-12-14 DIAGNOSIS — R300 Dysuria: Secondary | ICD-10-CM

## 2011-12-14 LAB — POCT URINALYSIS DIPSTICK
Glucose, UA: NEGATIVE
Nitrite, UA: NEGATIVE
Urobilinogen, UA: 0.2

## 2011-12-14 MED ORDER — CIPROFLOXACIN HCL 500 MG PO TABS: 500.0000 mg | ORAL_TABLET | Freq: Two times a day (BID) | ORAL | Status: DC

## 2011-12-14 NOTE — Progress Notes (Signed)
  Subjective:    Patient ID: [name], male    DOB: 03/24/1931, 78 y.o.   MRN: [id number]  HPI  Patient seen with recurrent dysuria. Refer to prior note. Patient had recent attempted cystoscopy. This was unsuccessful. He had CT scan back in May which showed probable urinary bladder stones. He was seen recently with dysuria and urine culture revealed 75,000 colonies mixed species. Symptomatically improved with Cipro.  3 days ago he passed some small solid black flecks which he described as possible stones. Since then he has had burning with urination. No fevers or chills. No nausea or vomiting. No penile discharge.    Review of Systems  Constitutional: Negative for fever and chills.  Gastrointestinal: Negative for nausea, vomiting and abdominal pain.  Genitourinary: Positive for dysuria.       Objective:   Physical Exam  Constitutional: He appears well-developed and well-nourished.  Cardiovascular: Normal rate and regular rhythm.   Pulmonary/Chest: Effort normal and breath sounds normal. No respiratory distress. He has no wheezes. He has no rales.          Assessment & Plan:  Recurrent dysuria. By recent CT scan probable bladder stones. By history, these likely passed earlier in the week. Urine culture sent. Start back Cipro 500 mg twice a day pending results. Followup with urologist if symptoms persist.

## 2011-12-18 LAB — URINE CULTURE: Colony Count: >=100000

## 2011-12-18 MED ORDER — CEPHALEXIN 500 MG PO CAPS: 500.0000 mg | ORAL_CAPSULE | Freq: Three times a day (TID) | ORAL | Status: AC

## 2011-12-18 NOTE — Addendum Note (Signed)
Addended by: Azucena Freed on: 12/18/2011 04:51 PM   Modules accepted: Orders

## 2011-12-19 NOTE — Progress Notes (Signed)
Quick Note:  Pt informed ______ 

## 2011-12-27 DIAGNOSIS — J449 Chronic obstructive pulmonary disease, unspecified: Secondary | ICD-10-CM | POA: Insufficient documentation

## 2011-12-27 DIAGNOSIS — N200 Calculus of kidney: Secondary | ICD-10-CM | POA: Insufficient documentation

## 2011-12-27 DIAGNOSIS — I10 Essential (primary) hypertension: Secondary | ICD-10-CM | POA: Insufficient documentation

## 2012-01-16 ENCOUNTER — Encounter (INDEPENDENT_AMBULATORY_CARE_PROVIDER_SITE_OTHER): Payer: Medicare PPO | Admitting: Ophthalmology

## 2012-01-16 DIAGNOSIS — I10 Essential (primary) hypertension: Secondary | ICD-10-CM

## 2012-01-16 DIAGNOSIS — H43819 Vitreous degeneration, unspecified eye: Secondary | ICD-10-CM

## 2012-01-16 DIAGNOSIS — H3530 Unspecified macular degeneration: Secondary | ICD-10-CM

## 2012-01-16 DIAGNOSIS — H353290 Exudative age-related macular degeneration, unspecified eye, stage unspecified: Secondary | ICD-10-CM

## 2012-01-16 DIAGNOSIS — H35039 Hypertensive retinopathy, unspecified eye: Secondary | ICD-10-CM

## 2012-02-17 ENCOUNTER — Other Ambulatory Visit: Payer: Self-pay | Admitting: Internal Medicine

## 2012-02-25 DIAGNOSIS — G43909 Migraine, unspecified, not intractable, without status migrainosus: Secondary | ICD-10-CM | POA: Insufficient documentation

## 2012-02-27 ENCOUNTER — Encounter (INDEPENDENT_AMBULATORY_CARE_PROVIDER_SITE_OTHER): Payer: Medicare PPO | Admitting: Ophthalmology

## 2012-03-05 ENCOUNTER — Encounter (INDEPENDENT_AMBULATORY_CARE_PROVIDER_SITE_OTHER): Payer: Medicare PPO | Admitting: Ophthalmology

## 2012-03-05 DIAGNOSIS — H353290 Exudative age-related macular degeneration, unspecified eye, stage unspecified: Secondary | ICD-10-CM

## 2012-03-05 DIAGNOSIS — I10 Essential (primary) hypertension: Secondary | ICD-10-CM

## 2012-03-05 DIAGNOSIS — H43819 Vitreous degeneration, unspecified eye: Secondary | ICD-10-CM

## 2012-03-05 DIAGNOSIS — H35039 Hypertensive retinopathy, unspecified eye: Secondary | ICD-10-CM

## 2012-03-05 DIAGNOSIS — H3530 Unspecified macular degeneration: Secondary | ICD-10-CM

## 2012-04-14 ENCOUNTER — Encounter: Payer: Self-pay | Admitting: Cardiology

## 2012-04-14 ENCOUNTER — Ambulatory Visit (INDEPENDENT_AMBULATORY_CARE_PROVIDER_SITE_OTHER): Payer: Medicare PPO | Admitting: Cardiology

## 2012-04-14 ENCOUNTER — Telehealth: Payer: Self-pay | Admitting: Cardiology

## 2012-04-14 VITALS — BP 142/77 | HR 86 | Ht 73.0 in | Wt 206.1 lb

## 2012-04-14 DIAGNOSIS — R0989 Other specified symptoms and signs involving the circulatory and respiratory systems: Secondary | ICD-10-CM

## 2012-04-14 DIAGNOSIS — R9439 Abnormal result of other cardiovascular function study: Secondary | ICD-10-CM

## 2012-04-14 DIAGNOSIS — I10 Essential (primary) hypertension: Secondary | ICD-10-CM

## 2012-04-14 DIAGNOSIS — I447 Left bundle-branch block, unspecified: Secondary | ICD-10-CM

## 2012-04-14 DIAGNOSIS — I351 Nonrheumatic aortic (valve) insufficiency: Secondary | ICD-10-CM

## 2012-04-14 DIAGNOSIS — I2510 Atherosclerotic heart disease of native coronary artery without angina pectoris: Secondary | ICD-10-CM

## 2012-04-14 DIAGNOSIS — R072 Precordial pain: Secondary | ICD-10-CM | POA: Insufficient documentation

## 2012-04-14 DIAGNOSIS — I359 Nonrheumatic aortic valve disorder, unspecified: Secondary | ICD-10-CM

## 2012-04-14 MED ORDER — NITROGLYCERIN 0.4 MG SL SUBL: 0.4000 mg | SUBLINGUAL_TABLET | SUBLINGUAL | Status: DC | PRN

## 2012-04-14 NOTE — Telephone Encounter (Signed)
Discussed above with Dr.Jordan (DOD) and he advised that he would like to see the patient in the office today for an evaluation. Appointment set for 245 pm today. Patient aware of appointment.

## 2012-04-14 NOTE — Telephone Encounter (Signed)
Pt is having chest pain and not sure what is going on but wants to be seen next avail is jan 6th with Scott and Dr. Daleen Squibb not til March and pt doesn't want to wait that long

## 2012-04-14 NOTE — Patient Instructions (Signed)
Continue your current medication  We refilled your Ntg.  We will schedule you for a nuclear stress test and an echocardiogram

## 2012-04-14 NOTE — Telephone Encounter (Signed)
Called patient back. He states that he is having midsternal chest pain on and off since yesterday. Not sure if it is cardiac pain or acid reflux pain. Denies any increased SOB or bad taste in his mouth. Did not try NTG because he ran out. Wanted an appointment with Dr.Wall.  Will discuss with DOD and call back.

## 2012-04-15 NOTE — Progress Notes (Signed)
Freda Munro Date of Birth: 1931-03-06 Medical Record #[id number]  History of Present Illness: Mr. Koeppen is seen as a work in visit today for evaluation of chest pain. He is a 78 year old white male with history of coronary disease and aortic insufficiency. He states that over the past couple of months he has had intermittent chest pain. Yesterday his pain became more severe. He states he cannot distinguish between his heart pain and pain that he experiences with acid reflux. He describes the pain as a mid lower sternal pain that does radiate a little bit to the right precordium. He states it feels like a big bubble in his chest. His symptoms are worse when he is sitting down and actually improve with walking around. Yesterday he took a drink of soda water with improvement. He does have a history of chronic dyspnea which is bad all the time. He does have significant COPD and some early pulmonary fibrosis. His symptoms of dyspnea really have not changed.  His coronary disease history includes an inferior posterior myocardial infarction in May of 2000. He had stenting of the distal circumflex. Repeat cardiac catheterization in 2001 demonstrated that the stent was occluded and the vessel filled by right to left collaterals. He has been managed medically since then. His ejection fraction has been moderately reduced. The best I can tell his last ischemic evaluation was in 2009 where Myoview showed inferior lateral and inferior septal fixed defects with an ejection fraction of 40%. He had an echocardiogram in early 2012 which showed an ejection fraction of 40% with mild left ventricular enlargement. He had moderate to severe aortic insufficiency at that time.  He had a history of ruptured abdominal aortic aneurysm in 1999. This was treated with emergency surgery. 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 This has been evaluated by vascular surgery here as well as by Dr. Pattricia Boss in [facility].  Conservative followup has been recommended. Current Outpatient Prescriptions on File Prior to Visit  Medication Sig Dispense Refill  . aspirin 81 MG tablet Take 81 mg by mouth daily.        Marland Kitchen doxazosin (CARDURA) 8 MG tablet Take 1 tablet (8 mg total) by mouth daily.  90 tablet  3  . finasteride (PROSCAR) 5 MG tablet Take 1 tablet (5 mg total) by mouth daily.  90 tablet  3  . HYDROcodone-acetaminophen (NORCO) 5-325 MG per tablet Take 1 tablet by mouth daily as needed for pain.  20 tablet  0  . ibuprofen (ADVIL,MOTRIN) 800 MG tablet Take 800 mg by mouth every 8 (eight) hours as needed. For pain      . ibuprofen (ADVIL,MOTRIN) 800 MG tablet TAKE 1 TABLET TWICE A DAY WITH FOOD  60 tablet  2  . lisinopril (PRINIVIL,ZESTRIL) 20 MG tablet Take 1 tablet (20 mg total) by mouth daily.  90 tablet  3  . LORazepam (ATIVAN) 1 MG tablet TAKE  1  TABLET  DAILY  AS  NEEDED  90 tablet  PRN  . omeprazole (PRILOSEC) 20 MG capsule Take 1 capsule (20 mg total) by mouth 2 (two) times daily.  180 capsule  3  . polyethylene glycol powder (GLYCOLAX/MIRALAX) powder MIX 17 GRAMS IN LIQUID ONCE DAILY  255 g  2  . senna (SENOKOT) 8.6 MG tablet Take 1 tablet by mouth 3 (three) times daily as needed. Hold for loose stools      . tiotropium (SPIRIVA) 18 MCG inhalation capsule Place 1 capsule (  18 mcg total) into inhaler and inhale daily.  90 capsule  3  . zolpidem (AMBIEN) 5 MG tablet Take 1 tablet (5 mg total) by mouth at bedtime as needed.  30 tablet  1  . nitroGLYCERIN (NITROSTAT) 0.4 MG SL tablet Place 1 tablet (0.4 mg total) under the tongue every 5 (five) minutes as needed for chest pain.  90 tablet  3  . topiramate (TOPAMAX) 200 MG tablet Take 150 mg by mouth at bedtime.         Allergies  Allergen Reactions  . Beta Adrenergic Blockers     Past Medical History  Diagnosis Date  . Kidney tumor (benign)   . Chronic headache   . Hypertension   . Nephrolithiasis   . Renal insufficiency   . Peripheral vascular  disease   . BPH (benign prostatic hypertrophy)   . CAD (coronary artery disease)   . Detached retina   . Asthma   . Allergic rhinitis   . Sleep apnea   . Pulmonary fibrosis Jan 2009    very subtle possible fibrosis noted on CT Jan 2009.  Unchanged since Oct 2006. Deemed possibly due to chlorine exposure at gym work swimming pool  . COPD (chronic obstructive pulmonary disease)     2001 PFTs  - FEv1 2.5L/66%, DLCO 57% -> March 2009: Fev1 1.91L/625, Ratio 50, DLCO 50%  . AAA (abdominal aortic aneurysm)     Rupture 16 years ago  . History of colonic diverticulitis   . Aneurysm, common iliac artery   . Myocardial infarction     minor in 2000, 2001  . Aortic insufficiency     Past Surgical History  Procedure Date  . Nephrectomy 2003     left partial, benign tumor  . Vasectomy   . Ptca 2001 and 2002  . Abdominal aortic aneurysm repair 1997  . Cataract extraction     bilateral  . Cardiac catheterization 07/22/02    EF 55%  . Abdominal aortic aneurysm repair   . Eye surgery     History  Smoking status  . Former Smoker -- 1.0 packs/day for 50 years  . Types: Cigarettes  . Quit date: 04/23/1997  Smokeless tobacco  . Never Used    History  Alcohol Use No    Family History  Problem Relation Age of Onset  . Aneurysm Father 10    deceased  . Other Father     AAA  . Hypertension Mother   . Other Mother     amputation (leg)    Review of Systems: The review of systems is positive for chronic dyspnea. He denies any abdominal or back pain. He has had no increase in edema. He does have a history of acid reflux disease.  All other systems were reviewed and are negative.  Physical Exam: BP 142/77  Pulse 86  Ht 6\' 1"  (1.854 m)  Wt 206 lb 1.9 oz (93.495 kg)  BMI 27.19 kg/m2 He is an elderly white male in no acute distress. HEENT: Normocephalic, atraumatic. Pupils equal round and reactive to light accommodation. Sclera are clear. Oropharynx is clear with good dentition. Neck  is supple no JVD, adenopathy, thyromegaly, or bruits. Lungs: Clear  Cardiovascular: Regular rate and rhythm normal S1 and S2 with a grade 2/6 systolic ejection murmur the right upper sternal border. There is an early diastolic murmur grade 1-2/6. Abdomen: Obese, soft, nontender. Well healed midline surgical scar. No masses or bruits. Extremities: Normal femoral and pedal pulses. No cyanosis  or edema. Skin: Warm and dry. Neuro: Alert and oriented x3. Cranial nerves II through XII are intact. No focal findings. LABORATORY DATA: ECG demonstrates normal sinus rhythm with first degree AV block and occasional PVCs. There is left axis deviation. He has a left bundle branch block pattern.  Assessment / Plan: 1. Chest pain with atypical features. This may be related to acid reflux disease given that it occurs at rest and is relieved with soda water. It has been several years since he has had an ischemic evaluation. We will schedule him for a lexiscan Myoview study. Chronic aortic insufficiency can also lead to chest pain so we'll also update his echocardiogram. If these studies are stable then I would recommend continued medical management.  2. Chronic aortic insufficiency moderate to severe  3. Coronary disease with remote inferior posterior myocardial infarction. Chronic occlusion of the distal left circumflex.  4. COPD with mild pulmonary fibrosis.  5. Hypertension.  6. Abdominal aortic aneurysm status post repair. 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 Location argues for conservative management.

## 2012-04-17 ENCOUNTER — Ambulatory Visit (HOSPITAL_COMMUNITY): Payer: Medicare PPO | Attending: Cardiology | Admitting: Radiology

## 2012-04-17 VITALS — BP 137/64 | Ht 73.0 in | Wt 202.0 lb

## 2012-04-17 DIAGNOSIS — I10 Essential (primary) hypertension: Secondary | ICD-10-CM | POA: Insufficient documentation

## 2012-04-17 DIAGNOSIS — R0602 Shortness of breath: Secondary | ICD-10-CM

## 2012-04-17 DIAGNOSIS — R0989 Other specified symptoms and signs involving the circulatory and respiratory systems: Secondary | ICD-10-CM | POA: Insufficient documentation

## 2012-04-17 DIAGNOSIS — R079 Chest pain, unspecified: Secondary | ICD-10-CM | POA: Insufficient documentation

## 2012-04-17 DIAGNOSIS — I447 Left bundle-branch block, unspecified: Secondary | ICD-10-CM | POA: Insufficient documentation

## 2012-04-17 DIAGNOSIS — I351 Nonrheumatic aortic (valve) insufficiency: Secondary | ICD-10-CM

## 2012-04-17 DIAGNOSIS — I2510 Atherosclerotic heart disease of native coronary artery without angina pectoris: Secondary | ICD-10-CM

## 2012-04-17 DIAGNOSIS — I739 Peripheral vascular disease, unspecified: Secondary | ICD-10-CM | POA: Insufficient documentation

## 2012-04-17 DIAGNOSIS — R0609 Other forms of dyspnea: Secondary | ICD-10-CM | POA: Insufficient documentation

## 2012-04-17 DIAGNOSIS — R072 Precordial pain: Secondary | ICD-10-CM

## 2012-04-17 MED ORDER — TECHNETIUM TC 99M SESTAMIBI GENERIC - CARDIOLITE
10.8000 | Freq: Once | INTRAVENOUS | Status: AC | PRN
  Administered 2012-04-17: 11 via INTRAVENOUS

## 2012-04-17 MED ORDER — ADENOSINE (DIAGNOSTIC) 3 MG/ML IV SOLN
0.5600 mg/kg | Freq: Once | INTRAVENOUS | Status: AC
  Administered 2012-04-17: 51.3 mg via INTRAVENOUS

## 2012-04-17 MED ORDER — TECHNETIUM TC 99M SESTAMIBI GENERIC - CARDIOLITE
33.0000 | Freq: Once | INTRAVENOUS | Status: AC | PRN
  Administered 2012-04-17: 33 via INTRAVENOUS

## 2012-04-17 NOTE — Progress Notes (Signed)
[facility] SITE 3 NUCLEAR MED [address] [place], [place] [address] [phone]    Cardiology Nuclear Med Study  [name] is a 77 y.o. male     MRN : [id number]     DOB: [birth year]  Procedure Date: 04/17/2012  Nuclear Med Background Indication for Stress Test:  Evaluation for Ischemia, Stent Patency and PTCA Patency History:  2012 Echo EF 35-40%,04 Heart catheterization-EF 55%, 03 MPS negative, 5/01 MI, PTCA,Stent-Cfx Cardiac Risk Factors: History of Smoking, Hypertension, LBBB, Lipids and PVD  Symptoms:  Chest Pain, DOE and SOB   Nuclear Pre-Procedure Caffeine/Decaff Intake:  None NPO After: 6 pm   Lungs:  clear O2 Sat: 96% on room air. IV 0.9% NS with Angio Cath:  22g  IV Site: R Wrist  IV Started by:  Bonnita Levan, RN  Chest Size (in):  46 Cup Size: n/a  Height: 6\' 1"  (1.854 m)  Weight:  202 lb (91.627 kg)  BMI:  Body mass index is 26.65 kg/(m^2). Tech Comments:  N/A    Nuclear Med Study 1 or 2 day study: 1 day  Stress Test Type:  Adenosine  Reading MD: Charlton Haws, MD  Order Authorizing Provider:  Wilhemenia Camba [place], MD  Resting Radionuclide: Technetium 28m Sestamibi  Resting Radionuclide Dose: 11.0 mCi   Stress Radionuclide:  Technetium 50m Sestamibi  Stress Radionuclide Dose: 33.0 mCi           Stress Protocol Rest HR: 68 Stress HR: 73  Rest BP: 137/64 Stress BP: 138/66  Exercise Time (min): n/a METS: n/a   Predicted Max HR: 141 bpm % Max HR: 53.9 bpm Rate Pressure Product: 21308    Dose of Adenosine (mg):  51.4 Dose of Lexiscan: n/a mg  Dose of Atropine (mg): n/a Dose of Dobutamine: n/a mcg/kg/min (at max HR)  Stress Test Technologist: Bonnita Levan, RN  Nuclear Technologist:  Domenic Polite, CNMT     Rest Procedure:  Myocardial perfusion imaging was performed at rest 45 minutes following the intravenous administration of Technetium 43m Sestamibi. Rest ECG: NSR-LBBB  Stress Procedure:  The patient received IV adenosine at 140 mcg/kg/min for  4-minutes with concurrent submaximal exercise (1.57mph, 0% grade).  Technetium 15m Sestamibi was injected at the 2-minute mark and quantitative spect images were obtained. Stress ECG: No significant change from baseline ECG  QPS Raw Data Images:  Normal; no motion artifact; normal heart/lung ratio. Stress Images:  There is decreased uptake in the inferior wall. Rest Images:  There is decreased uptake in the inferior wall. Subtraction (SDS):  There is a fixed defect that is most consistent with a previous infarction. Transient Ischemic Dilatation (Normal <1.22):  1.02 Lung/Heart Ratio (Normal <0.45):  0.28  Quantitative Gated Spect Images QGS EDV:  234 ml QGS ESV:  152 ml  Impression Exercise Capacity:  Adenosine study with no exercise. BP Response:  Normal blood pressure response. Clinical Symptoms:  There is dyspnea. ECG Impression:  No significant ST segment change suggestive of ischemia. Comparison with Prior Nuclear Study: No images to compare  Overall Impression:  Intermediate stress nuclear study.  Large inferior wall infarct from apex to base  LV Ejection Fraction: 35%.  LV Wall Motion:  Septal and inferior wall hypokinesis    Charlton Haws

## 2012-04-21 ENCOUNTER — Ambulatory Visit (HOSPITAL_COMMUNITY): Payer: Medicare PPO | Attending: Cardiology | Admitting: Radiology

## 2012-04-21 DIAGNOSIS — I2510 Atherosclerotic heart disease of native coronary artery without angina pectoris: Secondary | ICD-10-CM | POA: Insufficient documentation

## 2012-04-21 DIAGNOSIS — R0989 Other specified symptoms and signs involving the circulatory and respiratory systems: Secondary | ICD-10-CM

## 2012-04-21 DIAGNOSIS — J449 Chronic obstructive pulmonary disease, unspecified: Secondary | ICD-10-CM | POA: Insufficient documentation

## 2012-04-21 DIAGNOSIS — I447 Left bundle-branch block, unspecified: Secondary | ICD-10-CM

## 2012-04-21 DIAGNOSIS — I359 Nonrheumatic aortic valve disorder, unspecified: Secondary | ICD-10-CM

## 2012-04-21 DIAGNOSIS — I369 Nonrheumatic tricuspid valve disorder, unspecified: Secondary | ICD-10-CM | POA: Insufficient documentation

## 2012-04-21 DIAGNOSIS — I10 Essential (primary) hypertension: Secondary | ICD-10-CM | POA: Insufficient documentation

## 2012-04-21 DIAGNOSIS — I351 Nonrheumatic aortic (valve) insufficiency: Secondary | ICD-10-CM

## 2012-04-21 DIAGNOSIS — I080 Rheumatic disorders of both mitral and aortic valves: Secondary | ICD-10-CM | POA: Insufficient documentation

## 2012-04-21 DIAGNOSIS — R011 Cardiac murmur, unspecified: Secondary | ICD-10-CM | POA: Insufficient documentation

## 2012-04-21 DIAGNOSIS — R072 Precordial pain: Secondary | ICD-10-CM

## 2012-04-21 DIAGNOSIS — J4489 Other specified chronic obstructive pulmonary disease: Secondary | ICD-10-CM | POA: Insufficient documentation

## 2012-04-21 NOTE — Progress Notes (Signed)
Echocardiogram performed.  

## 2012-04-24 ENCOUNTER — Other Ambulatory Visit: Payer: Self-pay

## 2012-04-24 MED ORDER — LISINOPRIL 40 MG PO TABS: 40.0000 mg | ORAL_TABLET | Freq: Every day | ORAL | Status: DC

## 2012-04-25 ENCOUNTER — Encounter (INDEPENDENT_AMBULATORY_CARE_PROVIDER_SITE_OTHER): Payer: Medicare PPO | Admitting: Ophthalmology

## 2012-04-25 DIAGNOSIS — I714 Abdominal aortic aneurysm, without rupture, unspecified: Secondary | ICD-10-CM | POA: Insufficient documentation

## 2012-05-06 DIAGNOSIS — H3530 Unspecified macular degeneration: Secondary | ICD-10-CM | POA: Insufficient documentation

## 2012-05-08 ENCOUNTER — Telehealth: Payer: Self-pay | Admitting: Cardiology

## 2012-05-08 NOTE — Telephone Encounter (Signed)
PT CALLING RE VIBRATING IN LEFT SIDE OF CHEST, STARTED ABOUT AN HOUR AGO, TEN MIN APART, LASTING ABOUT 3 SECONDS, WHAT COULD IT BE, IS THIS NORMAL?

## 2012-05-08 NOTE — Telephone Encounter (Signed)
Spoke to patient he stated he has a "vibration" in his left chest near his lf nipple.States vibration comes and goes last appox 3 secs.States feels like he has his cell phone in his pocket, but he does not.States no chest pain or sob.Patient was told Dr.Jordan not in office will check with DOD Dr.Wall and call him back.

## 2012-05-08 NOTE — Telephone Encounter (Signed)
Spoke to DOD Dr.Wall he advised he didn't think the vibration was anything to be concerned with.Advised to call back if needed.

## 2012-05-12 ENCOUNTER — Encounter: Payer: Self-pay | Admitting: Gastroenterology

## 2012-05-26 ENCOUNTER — Encounter: Payer: Self-pay | Admitting: Cardiology

## 2012-05-26 ENCOUNTER — Ambulatory Visit (INDEPENDENT_AMBULATORY_CARE_PROVIDER_SITE_OTHER): Payer: Medicare PPO | Admitting: Cardiology

## 2012-05-26 VITALS — BP 132/78 | HR 74 | Ht 73.0 in | Wt 208.6 lb

## 2012-05-26 DIAGNOSIS — I10 Essential (primary) hypertension: Secondary | ICD-10-CM

## 2012-05-26 DIAGNOSIS — I351 Nonrheumatic aortic (valve) insufficiency: Secondary | ICD-10-CM

## 2012-05-26 DIAGNOSIS — I2510 Atherosclerotic heart disease of native coronary artery without angina pectoris: Secondary | ICD-10-CM

## 2012-05-26 DIAGNOSIS — I714 Abdominal aortic aneurysm, without rupture, unspecified: Secondary | ICD-10-CM

## 2012-05-26 DIAGNOSIS — I359 Nonrheumatic aortic valve disorder, unspecified: Secondary | ICD-10-CM

## 2012-05-26 DIAGNOSIS — I5022 Chronic systolic (congestive) heart failure: Secondary | ICD-10-CM

## 2012-05-26 DIAGNOSIS — I509 Heart failure, unspecified: Secondary | ICD-10-CM

## 2012-05-26 MED ORDER — LISINOPRIL 40 MG PO TABS: 40.0000 mg | ORAL_TABLET | Freq: Every day | ORAL | Status: DC

## 2012-05-26 NOTE — Progress Notes (Signed)
Freda Munro Date of Birth: [birth year] Medical Record #[id number]  History of Present Illness: Mr. Bautch is seen as a follow up visit.  His coronary disease history includes an inferior posterior myocardial infarction in May of 2000. He had stenting of the distal circumflex. Repeat cardiac catheterization in 2001 demonstrated that the stent was occluded and the vessel filled by right to left collaterals.   He had a history of ruptured abdominal aortic aneurysm in 1999. This was treated with emergency surgery. 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 This has been evaluated by vascular surgery here as well as by Dr. Pattricia Boss in [facility]. Conservative followup has been recommended.  On follow up today he denies any recurrent chest pain. He still has SOB and has been out of his Spiriva for the past week. States his breathing is always bad.  Current Outpatient Prescriptions on File Prior to Visit  Medication Sig Dispense Refill  . aspirin 81 MG tablet Take 81 mg by mouth daily.        Marland Kitchen doxazosin (CARDURA) 8 MG tablet Take 1 tablet (8 mg total) by mouth daily.  90 tablet  3  . finasteride (PROSCAR) 5 MG tablet Take 1 tablet (5 mg total) by mouth daily.  90 tablet  3  . HYDROcodone-acetaminophen (NORCO) 5-325 MG per tablet Take 1 tablet by mouth daily as needed for pain.  20 tablet  0  . ibuprofen (ADVIL,MOTRIN) 800 MG tablet Take 800 mg by mouth every 8 (eight) hours as needed. For pain      . ibuprofen (ADVIL,MOTRIN) 800 MG tablet TAKE 1 TABLET TWICE A DAY WITH FOOD  60 tablet  2  . lisinopril (PRINIVIL,ZESTRIL) 40 MG tablet Take 1 tablet (40 mg total) by mouth daily.  90 tablet  3  . LORazepam (ATIVAN) 1 MG tablet TAKE  1  TABLET  DAILY  AS  NEEDED  90 tablet  PRN  . nitroGLYCERIN (NITROSTAT) 0.4 MG SL tablet Place 1 tablet (0.4 mg total) under the tongue every 5 (five) minutes as needed for chest pain.  90 tablet  3  . omeprazole (PRILOSEC) 20 MG capsule Take 1 capsule (20  mg total) by mouth 2 (two) times daily.  180 capsule  3  . polyethylene glycol powder (GLYCOLAX/MIRALAX) powder MIX 17 GRAMS IN LIQUID ONCE DAILY  255 g  2  . senna (SENOKOT) 8.6 MG tablet Take 1 tablet by mouth 3 (three) times daily as needed. Hold for loose stools      . tiotropium (SPIRIVA) 18 MCG inhalation capsule Place 1 capsule (18 mcg total) into inhaler and inhale daily.  90 capsule  3  . topiramate (TOPAMAX) 200 MG tablet Take 150 mg by mouth at bedtime.       Marland Kitchen zolpidem (AMBIEN) 5 MG tablet Take 1 tablet (5 mg total) by mouth at bedtime as needed.  30 tablet  1    Allergies  Allergen Reactions  . Beta Adrenergic Blockers     Past Medical History  Diagnosis Date  . Kidney tumor (benign)   . Chronic headache   . Hypertension   . Nephrolithiasis   . Renal insufficiency   . Peripheral vascular disease   . BPH (benign prostatic hypertrophy)   . CAD (coronary artery disease)   . Detached retina   . Asthma   . Allergic rhinitis   . Sleep apnea   . Pulmonary fibrosis Jan 2009    very subtle  possible fibrosis noted on CT Jan 2009.  Unchanged since Oct 2006. Deemed possibly due to chlorine exposure at gym work swimming pool  . COPD (chronic obstructive pulmonary disease)     2001 PFTs  - FEv1 2.5L/66%, DLCO 57% -> March 2009: Fev1 1.91L/625, Ratio 50, DLCO 50%  . AAA (abdominal aortic aneurysm)     Rupture 16 years ago  . History of colonic diverticulitis   . Aneurysm, common iliac artery   . Myocardial infarction     minor in 2000, 2001  . Aortic insufficiency   . Chronic systolic CHF (congestive heart failure)     Past Surgical History  Procedure Date  . Nephrectomy 2003     left partial, benign tumor  . Vasectomy   . Ptca 2001 and 2002  . Abdominal aortic aneurysm repair 1997  . Cataract extraction     bilateral  . Cardiac catheterization 07/22/02    EF 55%  . Abdominal aortic aneurysm repair   . Eye surgery     History  Smoking status  . Former Smoker --  1.0 packs/day for 50 years  . Types: Cigarettes  . Quit date: 04/23/1997  Smokeless tobacco  . Never Used    History  Alcohol Use No    Family History  Problem Relation Age of Onset  . Aneurysm Father 64    deceased  . Other Father     AAA  . Hypertension Mother   . Other Mother     amputation (leg)    Review of Systems: The review of systems is positive for chronic dyspnea. He denies any abdominal or back pain. He has had no increase in edema.  All other systems were reviewed and are negative.  Physical Exam: BP 132/78  Pulse 74  Ht 6\' 1"  (1.854 m)  Wt 208 lb 9.6 oz (94.62 kg)  BMI 27.52 kg/m2 He is an elderly white male in no acute distress. HEENT: Normocephalic, atraumatic. Pupils equal round and reactive to light accommodation. Sclera are clear. Oropharynx is clear with good dentition. Neck is supple no JVD, adenopathy, thyromegaly, or bruits. Lungs: Clear  Cardiovascular: Regular rate and rhythm normal S1 and S2 with a grade 2/6 systolic ejection murmur the right upper sternal border. There is an early diastolic murmur grade 1-2/6. Abdomen: Obese, soft, nontender. Well healed midline surgical scar. No masses or bruits. Extremities: Normal femoral and pedal pulses. No cyanosis or edema. Skin: Warm and dry. Neuro: Alert and oriented x3. Cranial nerves II through XII are intact. No focal findings. LABORATORY DATA:  Transthoracic Echocardiography  Patient: Deyton, Ellenbecker MR #: [id number] Study Date: 04/21/2012 Gender: M Age: 79 Height: 185.4cm Weight: 91.6kg BSA: 2.33m^2 Pt. Status: Room:  ORDERING [place], Jari Dipasquale REFERRING [place], Tayia Stonesifer ATTENDING Default, Provider W PERFORMING [facility], [facility] SONOGRAPHER Junious Dresser, RDCS cc:  ------------------------------------------------------------ LV EF: 25% - 30%  ------------------------------------------------------------ Indications: Aortic insufficiency 424.1. Murmur  785.2.  ------------------------------------------------------------ History: PMH: PVD. History of ruptured aortic aneurysm 1995. Acquired from the patient and from the patient's chart. Chest pain. 2/6 Systolic murmur at right upper sternal border and 1-2/6 diastolic murmur. Left bundle branch block. Coronary artery disease. Coronary artery disease. Borderline significant aortic regurgitation. Significant Chronic obstructive pulmonary disease. PMH: Myocardial infarction. Risk factors: Hypertension.  ------------------------------------------------------------ Study Conclusions  - Left ventricle: The cavity size was moderately dilated. There was mild focal basal hypertrophy of the septum. Systolic function was severely reduced. The estimated ejection fraction was in the range of 25%  to 30%. Diffuse hypokinesis. There is akinesis of the inferior myocardium. Doppler parameters are consistent with abnormal left ventricular relaxation (grade 1 diastolic dysfunction). - Aortic valve: Moderate regurgitation. - Aortic root: The aortic root was moderately dilated. - Ascending aorta: The ascending aorta was moderately dilated. - Mitral valve: Mild regurgitation. - Left atrium: The atrium was mildly dilated. - Right atrium: The atrium was mildly dilated. - Pulmonary arteries: Systolic pressure was mildly increased. PA peak pressure: 32mm Hg (S). Impressions:  - Ascending aorta appears to be moderately dilated; suggest CTA or MRA to better assess.  ------------------------------------------------------------ Labs, prior tests, procedures, and surgery: Echocardiography (2012). The aortic valve showed moderate to severe regurgitation. EF was 40%. Large inferior wall infarct from base to apex. Septal and inferior wall hypokinesis.  Stress nuclear imaging (December 2013). EF was 35%. Catheterization. The study demonstrated coronary artery disease. There was a stenosis in the left  circumflex coronary artery which was treated with a stent. Transthoracic echocardiography. M-mode, complete 2D, spectral Doppler, and color Doppler. Height: Height: 185.4cm. Height: 73in. Weight: Weight: 91.6kg. Weight: 201.6lb. Body mass index: BMI: 26.7kg/m^2. Body surface area: BSA: 2.45m^2. Blood pressure: 120/75. Patient status: Outpatient. Location: [place] [facility]  ------------------------------------------------------------  ------------------------------------------------------------ Left ventricle: The cavity size was moderately dilated. There was mild focal basal hypertrophy of the septum. Systolic function was severely reduced. The estimated ejection fraction was in the range of 25% to 30%. Diffuse hypokinesis. Regional wall motion abnormalities: There is akinesis of the inferior myocardium. Doppler parameters are consistent with abnormal left ventricular relaxation (grade 1 diastolic dysfunction).  ------------------------------------------------------------ Aortic valve: Trileaflet; mildly thickened leaflets. Mobility was not restricted. Doppler: Transvalvular velocity was within the normal range. There was no stenosis. Moderate regurgitation.  ------------------------------------------------------------ Aorta: Aortic root: The aortic root was moderately dilated. Ascending aorta: The ascending aorta was moderately dilated.  ------------------------------------------------------------ Mitral valve: Structurally normal valve. Mobility was not restricted. Doppler: Transvalvular velocity was within the normal range. There was no evidence for stenosis. Mild regurgitation.  ------------------------------------------------------------ Left atrium: The atrium was mildly dilated.  ------------------------------------------------------------ Right ventricle: The cavity size was normal. Systolic function was  normal.  ------------------------------------------------------------ Pulmonic valve: Doppler: Transvalvular velocity was within the normal range. There was no evidence for stenosis.  ------------------------------------------------------------ Tricuspid valve: Structurally normal valve. Doppler: Transvalvular velocity was within the normal range. Mild regurgitation.  ------------------------------------------------------------ Pulmonary artery: Systolic pressure was mildly increased.  ------------------------------------------------------------ Right atrium: The atrium was mildly dilated.  ------------------------------------------------------------ Pericardium: There was no pericardial effusion.  ------------------------------------------------------------ Systemic veins: Inferior vena cava: The vessel was normal in size.  ------------------------------------------------------------  2D measurements Normal Doppler measurements Normal Left ventricle Main pulmonary LVID ED, 68.4 mm 43-52 artery chord, Pressure, 32 mm Hg =30 PLAX S LVID ES, 55.9 mm 23-38 Left ventricle chord, Ea, lat 4.17 cm/s ------ PLAX ann, tiss FS, chord, 18 % >29 DP PLAX E/Ea, lat 13.26 ------ LVPW, ED 9.74 mm ------ ann, tiss IVS/LVPW 1.6 <1.3 DP ratio, ED Ea, med 5.48 cm/s ------ Vol ED, 264 ml ------ ann, tiss MOD1 DP Vol index, 122 ml/m^2 ------ E/Ea, med 10.09 ------ ED, MOD1 ann, tiss Vol ED, 242 ml ------ DP MOD2 LVOT Vol index, 112 ml/m^2 ------ Peak vel, 114 cm/s ------ ED, MOD2 S Ventricular septum VTI, S 24.2 cm ------ IVS, ED 15.6 mm ------ Peak 5 mm Hg ------ LVOT gradient, Diam, S 31 mm ------ S Area 7.55 cm^2 ------ Stroke vol 182.7 ml ------ Diam 31 mm ------ Stroke 84.6 ml/m^ ------ Aorta index 2  Root diam, 48 mm ------ Aortic valve ED Regurg PHT 468 ms ------ AAo AP 46 mm ------ Mitral valve diam, S Peak E vel 55.3 cm/s ------ Left atrium Peak A vel 122 cm/s ------ AP  dim 42 mm ------ Decelerati 141 ms 150-23 AP dim 1.94 cm/m^2 <2.2 on time 0 index Peak E/A 0.5 ------ ratio Tricuspid valve Regurg 261 cm/s ------ peak vel Peak RV-RA 27 mm Hg ------ gradient, S Systemic veins Estimated 5 mm Hg ------ CVP Right ventricle Pressure, 32 mm Hg <30 S Sa vel, 13 cm/s ------ lat ann, tiss DP  ------------------------------------------------------------ Prepared and Electronically Authenticated by  Olga Millers 2013-12-30T17:27:[id number]      Cardiology Nuclear Med Study  FREDERICO GERLING is a 79 y.o. male MRN : [id number] DOB: Aug 28, 1930  Procedure Date: 04/17/2012  Nuclear Med Background  Indication for Stress Test: Evaluation for Ischemia, Stent Patency and PTCA Patency  History: 2012 Echo EF 35-40%,04 Heart catheterization-EF 55%, 03 MPS negative, 5/01 MI, PTCA,Stent-Cfx  Cardiac Risk Factors: History of Smoking, Hypertension, LBBB, Lipids and PVD  Symptoms: Chest Pain, DOE and SOB  Nuclear Pre-Procedure  Caffeine/Decaff Intake: None  NPO After: 6 pm   Lungs: clear  O2 Sat: 96% on room air.  IV 0.9% NS with Angio Cath: 22g   IV Site: R Wrist  IV Started by: Bonnita Levan, RN   Chest Size (in): 46  Cup Size: n/a   Height: 6\' 1"  (1.854 m)  Weight: 202 lb (91.627 kg)   BMI: Body mass index is 26.65 kg/(m^2).  Tech Comments: N/A   Nuclear Med Study  1 or 2 day study: 1 day  Stress Test Type: Adenosine   Reading MD: Charlton Haws, MD  Order Authorizing Provider: Adamariz Gillott [place], MD   Resting Radionuclide: Technetium 71m Sestamibi  Resting Radionuclide Dose: 11.0 mCi   Stress Radionuclide: Technetium 47m Sestamibi  Stress Radionuclide Dose: 33.0 mCi   Stress Protocol  Rest HR: 68  Stress HR: 73   Rest BP: 137/64  Stress BP: 138/66   Exercise Time (min): n/a  METS: n/a   Predicted Max HR: 141 bpm  % Max HR: 53.9 bpm  Rate Pressure Product: 40981  Dose of Adenosine (mg): 51.4  Dose of Lexiscan: n/a mg   Dose of Atropine (mg): n/a  Dose of Dobutamine:  n/a mcg/kg/min (at max HR)   Stress Test Technologist: Bonnita Levan, RN  Nuclear Technologist: Domenic Polite, CNMT   Rest Procedure: Myocardial perfusion imaging was performed at rest 45 minutes following the intravenous administration of Technetium 20m Sestamibi.  Rest ECG: NSR-LBBB  Stress Procedure: The patient received IV adenosine at 140 mcg/kg/min for 4-minutes with concurrent submaximal exercise (1.85mph, 0% grade). Technetium 23m Sestamibi was injected at the 2-minute mark and quantitative spect images were obtained.  Stress ECG: No significant change from baseline ECG  QPS  Raw Data Images: Normal; no motion artifact; normal heart/lung ratio.  Stress Images: There is decreased uptake in the inferior wall.  Rest Images: There is decreased uptake in the inferior wall.  Subtraction (SDS): There is a fixed defect that is most consistent with a previous infarction.  Transient Ischemic Dilatation (Normal <1.22): 1.02  Lung/Heart Ratio (Normal <0.45): 0.28  Quantitative Gated Spect Images  QGS EDV: 234 ml  QGS ESV: 152 ml  Impression  Exercise Capacity: Adenosine study with no exercise.  BP Response: Normal blood pressure response.  Clinical Symptoms: There is dyspnea.  ECG Impression: No significant ST segment change suggestive of  ischemia.  Comparison with Prior Nuclear Study: No images to compare  Overall Impression: Intermediate stress nuclear study. Large inferior wall infarct from apex to base  LV Ejection Fraction: 35%. LV Wall Motion: Septal and inferior wall hypokinesis  Charlton Haws     Assessment / Plan: 1. Chest pain with atypical features. Myoview study shows an old inferior infarct without ishchemia. AI is only moderate by Echo. Chest pain has now resolved. Will continue medical management.  2. Chronic aortic insufficiency moderate by recent Echo.  3. Coronary disease with remote inferior posterior myocardial infarction. Chronic occlusion of the distal left  circumflex.  4. COPD with mild pulmonary fibrosis.  5. Hypertension.  6. Abdominal aortic aneurysm status post repair. 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 Location argues for conservative management.  7. Chronic systolic CHF with ischemic cardiomyopathy. EF has dropped from 40% to 35% by myoview and 25-30% by Echo. Lisinopril dose recently optimized. Not a candidate for beta blocker due to advanced pulmonary disease. Does not appear to be volume overloaded. I will follow up in 3 months with a BMET and BNP. Consider adding aldactone if renal function is stable.

## 2012-05-26 NOTE — Patient Instructions (Signed)
Continue your current medication  I will see you again in 3 months

## 2012-05-27 ENCOUNTER — Other Ambulatory Visit: Payer: Self-pay | Admitting: Internal Medicine

## 2012-05-31 ENCOUNTER — Other Ambulatory Visit: Payer: Self-pay | Admitting: Internal Medicine

## 2012-06-02 ENCOUNTER — Telehealth: Payer: Self-pay | Admitting: Internal Medicine

## 2012-06-02 ENCOUNTER — Telehealth: Payer: Self-pay | Admitting: Cardiology

## 2012-06-02 NOTE — Telephone Encounter (Signed)
Pt calling re medication problem

## 2012-06-02 NOTE — Telephone Encounter (Signed)
Spoke with pt, he states dr [place] had told him he would write a script for him to get a spirvia inhaler. He needs this sent to [facility] for 3 month supply. Explained to pt will get the okay from dr [place] and will call back if there is a problem.

## 2012-06-02 NOTE — Telephone Encounter (Signed)
He should get Spiriva filled by Primary or pulmonary.  Peter [place] MD, [facility]

## 2012-06-02 NOTE — Telephone Encounter (Signed)
Spoke with pt, aware of dr jordan's recommendations. 

## 2012-06-02 NOTE — Telephone Encounter (Signed)
I spoke with the pt and advised that we need to get him schedule for an appt, last OV on 11-2010. Pt states that he cannot afford to come in for an OV at this time. I advised the pt of financial policy and that he does not need to pay anything up front. Pt states is is familiar with "[facility] financial policy" and that he is not interested in an appt at this time. Carron Curie, CMA

## 2012-09-03 ENCOUNTER — Ambulatory Visit: Payer: Medicare PPO | Admitting: Cardiology

## 2012-09-10 ENCOUNTER — Other Ambulatory Visit: Payer: Self-pay | Admitting: Internal Medicine

## 2012-09-13 ENCOUNTER — Observation Stay (HOSPITAL_COMMUNITY)
Admission: EM | Admit: 2012-09-13 | Discharge: 2012-09-16 | Discharge status: Against Medical Advice | Payer: Medicare PPO | Attending: Internal Medicine | Admitting: Internal Medicine

## 2012-09-13 ENCOUNTER — Emergency Department (HOSPITAL_COMMUNITY): Payer: Medicare PPO

## 2012-09-13 ENCOUNTER — Encounter (HOSPITAL_COMMUNITY): Payer: Self-pay | Admitting: Emergency Medicine

## 2012-09-13 DIAGNOSIS — R1013 Epigastric pain: Secondary | ICD-10-CM | POA: Diagnosis present

## 2012-09-13 DIAGNOSIS — I2510 Atherosclerotic heart disease of native coronary artery without angina pectoris: Secondary | ICD-10-CM | POA: Diagnosis present

## 2012-09-13 DIAGNOSIS — I509 Heart failure, unspecified: Secondary | ICD-10-CM | POA: Insufficient documentation

## 2012-09-13 DIAGNOSIS — I5022 Chronic systolic (congestive) heart failure: Secondary | ICD-10-CM | POA: Diagnosis present

## 2012-09-13 DIAGNOSIS — J4489 Other specified chronic obstructive pulmonary disease: Secondary | ICD-10-CM | POA: Insufficient documentation

## 2012-09-13 DIAGNOSIS — D696 Thrombocytopenia, unspecified: Secondary | ICD-10-CM | POA: Diagnosis present

## 2012-09-13 DIAGNOSIS — K8020 Calculus of gallbladder without cholecystitis without obstruction: Secondary | ICD-10-CM | POA: Diagnosis present

## 2012-09-13 DIAGNOSIS — R112 Nausea with vomiting, unspecified: Secondary | ICD-10-CM | POA: Diagnosis present

## 2012-09-13 DIAGNOSIS — I10 Essential (primary) hypertension: Secondary | ICD-10-CM | POA: Diagnosis present

## 2012-09-13 DIAGNOSIS — Z79899 Other long term (current) drug therapy: Secondary | ICD-10-CM | POA: Insufficient documentation

## 2012-09-13 DIAGNOSIS — Z9861 Coronary angioplasty status: Secondary | ICD-10-CM | POA: Insufficient documentation

## 2012-09-13 DIAGNOSIS — R1011 Right upper quadrant pain: Secondary | ICD-10-CM

## 2012-09-13 DIAGNOSIS — R079 Chest pain, unspecified: Principal | ICD-10-CM | POA: Diagnosis present

## 2012-09-13 DIAGNOSIS — J449 Chronic obstructive pulmonary disease, unspecified: Secondary | ICD-10-CM | POA: Diagnosis present

## 2012-09-13 DIAGNOSIS — I252 Old myocardial infarction: Secondary | ICD-10-CM | POA: Insufficient documentation

## 2012-09-13 DIAGNOSIS — R932 Abnormal findings on diagnostic imaging of liver and biliary tract: Secondary | ICD-10-CM

## 2012-09-13 LAB — CBC
HCT: 43 % (ref 39.0–52.0)
MCH: 30.1 pg (ref 26.0–34.0)
MCHC: 35.1 g/dL (ref 30.0–36.0)
MCV: 85.7 fL (ref 78.0–100.0)
RDW: 13.9 % (ref 11.5–15.5)

## 2012-09-13 LAB — POCT I-STAT, CHEM 8
Calcium, Ion: 1.24 mmol/L (ref 1.13–1.30)
Creatinine, Ser: 1.2 mg/dL (ref 0.50–1.35)
Glucose, Bld: 139 mg/dL — ABNORMAL HIGH (ref 70–99)
Hemoglobin: 15 g/dL (ref 13.0–17.0)
TCO2: 24 mmol/L (ref 0–100)

## 2012-09-13 MED ORDER — NITROGLYCERIN 0.4 MG SL SUBL
0.4000 mg | SUBLINGUAL_TABLET | SUBLINGUAL | Status: DC | PRN
  Filled 2012-09-13: qty 25

## 2012-09-13 MED ORDER — ONDANSETRON HCL 4 MG/2ML IJ SOLN
4.0000 mg | Freq: Once | INTRAMUSCULAR | Status: AC
  Administered 2012-09-13: 4 mg via INTRAVENOUS
  Filled 2012-09-13: qty 2

## 2012-09-13 MED ORDER — MORPHINE SULFATE 4 MG/ML IJ SOLN
4.0000 mg | Freq: Once | INTRAMUSCULAR | Status: AC
  Administered 2012-09-13: 4 mg via INTRAVENOUS
  Filled 2012-09-13: qty 1

## 2012-09-13 MED ORDER — IOHEXOL 350 MG/ML SOLN
100.0000 mL | Freq: Once | INTRAVENOUS | Status: AC | PRN
  Administered 2012-09-13: 100 mL via INTRAVENOUS

## 2012-09-13 MED ORDER — ASPIRIN 325 MG PO TABS
325.0000 mg | ORAL_TABLET | ORAL | Status: AC
  Administered 2012-09-13: 325 mg via ORAL
  Filled 2012-09-13: qty 1

## 2012-09-13 MED ORDER — GI COCKTAIL ~~LOC~~
30.0000 mL | Freq: Once | ORAL | Status: AC
  Administered 2012-09-13: 30 mL via ORAL
  Filled 2012-09-13: qty 30

## 2012-09-13 NOTE — ED Notes (Signed)
Pt desatted to 85%, pt placed on 3L via [place]. Pt now 92% on 3L via [place].

## 2012-09-13 NOTE — ED Notes (Signed)
CT paged. 

## 2012-09-13 NOTE — ED Notes (Signed)
RN transported pt to CT 

## 2012-09-13 NOTE — ED Notes (Signed)
Pt. returned from XR. 

## 2012-09-13 NOTE — ED Provider Notes (Signed)
Medical screening examination/treatment/procedure(s) were conducted as a shared visit with non-physician practitioner(s) and myself.  I personally evaluated the patient during the encounter  Pt seen and examined--labs and xrays reviewed, will require admission for eval of abd/epigastric pain  Toy Baker, MD 09/13/12 [id number]

## 2012-09-13 NOTE — ED Notes (Signed)
Pt complaining of CP and severe back pain. Pt has hx of ruptured AAA.

## 2012-09-13 NOTE — ED Notes (Signed)
Pt transported to XR.  

## 2012-09-13 NOTE — ED Provider Notes (Signed)
History     CSN: [id number]  Arrival date & time 09/13/12  2121   First MD Initiated Contact with Patient 09/13/12 2125      Chief Complaint  Patient presents with  . Chest Pain    (Consider location/radiation/quality/duration/timing/severity/associated sxs/prior treatment) HPI Comments: Patient developed chest pain, while mowing the lawn about 5 PM this, afternoon.  He took nitroglycerin, without any results.  Pain has been unchanged.  Slightly worse than his previous chest pains.  He has a history of abdominal aortic aneurysms repaired.  He also has a second recurrent abdominal aneurysm on last measurement, one year ago, was 4.6 cm.  He has stable common iliac artery aneurysms as well.  He, states has been seen by a specialist in [place] last being several months ago.  They are just monitoring his aneurysms.  Patient is a 79 y.o. male presenting with chest pain. The history is provided by the patient.  Chest Pain Pain location:  Substernal area and epigastric Pain quality: pressure   Pain radiates to:  Mid back Pain radiates to the back: yes   Pain severity:  Moderate Onset quality:  Sudden Timing:  Constant Progression:  Unchanged Chronicity:  New Relieved by:  Nothing Worsened by:  Exertion Ineffective treatments:  Nitroglycerin Associated symptoms: abdominal pain, nausea, shortness of breath and weakness   Associated symptoms: no cough, no diaphoresis, no dizziness, no fever, no palpitations and not vomiting   Risk factors: aortic disease, coronary artery disease, hypertension and male sex     Past Medical History  Diagnosis Date  . Kidney tumor (benign)   . Chronic headache   . Hypertension   . Nephrolithiasis   . Renal insufficiency   . Peripheral vascular disease   . BPH (benign prostatic hypertrophy)   . CAD (coronary artery disease)   . Detached retina   . Asthma   . Allergic rhinitis   . Sleep apnea   . Pulmonary fibrosis Jan 2009    very subtle  possible fibrosis noted on CT Jan 2009.  Unchanged since Oct 2006. Deemed possibly due to chlorine exposure at gym work swimming pool  . COPD (chronic obstructive pulmonary disease)     2001 PFTs  - FEv1 2.5L/66%, DLCO 57% -> March 2009: Fev1 1.91L/625, Ratio 50, DLCO 50%  . AAA (abdominal aortic aneurysm)     Rupture 16 years ago  . History of colonic diverticulitis   . Aneurysm, common iliac artery   . Myocardial infarction     minor in 2000, 2001  . Aortic insufficiency   . Chronic systolic CHF (congestive heart failure)     Past Surgical History  Procedure Laterality Date  . Nephrectomy  2003     left partial, benign tumor  . Vasectomy    . Ptca  2001 and 2002  . Abdominal aortic aneurysm repair  1997  . Cataract extraction      bilateral  . Cardiac catheterization  07/22/02    EF 55%  . Abdominal aortic aneurysm repair    . Eye surgery      Family History  Problem Relation Age of Onset  . Aneurysm Father 106    deceased  . Other Father     AAA  . Hypertension Mother   . Other Mother     amputation (leg)    History  Substance Use Topics  . Smoking status: Former Smoker -- 1.00 packs/day for 50 years    Types: Cigarettes  Quit date: 04/23/1997  . Smokeless tobacco: Never Used  . Alcohol Use: No      Review of Systems  Constitutional: Negative for fever and diaphoresis.  Respiratory: Positive for shortness of breath. Negative for cough.   Cardiovascular: Positive for chest pain. Negative for palpitations and leg swelling.  Gastrointestinal: Positive for nausea and abdominal pain. Negative for vomiting.  Skin: Positive for pallor.  Neurological: Positive for weakness. Negative for dizziness.  All other systems reviewed and are negative.    Allergies  Beta adrenergic blockers  Home Medications   No current outpatient prescriptions on file.  BP 130/64  Pulse 96  Temp(Src) 98.7 F (37.1 C) (Oral)  Resp 19  Ht 6\' 1"  (1.854 m)  Wt 212 lb 11.9 oz  (96.5 kg)  BMI 28.07 kg/m2  SpO2 91%  Physical Exam  Nursing note and vitals reviewed. Constitutional: He appears well-developed and well-nourished.  HENT:  Head: Normocephalic and atraumatic.  Eyes: Pupils are equal, round, and reactive to light.  Neck: Normal range of motion.  Cardiovascular: Normal rate and regular rhythm.   No murmur heard. Pulmonary/Chest: Effort normal and breath sounds normal. No respiratory distress. He has no wheezes.  Abdominal: Soft. He exhibits no distension. There is no tenderness.  Musculoskeletal: Normal range of motion. He exhibits no tenderness.  Neurological: He is alert.  Skin: Skin is warm and dry. There is pallor.    ED Course  Procedures (including critical care time)  Labs Reviewed  CBC - Abnormal; Notable for the following:    Platelets 115 (*)    All other components within normal limits  PRO B NATRIURETIC PEPTIDE - Abnormal; Notable for the following:    Pro B Natriuretic peptide (BNP) 811.3 (*)    All other components within normal limits  HEPATIC FUNCTION PANEL - Abnormal; Notable for the following:    Albumin 3.4 (*)    All other components within normal limits  BASIC METABOLIC PANEL - Abnormal; Notable for the following:    Glucose, Bld 139 (*)    GFR calc non Af Amer 58 (*)    GFR calc Af Amer 67 (*)    All other components within normal limits  CBC - Abnormal; Notable for the following:    WBC 12.6 (*)    Platelets 109 (*)    All other components within normal limits  URINALYSIS, ROUTINE W REFLEX MICROSCOPIC - Abnormal; Notable for the following:    Hgb urine dipstick TRACE (*)    Urobilinogen, UA 2.0 (*)    All other components within normal limits  COMPREHENSIVE METABOLIC PANEL - Abnormal; Notable for the following:    Sodium 134 (*)    Glucose, Bld 116 (*)    Total Protein 5.9 (*)    Albumin 3.1 (*)    GFR calc non Af Amer 69 (*)    GFR calc Af Amer 80 (*)    All other components within normal limits  URINE  MICROSCOPIC-ADD ON - Abnormal; Notable for the following:    Squamous Epithelial / LPF FEW (*)    Casts HYALINE CASTS (*)    All other components within normal limits  POCT I-STAT, CHEM 8 - Abnormal; Notable for the following:    Glucose, Bld 139 (*)    All other components within normal limits  MRSA PCR SCREENING  TROPONIN I  TROPONIN I  TROPONIN I  LIPASE, BLOOD  CBC  POCT I-STAT TROPONIN I  POCT I-STAT TROPONIN I  Dg Chest 2 View  09/13/2012   *RADIOLOGY REPORT*  Clinical Data: Chest pain  CHEST - 2 VIEW  Comparison: 06/16/2010  Findings: Aorta is ectatic and unfolded.  Cardiac leads are in place.  Mild cardiomegaly noted with central vascular congestion but no overt edema.  No pleural effusion.  No acute osseous finding.  IMPRESSION: Cardiomegaly with aortic ectasia but no focal acute finding.   Original Report Authenticated By: Christiana Pellant, M.D.   US Abdomen Complete  09/14/2012   *RADIOLOGY REPORT*  Clinical Data:  Cholelithiasis, elevated LFT  COMPLETE ABDOMINAL ULTRASOUND  Comparison:  CT 09/13/2012  Findings:  Gallbladder:  There are multiple small gallstones within the gallbladder lumen.  One larger mobile gallstone measures 1.5 cm. The gallbladder wall is mildly thickened 5 mm.  Negative sonographic Murphy's sign.  No pericholecystic fluid.  Common bile duct:  Dilated at 9 mm.  Liver:  No focal lesion identified.  Within normal limits in parenchymal echogenicity.  IVC:  Appears normal.  Pancreas:  No focal abnormality seen.  Spleen:  Normal in size and echogenicity  Right Kidney:  10.7cm in length.  There is a 2.5 cm cyst in the lower pole which appears benign.  Left Kidney:  9.6cm in length.  No evidence of hydronephrosis or stones.  Abdominal aorta:  No aneurysm identified.  IMPRESSION:  1.  Multiple gallstones with thickened gallbladder wall may represent chronic cholecystitis.  No sonographic Murphy's sign to suggest acute cholecystitis.  Recommend clinical correlation. 2.   Dilatation of the common bile duct.  If  patient has abnormal liver function test, consider MRCP to exclude distal obstructing stone.   Original Report Authenticated By: Genevive Bi, M.D.   Ct Angio Abd/pel W/ And/or W/o  09/13/2012   *RADIOLOGY REPORT*  Clinical Data: Abdominal pain, abdominal aortic aneurysm with previous repair.  CT ANGIOGRAPHY ABDOMEN AND PELVIS WITH CONTRAST AND WITHOUT CONTRAST  Comparison: 09/12/2011  Findings: There is no other changes are noted at the lung bases with curvilinear subpleural scarring or atelectasis.  Noncontrast images demonstrate evidence of previous infrarenal abdominal aortic aneurysm repair with stable proximal aortic ectasia measuring 4.7 cm image 28.  No hyperdense intramural hematoma.  Minimal stranding about the aorta is likely postoperative and stable.  Gallstone noted without other CT evidence for acute cholecystitis. Areas of left renal cortical scarring and cortical atrophy. Nonobstructing 1 mm left renal calculi are noted, for example image 36.  No radiopaque ureteral calculus.  Layering dependent bladder calculi are re-identified.  After administration of contrast, hypodense lesion.  Atheromatous aortic plaque is redemonstrated.  No dissection flap is identified. Stable bilateral common iliac arterial ectasia, 1.7 cm on the left image 139 and 1.6 cm on the right image 142.  Streak artifact from the patient's arms is noted.  Liver, adrenal glands, spleen, pancreas are normal.  Right lower renal pole cyst is stable.  Radiopaque object within the colon are most likely ingested pill fragment.  No ascites, free air, or lymphadenopathy.  Fat containing ventral hernias are redemonstrated.  Normal appendix. No bowel wall thickening or focal segmental dilatation.  Multilevel disc degenerative change is noted.  Schmorl's node formation at multiple levels including L2-L3 again noted.  IMPRESSION: No acute intra-abdominal or pelvic pathology.  Specifically, stable  evidence of aortic aneurysm repair without dissection or evidence for rupture.  Gallstones and left renal calculi with bladder calculi redemonstrated.   Original Report Authenticated By: Christiana Pellant, M.D.     1. Chest pain at rest  2. Abdominal pain, epigastric   3. Abdominal pain, right upper quadrant     ED ECG REPORT   Date: 09/14/2012  EKG Time: 7:51 PM  Rate: 69  Rhythm: normal sinus rhythm, rhythm","unchanged from previous tracings"first degree AV block  Axis: left  Intervals:none  ST&T Change: none  Narrative Interpretation: abnormal             MDM   Labs in CT angiography abdomen reviewed.  There is stable, abdominal aneurysms, but no indication of dissection.  He's had multiple doses of IV morphine for pain, without any change is also had a GI cocktail, without resolution of his pain.  I've asked cycled cardiac markers, performed and will admit patient       Arman Filter, NP 09/14/12 1951

## 2012-09-13 NOTE — ED Notes (Signed)
PT. REPORTS MID CHEST PAIN WITH SOB ( COPD) AND NAUSEA ONSET THIS AFTERNOON , TOOK 1 NTG SL AND 1 TYLENOL PTA WITH NO RELIEF , HISTORY OF CAD UNDER THE SERVICES OF [facility] .

## 2012-09-14 ENCOUNTER — Observation Stay (HOSPITAL_COMMUNITY): Payer: Medicare PPO

## 2012-09-14 ENCOUNTER — Encounter (HOSPITAL_COMMUNITY): Payer: Self-pay | Admitting: *Deleted

## 2012-09-14 DIAGNOSIS — D696 Thrombocytopenia, unspecified: Secondary | ICD-10-CM | POA: Diagnosis present

## 2012-09-14 DIAGNOSIS — K8020 Calculus of gallbladder without cholecystitis without obstruction: Secondary | ICD-10-CM

## 2012-09-14 DIAGNOSIS — R112 Nausea with vomiting, unspecified: Secondary | ICD-10-CM

## 2012-09-14 DIAGNOSIS — R932 Abnormal findings on diagnostic imaging of liver and biliary tract: Secondary | ICD-10-CM

## 2012-09-14 DIAGNOSIS — R1011 Right upper quadrant pain: Secondary | ICD-10-CM

## 2012-09-14 DIAGNOSIS — R1013 Epigastric pain: Secondary | ICD-10-CM

## 2012-09-14 DIAGNOSIS — R079 Chest pain, unspecified: Secondary | ICD-10-CM | POA: Diagnosis present

## 2012-09-14 DIAGNOSIS — J449 Chronic obstructive pulmonary disease, unspecified: Secondary | ICD-10-CM

## 2012-09-14 HISTORY — DX: Calculus of gallbladder without cholecystitis without obstruction: K80.20

## 2012-09-14 LAB — TROPONIN I: Troponin I: <0.3 ng/mL (ref <0.30)

## 2012-09-14 LAB — URINALYSIS, ROUTINE W REFLEX MICROSCOPIC
Bilirubin Urine: NEGATIVE
Nitrite: NEGATIVE
Specific Gravity, Urine: 1.029 (ref 1.005–1.030)
pH: 6 (ref 5.0–8.0)

## 2012-09-14 LAB — COMPREHENSIVE METABOLIC PANEL
ALT: 17 U/L (ref 0–53)
CO2: 21 mEq/L (ref 19–32)
Calcium: 9 mg/dL (ref 8.4–10.5)
Chloride: 103 mEq/L (ref 96–112)
Creatinine, Ser: 1 mg/dL (ref 0.50–1.35)
GFR calc Af Amer: 80 mL/min — ABNORMAL LOW (ref >90)
GFR calc non Af Amer: 69 mL/min — ABNORMAL LOW (ref >90)
Glucose, Bld: 116 mg/dL — ABNORMAL HIGH (ref 70–99)
Sodium: 134 mEq/L — ABNORMAL LOW (ref 135–145)
Total Bilirubin: 0.8 mg/dL (ref 0.3–1.2)

## 2012-09-14 LAB — CBC
HCT: 44.7 % (ref 39.0–52.0)
Hemoglobin: 15.5 g/dL (ref 13.0–17.0)
MCH: 30.2 pg (ref 26.0–34.0)
MCHC: 34.7 g/dL (ref 30.0–36.0)

## 2012-09-14 LAB — BASIC METABOLIC PANEL
BUN: 17 mg/dL (ref 6–23)
CO2: 27 mEq/L (ref 19–32)
Chloride: 103 mEq/L (ref 96–112)
GFR calc Af Amer: 67 mL/min — ABNORMAL LOW (ref >90)
Glucose, Bld: 139 mg/dL — ABNORMAL HIGH (ref 70–99)
Potassium: 4.5 mEq/L (ref 3.5–5.1)

## 2012-09-14 LAB — HEPATIC FUNCTION PANEL
ALT: 15 U/L (ref 0–53)
Alkaline Phosphatase: 58 U/L (ref 39–117)
Bilirubin, Direct: <0.1 mg/dL (ref 0.0–0.3)

## 2012-09-14 LAB — URINE MICROSCOPIC-ADD ON

## 2012-09-14 MED ORDER — DOXAZOSIN MESYLATE 8 MG PO TABS
8.0000 mg | ORAL_TABLET | Freq: Every day | ORAL | Status: DC
  Administered 2012-09-14 – 2012-09-15 (×2): 8 mg via ORAL
  Filled 2012-09-14 (×3): qty 1

## 2012-09-14 MED ORDER — TIOTROPIUM BROMIDE MONOHYDRATE 18 MCG IN CAPS
18.0000 ug | ORAL_CAPSULE | Freq: Every day | RESPIRATORY_TRACT | Status: DC
  Administered 2012-09-14: 18 ug via RESPIRATORY_TRACT
  Filled 2012-09-14: qty 5

## 2012-09-14 MED ORDER — ZOLPIDEM TARTRATE 5 MG PO TABS: 5.0000 mg | ORAL_TABLET | Freq: Every evening | ORAL | Status: DC | PRN

## 2012-09-14 MED ORDER — ONDANSETRON HCL 4 MG/2ML IJ SOLN: 4.0000 mg | Freq: Four times a day (QID) | INTRAMUSCULAR | Status: DC | PRN

## 2012-09-14 MED ORDER — PANTOPRAZOLE SODIUM 40 MG IV SOLR
40.0000 mg | Freq: Two times a day (BID) | INTRAVENOUS | Status: DC
  Administered 2012-09-14: 40 mg via INTRAVENOUS
  Filled 2012-09-14 (×3): qty 40

## 2012-09-14 MED ORDER — FINASTERIDE 5 MG PO TABS
5.0000 mg | ORAL_TABLET | Freq: Every day | ORAL | Status: DC
  Administered 2012-09-14 – 2012-09-15 (×2): 5 mg via ORAL
  Filled 2012-09-14 (×3): qty 1

## 2012-09-14 MED ORDER — PROMETHAZINE HCL 25 MG/ML IJ SOLN: 25.0000 mg | Freq: Four times a day (QID) | INTRAMUSCULAR | Status: DC | PRN

## 2012-09-14 MED ORDER — SENNA 8.6 MG PO TABS
1.0000 | ORAL_TABLET | Freq: Three times a day (TID) | ORAL | Status: DC | PRN
  Filled 2012-09-14: qty 1

## 2012-09-14 MED ORDER — SODIUM CHLORIDE 0.9 % IV SOLN
INTRAVENOUS | Status: DC
  Administered 2012-09-14 – 2012-09-16 (×3): via INTRAVENOUS

## 2012-09-14 MED ORDER — NITROGLYCERIN IN D5W 200-5 MCG/ML-% IV SOLN
5.0000 ug/min | INTRAVENOUS | Status: DC
  Administered 2012-09-14: 5 ug/min via INTRAVENOUS
  Filled 2012-09-14: qty 250

## 2012-09-14 MED ORDER — LISINOPRIL 40 MG PO TABS
40.0000 mg | ORAL_TABLET | Freq: Every day | ORAL | Status: DC
  Filled 2012-09-14: qty 1

## 2012-09-14 MED ORDER — MORPHINE SULFATE 10 MG/ML IJ SOLN: 24.0000 mg | INTRAMUSCULAR | Status: DC | PRN

## 2012-09-14 MED ORDER — ALUM & MAG HYDROXIDE-SIMETH 200-200-20 MG/5ML PO SUSP: 30.0000 mL | Freq: Four times a day (QID) | ORAL | Status: DC | PRN

## 2012-09-14 MED ORDER — PROMETHAZINE HCL 25 MG/ML IJ SOLN
25.0000 mg | Freq: Once | INTRAMUSCULAR | Status: AC
  Administered 2012-09-14: 25 mg via INTRAVENOUS
  Filled 2012-09-14: qty 1

## 2012-09-14 MED ORDER — PANTOPRAZOLE SODIUM 40 MG IV SOLR
40.0000 mg | INTRAVENOUS | Status: DC
  Administered 2012-09-15 – 2012-09-16 (×2): 40 mg via INTRAVENOUS
  Filled 2012-09-14 (×2): qty 40

## 2012-09-14 MED ORDER — ONDANSETRON HCL 4 MG PO TABS: 4.0000 mg | ORAL_TABLET | Freq: Four times a day (QID) | ORAL | Status: DC | PRN

## 2012-09-14 MED ORDER — ENOXAPARIN SODIUM 40 MG/0.4ML ~~LOC~~ SOLN
40.0000 mg | SUBCUTANEOUS | Status: DC
  Administered 2012-09-16: 40 mg via SUBCUTANEOUS
  Filled 2012-09-14 (×3): qty 0.4

## 2012-09-14 MED ORDER — SODIUM CHLORIDE 0.9 % IV SOLN
INTRAVENOUS | Status: DC
  Administered 2012-09-14: 20 mL via INTRAVENOUS

## 2012-09-14 MED ORDER — POLYETHYLENE GLYCOL 3350 17 G PO PACK
17.0000 g | PACK | Freq: Every day | ORAL | Status: DC | PRN
  Filled 2012-09-14: qty 1

## 2012-09-14 MED ORDER — ACETAMINOPHEN 325 MG PO TABS: 650.0000 mg | ORAL_TABLET | Freq: Four times a day (QID) | ORAL | Status: DC | PRN

## 2012-09-14 MED ORDER — OXYCODONE HCL 5 MG PO TABS
5.0000 mg | ORAL_TABLET | ORAL | Status: DC | PRN
  Administered 2012-09-14: 5 mg via ORAL
  Filled 2012-09-14: qty 1

## 2012-09-14 MED ORDER — ACETAMINOPHEN 650 MG RE SUPP: 650.0000 mg | Freq: Four times a day (QID) | RECTAL | Status: DC | PRN

## 2012-09-14 MED ORDER — HYDROMORPHONE HCL PF 1 MG/ML IJ SOLN
0.5000 mg | INTRAMUSCULAR | Status: DC | PRN
  Administered 2012-09-14: 1 mg via INTRAVENOUS
  Administered 2012-09-14: 0.5 mg via INTRAVENOUS
  Administered 2012-09-15 – 2012-09-16 (×5): 1 mg via INTRAVENOUS
  Filled 2012-09-14 (×8): qty 1

## 2012-09-14 NOTE — Progress Notes (Addendum)
[facility]  79 y/ o male admitted this morning for chest pain and vomiting. CT chest negative.  Started on Nitro infusion. CT abd/pelvis reveals gallstones without acute cholecystitis changes.  LFTs not elevated. GI consulted and doubt that it is cholecystitis.  Cardiac enzymes negative and therefore will d/c Nitro now.  Pt examined and chart reviewed.   A/P 1. Chest pain- likely GI related 2. Vomiting/ epigastric pain/ cholelithiasis- actually tender throughout upper abdomen- doubt cholecystitis- gastroenteritis? NPO, slow IVF (poor EF), Protonix 3 nephrolithiasis and bladder stones- noted on Ct 4. H/o CAD 5. COPD stable 6 HTN- hold lisinopril 7. BPH- 240 cc post void residual -  start Flomax as tolerated by nausea 8. Chronic systolic CHF- carefull with IVF 9. Chronic thrombocytopenia- stable  Transfer to med/surg  Calvert Cantor, MD [phone]

## 2012-09-14 NOTE — H&P (Addendum)
[facility] History and Physical  CLEAVEN DEMARIO [name]:[id number] DOB: February 25, 1931 DOA: 09/13/2012  Referring physician: EDP PCP: Judie Petit, MD  Specialists:   Chief Complaint: Chest Pain and Nausea and Vomiting  HPI: [name] is a 79 y.o. male who presents to the ED with complaints of chest and Epigastric ABD Pain  along with nausea and vomiting since 5 pm on 05/24.  He took a SL NTG at home without relief, and he took an 81 mg Aspirin and went to the ED.   He denies having any fevers or chills, or diarrhea.   He also denies having hematemesis.  Since he has a history of an AAA, a CTA of the ABD was performed to evaluate for a possible rupture and it was found to be negative for signs of a dissection or rupture but found Gallstones.   He was referred for medical admission.      Review of Systems: The patient denies anorexia, fever, chills, headaches, weight loss, vision loss, decreased hearing, hoarseness, syncope, dyspnea on exertion, peripheral edema, balance deficits, hemoptysis, abdominal pain, vomiting, diarrhea, constipation, hematemesis, melena, hematochezia, severe indigestion/heartburn, hematuria, incontinence, dysuria, suspicious skin lesions, transient blindness, difficulty walking, depression, unusual weight change, abnormal bleeding, enlarged lymph nodes, angioedema, and breast masses.    Past Medical History  Diagnosis Date  . Kidney tumor (benign)   . Chronic headache   . Hypertension   . Nephrolithiasis   . Renal insufficiency   . Peripheral vascular disease   . BPH (benign prostatic hypertrophy)   . CAD (coronary artery disease)   . Detached retina   . Asthma   . Allergic rhinitis   . Sleep apnea   . Pulmonary fibrosis Jan 2009    very subtle possible fibrosis noted on CT Jan 2009.  Unchanged since Oct 2006. Deemed possibly due to chlorine exposure at gym work swimming pool  . COPD (chronic obstructive pulmonary disease)     2001 PFTs  - FEv1 2.5L/66%,  DLCO 57% -> March 2009: Fev1 1.91L/625, Ratio 50, DLCO 50%  . AAA (abdominal aortic aneurysm)     Rupture 16 years ago  . History of colonic diverticulitis   . Aneurysm, common iliac artery   . Myocardial infarction     minor in 2000, 2001  . Aortic insufficiency   . Chronic systolic CHF (congestive heart failure)      Past Surgical History  Procedure Laterality Date  . Nephrectomy  2003     left partial, benign tumor  . Vasectomy    . Ptca  2001 and 2002  . Abdominal aortic aneurysm repair  1997  . Cataract extraction      bilateral  . Cardiac catheterization  07/22/02    EF 55%  . Abdominal aortic aneurysm repair    . Eye surgery       Medications:  HOME MEDS: Prior to Admission medications   Medication Sig Start Date End Date Taking? Authorizing Provider  acetaminophen (TYLENOL) 325 MG tablet Take 650 mg by mouth every 6 (six) hours as needed for pain.   Yes Historical Provider, MD  aspirin 81 MG tablet Take 81 mg by mouth daily.     Yes Historical Provider, MD  doxazosin (CARDURA) 8 MG tablet Take 8 mg by mouth at bedtime. 06/22/11 06/21/16 Yes Bruce Romilda Garret, MD  finasteride (PROSCAR) 5 MG tablet Take 5 mg by mouth at bedtime. 06/22/11 06/21/16 Yes Lindley Magnus, MD  HYDROcodone-acetaminophen (NORCO) 5-325 MG per  tablet Take 1 tablet by mouth daily as needed for pain. 09/21/10  Yes Lindley Magnus, MD  ibuprofen (ADVIL,MOTRIN) 800 MG tablet Take 800 mg by mouth every 8 (eight) hours as needed.    Yes Historical Provider, MD  lisinopril (PRINIVIL,ZESTRIL) 40 MG tablet Take 1 tablet (40 mg total) by mouth daily. 05/26/12 05/26/17 Yes Peter M [place], MD  LORazepam (ATIVAN) 1 MG tablet Take 1 mg by mouth daily as needed for anxiety.   Yes Historical Provider, MD  nitroGLYCERIN (NITROSTAT) 0.4 MG SL tablet Place 1 tablet (0.4 mg total) under the tongue every 5 (five) minutes as needed for chest pain. 04/14/12  Yes Peter M [place], MD  omeprazole (PRILOSEC) 20 MG capsule Take 20 mg by mouth  2 (two) times daily.   Yes Historical Provider, MD  polyethylene glycol (MIRALAX / GLYCOLAX) packet Take 17 g by mouth daily as needed (constipation).    Yes Historical Provider, MD  senna (SENOKOT) 8.6 MG tablet Take 1 tablet by mouth 3 (three) times daily as needed. Hold for loose stools   Yes Historical Provider, MD  tiotropium (SPIRIVA) 18 MCG inhalation capsule Place 1 capsule (18 mcg total) into inhaler and inhale daily. 04/19/11  Yes Kalman Shan, MD  zolpidem (AMBIEN) 5 MG tablet Take 1 tablet (5 mg total) by mouth at bedtime as needed. 04/10/11  Yes Lindley Magnus, MD    Allergies:  Allergies  Allergen Reactions  . Beta Adrenergic Blockers Other (See Comments)    Difficulty breathing    Social History:   reports that he quit smoking about 15 years ago. His smoking use included Cigarettes. He has a 50 pack-year smoking history. He has never used smokeless tobacco. He reports that he does not drink alcohol or use illicit drugs.  Family History: Family History  Problem Relation Age of Onset  . Aneurysm Father 28    deceased  . Other Father     AAA  . Hypertension Mother   . Other Mother     amputation (leg)     Physical Exam:  GEN:  Pleasant 79 year old well nourished and well developed Elderly Caucasian Male examined  and in discomfort but no acute distress; cooperative with exam Filed Vitals:   09/14/12 0300 09/14/12 0330 09/14/12 0400 09/14/12 0500  BP: 152/122 150/80 149/74 138/72  Pulse: 106 98 99 101  Temp:  98.4 F (36.9 C) 98.7 F (37.1 C)   TempSrc:  Oral Oral   Resp: 16  20 20   Height:  6\' 1"  (1.854 m)    Weight:  96.5 kg (212 lb 11.9 oz)    SpO2: 94% 95% 93% 95%   Blood pressure 138/72, pulse 101, temperature 98.7 F (37.1 C), temperature source Oral, resp. rate 20, height 6\' 1"  (1.854 m), weight 96.5 kg (212 lb 11.9 oz), SpO2 95.00%. PSYCH: He is alert and oriented x4; does not appear anxious does not appear depressed; affect is normal HEENT:  Normocephalic and Atraumatic, Mucous membranes pink; PERRLA; EOM intact; Fundi:  Benign;  No scleral icterus, Nares: Patent, Oropharynx: Clear, Edentulous  Neck:  FROM, no cervical lymphadenopathy nor thyromegaly or carotid bruit; no JVD; Breasts:: Not examined CHEST WALL: No tenderness CHEST: Normal respiration, clear to auscultation bilaterally HEART: Regular rate and rhythm; no murmurs rubs or gallops BACK: No kyphosis or scoliosis; no CVA tenderness ABDOMEN: Positive decreased Bowel Sounds, soft Mildly distended, nontender; no masses, no organomegaly.    Rectal Exam: Not done EXTREMITIES: No cyanosis,  clubbing or edema; no ulcerations. Genitalia: not examined PULSES: 2+ and symmetric SKIN: Normal hydration no rash or ulceration CNS: Cranial nerves 2-12 grossly intact no focal neurologic deficit   Labs & Imaging Results for orders placed during the hospital encounter of 09/13/12 (from the past 48 hour(s))  CBC     Status: Abnormal   Collection Time    09/13/12  9:29 PM      Result Value Range   WBC 10.0  4.0 - 10.5 K/uL   RBC 5.02  4.22 - 5.81 MIL/uL   Hemoglobin 15.1  13.0 - 17.0 g/dL   HCT 40.9  81.1 - 91.4 %   MCV 85.7  78.0 - 100.0 fL   MCH 30.1  26.0 - 34.0 pg   MCHC 35.1  30.0 - 36.0 g/dL   RDW 78.2  95.6 - 21.3 %   Platelets 115 (*) 150 - 400 K/uL   Comment: CONSISTENT WITH PREVIOUS RESULT  PRO B NATRIURETIC PEPTIDE     Status: Abnormal   Collection Time    09/13/12  9:29 PM      Result Value Range   Pro B Natriuretic peptide (BNP) 811.3 (*) 0 - 450 pg/mL  HEPATIC FUNCTION PANEL     Status: Abnormal   Collection Time    09/13/12  9:48 PM      Result Value Range   Total Protein 6.1  6.0 - 8.3 g/dL   Albumin 3.4 (*) 3.5 - 5.2 g/dL   AST 15  0 - 37 U/L   ALT 15  0 - 53 U/L   Alkaline Phosphatase 58  39 - 117 U/L   Total Bilirubin 0.3  0.3 - 1.2 mg/dL   Bilirubin, Direct <0.8  0.0 - 0.3 mg/dL   Indirect Bilirubin NOT CALCULATED  0.3 - 0.9 mg/dL  POCT I-STAT  TROPONIN I     Status: None   Collection Time    09/13/12 10:14 PM      Result Value Range   Troponin i, poc 0.02  0.00 - 0.08 ng/mL   Comment 3            Comment: Due to the release kinetics of cTnI,     a negative result within the first hours     of the onset of symptoms does not rule out     myocardial infarction with certainty.     If myocardial infarction is still suspected,     repeat the test at appropriate intervals.  POCT I-STAT, CHEM 8     Status: Abnormal   Collection Time    09/13/12 10:18 PM      Result Value Range   Sodium 140  135 - 145 mEq/L   Potassium 4.0  3.5 - 5.1 mEq/L   Chloride 108  96 - 112 mEq/L   BUN 20  6 - 23 mg/dL   Creatinine, Ser 6.57  0.50 - 1.35 mg/dL   Glucose, Bld 846 (*) 70 - 99 mg/dL   Calcium, Ion 9.62  9.52 - 1.30 mmol/L   TCO2 24  0 - 100 mmol/L   Hemoglobin 15.0  13.0 - 17.0 g/dL   HCT 84.1  32.4 - 40.1 %  POCT I-STAT TROPONIN I     Status: None   Collection Time    09/14/12 12:17 AM      Result Value Range   Troponin i, poc 0.01  0.00 - 0.08 ng/mL   Comment 3  Comment: Due to the release kinetics of cTnI,     a negative result within the first hours     of the onset of symptoms does not rule out     myocardial infarction with certainty.     If myocardial infarction is still suspected,     repeat the test at appropriate intervals.  TROPONIN I     Status: None   Collection Time    09/14/12  2:45 AM      Result Value Range   Troponin I <0.30  <0.30 ng/mL   Comment:            Due to the release kinetics of cTnI,     a negative result within the first hours     of the onset of symptoms does not rule out     myocardial infarction with certainty.     If myocardial infarction is still suspected,     repeat the test at appropriate intervals.  MRSA PCR SCREENING     Status: None   Collection Time    09/14/12  3:35 AM      Result Value Range   MRSA by PCR NEGATIVE  NEGATIVE   Comment:            The GeneXpert MRSA  Assay (FDA     approved for NASAL specimens     only), is one component of a     comprehensive MRSA colonization     surveillance program. It is not     intended to diagnose MRSA     infection nor to guide or     monitor treatment for     MRSA infections.  BASIC METABOLIC PANEL     Status: Abnormal   Collection Time    09/14/12  5:45 AM      Result Value Range   Sodium 137  135 - 145 mEq/L   Potassium 4.5  3.5 - 5.1 mEq/L   Chloride 103  96 - 112 mEq/L   CO2 27  19 - 32 mEq/L   Glucose, Bld 139 (*) 70 - 99 mg/dL   BUN 17  6 - 23 mg/dL   Creatinine, Ser 1.61  0.50 - 1.35 mg/dL   Calcium 9.4  8.4 - 09.6 mg/dL   GFR calc non Af Amer 58 (*) >90 mL/min   GFR calc Af Amer 67 (*) >90 mL/min   Comment:            The eGFR has been calculated     using the CKD EPI equation.     This calculation has not been     validated in all clinical     situations.     eGFR's persistently     <90 mL/min signify     possible Chronic Kidney Disease.  CBC     Status: Abnormal   Collection Time    09/14/12  5:45 AM      Result Value Range   WBC 12.6 (*) 4.0 - 10.5 K/uL   RBC 5.14  4.22 - 5.81 MIL/uL   Hemoglobin 15.5  13.0 - 17.0 g/dL   HCT 04.5  40.9 - 81.1 %   MCV 87.0  78.0 - 100.0 fL   MCH 30.2  26.0 - 34.0 pg   MCHC 34.7  30.0 - 36.0 g/dL   RDW 91.4  78.2 - 95.6 %   Platelets 109 (*) 150 - 400 K/uL   Comment: CONSISTENT WITH PREVIOUS RESULT  Radiological Exams on Admission: Dg Chest 2 View  09/13/2012   *RADIOLOGY REPORT*  Clinical Data: Chest pain  CHEST - 2 VIEW  Comparison: 06/16/2010  Findings: Aorta is ectatic and unfolded.  Cardiac leads are in place.  Mild cardiomegaly noted with central vascular congestion but no overt edema.  No pleural effusion.  No acute osseous finding.  IMPRESSION: Cardiomegaly with aortic ectasia but no focal acute finding.   Original Report Authenticated By: Christiana Pellant, M.D.   Ct Angio Abd/pel W/ And/or W/o  09/13/2012   *RADIOLOGY REPORT*   Clinical Data: Abdominal pain, abdominal aortic aneurysm with previous repair.  CT ANGIOGRAPHY ABDOMEN AND PELVIS WITH CONTRAST AND WITHOUT CONTRAST  Comparison: 09/12/2011  Findings: There is no other changes are noted at the lung bases with curvilinear subpleural scarring or atelectasis.  Noncontrast images demonstrate evidence of previous infrarenal abdominal aortic aneurysm repair with stable proximal aortic ectasia measuring 4.7 cm image 28.  No hyperdense intramural hematoma.  Minimal stranding about the aorta is likely postoperative and stable.  Gallstone noted without other CT evidence for acute cholecystitis. Areas of left renal cortical scarring and cortical atrophy. Nonobstructing 1 mm left renal calculi are noted, for example image 36.  No radiopaque ureteral calculus.  Layering dependent bladder calculi are re-identified.  After administration of contrast, hypodense lesion.  Atheromatous aortic plaque is redemonstrated.  No dissection flap is identified. Stable bilateral common iliac arterial ectasia, 1.7 cm on the left image 139 and 1.6 cm on the right image 142.  Streak artifact from the patient's arms is noted.  Liver, adrenal glands, spleen, pancreas are normal.  Right lower renal pole cyst is stable.  Radiopaque object within the colon are most likely ingested pill fragment.  No ascites, free air, or lymphadenopathy.  Fat containing ventral hernias are redemonstrated.  Normal appendix. No bowel wall thickening or focal segmental dilatation.  Multilevel disc degenerative change is noted.  Schmorl's node formation at multiple levels including L2-L3 again noted.  IMPRESSION: No acute intra-abdominal or pelvic pathology.  Specifically, stable evidence of aortic aneurysm repair without dissection or evidence for rupture.  Gallstones and left renal calculi with bladder calculi redemonstrated.   Original Report Authenticated By: Christiana Pellant, M.D.    EKG: Independently reviewed. Normal Sinus Rhythm  without acute S-T changes.     Assessment/Plan Active Problems:   * No active hospital problems. * Chest Pain Epigastric Pain Cholelithiasis CAD COPD HTN Nausea and Vomiting   1.  Chestt Pain-  Monitor for arrhtyhmias, cycle troponins, IV NTG drip,   2.  Epigastic Pain - May be related to #1, and to #3,  Pain control with IV Dilaudid, and IV Protonix and IV Anti-Emetics PRN.    3.   Cholelithiasis-  GI Consulted for evaluation, seen by Dr Arlyce Dice in 2011 had Endoscopy.  Dr Vikki Ports on Call to see 05/25 AM.  Pain control PRN.  Monitor LFTs.    4.   CAD-  Hx, See # 1   5.   COPD -stable  PRN inhalers  6.  HTN-  On Lisinopril Rx.    7.  Nausea and Vomiting-  Due to #3, and possible due to #1.     8.  SCDs for DVT prophylaxis      Code Status: FULL CODE Family Communication:  Wife at Bedside Disposition Plan:  Return to Home on discharge   Time spent: 37 Minutes     Ron Parker [facility] Pager [phone]  If 7PM-7AM, please contact night-coverage [email or web address] Password [facility] 09/14/2012, 7:27 AM

## 2012-09-14 NOTE — ED Provider Notes (Signed)
Medical screening examination/treatment/procedure(s) were performed by non-physician practitioner and as supervising physician I was immediately available for consultation/collaboration.  Charmaine Placido T Mareesa Gathright, MD 09/14/12 [id number] 

## 2012-09-14 NOTE — Progress Notes (Signed)
Upper abdominal ultrasound reviewed, shows 5 mm thick gall bladder wall and multiple mobile stones, 9 mm CBD without stones. We will proceed with EGD in am.  Since LFT's are normal.and the stones were present  In 2001.

## 2012-09-14 NOTE — Progress Notes (Signed)
Report from Night RN. Chart reviewed together. Handoff complete.Introductions complete. Will continue to monitor and advise attending as needed.   

## 2012-09-14 NOTE — Consult Note (Signed)
[facility] Consult: 12:29 PM 09/14/2012   Referring Provider: Della Goo MD Primary Care Physician:  Judie Petit, MD Primary Gastroenterologist:  Dr. Melvia Heaps  Reason for Consultation:  Abdominal pain   HPI: [name] is a 79 y.o. male.  S/p AAA repair before 2001.  Gallstone on ultrasound in 2001.  Hx adenomatous colon polyps.   Came to ED late yesterday evening with several hours of epigastic pain, eventually radiated down to pelvis and lower abdomen where it currently remains.  Intense 10/10 score.   Pain similar to AAA pain, GERD pain, and bladder/kidney stone pain:  All three of which he has had before.  No fever or chills.  No blood or CG in emesis. No blood in urine.  Pain relieved with Dilaudid.  Nausea and minor amount of non-bloody emesis was not a significant symptom.  Not having any today.   Tends to weekly bouts of constipation, relieved with Miralax and or Senna.  Last constipation was earlier this past week.  Had formed brown stool earlier yesterday, several hours before the pain started.  CT scan obtained given hx of AAA.  This showed nothing acute.  Gallstone without cholecystitis noted. Pancreas normal.  No dilated biliary ducts noted.  He did have stones in urinary bladder and left kidney redemonstrated.   LFTs normal.  Lipase not assayed. Cardiac enzymes normal thus far on 2 assays.  .      Past Medical History  Diagnosis Date  . Kidney tumor (benign)   . Chronic headache   . Hypertension   . Nephrolithiasis.   Has passed at least 5 stones in his adult lifetime.    . Renal insufficiency   . Peripheral vascular disease   . BPH (benign prostatic hypertrophy)   . CAD (coronary artery disease)   . Detached retina   . Asthma   . Allergic rhinitis   . Sleep apnea   . Pulmonary fibrosis Jan 2009    very subtle possible fibrosis noted on CT Jan 2009.  Unchanged since Oct 2006. Deemed possibly due to  chlorine exposure at gym work swimming pool  . COPD (chronic obstructive pulmonary disease)     2001 PFTs  - FEv1 2.5L/66%, DLCO 57% -> March 2009: Fev1 1.91L/625, Ratio 50, DLCO 50%  . AAA (abdominal aortic aneurysm)     Rupture 16 years ago  . History of colonic diverticulitis   . Aneurysm, common iliac artery   . Myocardial infarction     minor in 2000, 2001  . Aortic insufficiency   . Chronic systolic CHF (congestive heart failure)     Past Surgical History  Procedure Laterality Date  . Nephrectomy  2003     left partial, benign tumor  . Vasectomy    . Ptca  2001 and 2002  . Abdominal aortic aneurysm repair  1997  . Cataract extraction      bilateral  . Cardiac catheterization  07/22/02    EF 55%  . Abdominal aortic aneurysm repair    . Eye surgery      Prior to Admission medications   Medication Sig Start Date End Date Taking? Authorizing Provider  acetaminophen (TYLENOL) 325 MG tablet Take 650 mg by mouth every 6 (six) hours as needed for pain.   Yes Historical Provider, MD  aspirin 81 MG tablet Take 81 mg by mouth daily.     Yes Historical Provider, MD  doxazosin (CARDURA) 8 MG tablet Take 8 mg by mouth at bedtime.  06/22/11 06/21/16 Yes Bruce Romilda Garret, MD  finasteride (PROSCAR) 5 MG tablet Take 5 mg by mouth at bedtime. 06/22/11 06/21/16 Yes Bruce Romilda Garret, MD  HYDROcodone-acetaminophen (NORCO) 5-325 MG per tablet Take 1 tablet by mouth daily as needed for pain. 09/21/10  Yes Lindley Magnus, MD  ibuprofen (ADVIL,MOTRIN) 800 MG tablet Take 800 mg by mouth every 8 (eight) hours as needed.    Yes Historical Provider, MD  lisinopril (PRINIVIL,ZESTRIL) 40 MG tablet Take 1 tablet (40 mg total) by mouth daily. 05/26/12 05/26/17 Yes Peter M Swaziland, MD  LORazepam (ATIVAN) 1 MG tablet Take 1 mg by mouth daily as needed for anxiety.   Yes Historical Provider, MD  nitroGLYCERIN (NITROSTAT) 0.4 MG SL tablet Place 1 tablet (0.4 mg total) under the tongue every 5 (five) minutes as needed for chest  pain. 04/14/12  Yes Peter M Swazil[place], MD  omeprazole (PRILOSEC) 20 MG capsule Take 20 mg by mouth 2 (two) times daily.   Yes Historical Provider, MD  polyethylene glycol (MIRALAX / GLYCOLAX) packet Take 17 g by mouth daily as needed (constipation).    Yes Historical Provider, MD  senna (SENOKOT) 8.6 MG tablet Take 1 tablet by mouth 3 (three) times daily as needed. Hold for loose stools   Yes Historical Provider, MD  tiotropium (SPIRIVA) 18 MCG inhalation capsule Place 1 capsule (18 mcg total) into inhaler and inhale daily. 04/19/11  Yes Kalman Shan, MD  zolpidem (AMBIEN) 5 MG tablet Take 1 tablet (5 mg total) by mouth at bedtime as needed. 04/10/11  Yes Lindley Magnus, MD    Scheduled Meds: . doxazosin  8 mg Oral QHS  . enoxaparin (LOVENOX) injection  40 mg Subcutaneous Q24H  . finasteride  5 mg Oral QHS  . lisinopril  40 mg Oral Daily  . [START ON 09/15/2012] pantoprazole (PROTONIX) IV  40 mg Intravenous Q24H  . tiotropium  18 mcg Inhalation Daily   Infusions: . sodium chloride 75 mL/hr at 09/14/12 1200   PRN Meds: acetaminophen, acetaminophen, alum & mag hydroxide-simeth, HYDROmorphone (DILAUDID) injection, nitroGLYCERIN, ondansetron (ZOFRAN) IV, ondansetron, oxyCODONE, polyethylene glycol, promethazine, senna, zolpidem   Allergies as of 09/13/2012 - Review Complete 09/13/2012  Allergen Reaction Noted  . Beta adrenergic blockers Other (See Comments) 05/09/2007    Family History  Problem Relation Age of Onset  . Aneurysm Father 71    deceased  . Other Father     AAA  . Hypertension Mother   . Other Mother     amputation (leg)    History   Social History  . Marital Status: Married    Spouse Name: N/A    Number of Children: N/A  . Years of Education: N/A   Occupational History  . 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 Smoking status: Former Smoker -- 1.00 packs/day for 50 years    Types: Cigarettes    Quit date: 04/23/1997  .  Smokeless tobacco: Never Used  . Alcohol Use: No  . Drug Use: No  . Sexually Active: Not on file    Social History Narrative   Pt states had chlorine exposure x 35 yrs.     REVIEW OF SYSTEMS: No weight loss.  No general anorexia:  appetitie generally good No dysphagia No blood in stool Last UTI was a few months ago per his recall.  No nose bleeds No headaches. No falls No pedal edema No dental pain. No  rashes or non-healilng sores on skin No  Chest pain or palpitations.    PHYSICAL EXAM: Vital signs in last 24 hours: Temp:  [98.2 F (36.8 C)-98.7 F (37.1 C)] 98.2 F (36.8 C) (05/25 0740) Pulse Rate:  [78-106] 97 (05/25 1100) Resp:  [0-24] 22 (05/25 1100) BP: (115-163)/(50-122) 121/65 mmHg (05/25 1100) SpO2:  [87 %-96 %] 94 % (05/25 1100) FiO2 (%):  [2 %] 2 % (05/25 0330) Weight:  [96.5 kg (212 lb 11.9 oz)] 96.5 kg (212 lb 11.9 oz) (05/25 0330)  General: looks unwell and uncomfortable.  Elderly WM.  He is drowsy.  Head:  No asymmetry or facial edema  Eyes:  No icterus or pallor Ears:  HOH  Nose:  No discharge Mouth:  Clear, dry MM.  No sores or exudates Neck:  No JVD, no bruits.  No mass or TMG Lungs:  Clear in front.  Reduced BS overall.  Heart: RRR. Rate in 90s.  No MRG Abdomen:  Soft, Tender generally but worse in RUQ and in low bellly/supra pubic area.  No mass, no bruits, no HSM.  No guard or rebound.   Rectal: deferred   Musc/Skeltl: no joint swelling or redness Extremities:  No pedal edema  Neurologic:  Moves all 4s, oriented x 3.  No tremor.  Is drowsy from meds.  Is HOH Skin:  No rash or sores. Tattoos:  None seen Nodes:  No inguinal adenopthy   Psych:  Pleasant, relaxed.   Intake/Output from previous day: 05/24 0701 - 05/25 0700 In: 476.5 [P.O.:120; I.V.:356.5] Out: 575 [Urine:575] Intake/Output this shift: Total I/O In: 375 [I.V.:375] Out: 400 [Urine:400]  LAB RESULTS:  Recent Labs  09/13/12 2129 09/13/12 2218 09/14/12 0545  WBC 10.0   --  12.6*  HGB 15.1 15.0 15.5  HCT 43.0 44.0 44.7  PLT 115*  --  109*   BMET Lab Results  Component Value Date   NA 137 09/14/2012   NA 140 09/13/2012   NA 144 09/11/2011   K 4.5 09/14/2012   K 4.0 09/13/2012   K 3.9 09/11/2011   CL 103 09/14/2012   CL 108 09/13/2012   CL 112 09/11/2011   CO2 27 09/14/2012   CO2 22 09/11/2011   CO2 24 04/10/2011   GLUCOSE 139* 09/14/2012   GLUCOSE 139* 09/13/2012   GLUCOSE 112* 09/11/2011   BUN 17 09/14/2012   BUN 20 09/13/2012   BUN 19 09/11/2011   CREATININE 1.16 09/14/2012   CREATININE 1.20 09/13/2012   CREATININE 1.39* 09/11/2011   CALCIUM 9.4 09/14/2012   CALCIUM 9.2 09/11/2011   CALCIUM 9.4 04/10/2011   LFT  Recent Labs  09/13/12 2148  PROT 6.1  ALBUMIN 3.4*  AST 15  ALT 15  ALKPHOS 58  BILITOT 0.3  BILIDIR <0.1  IBILI NOT CALCULATED   PT/INR No results found for this basename: INR, PROTIME   Hepatitis Panel No results found for this basename: HEPBSAG, HCVAB, HEPAIGM, HEPBIGM,  in the last 72 hours   RADIOLOGY STUDIES: Dg Chest 2 View 09/13/2012   *RADIOLOGY REPORT*  Clinical Data: Chest pain  CHEST - 2 VIEW  Comparison: 06/16/2010  Findings: Aorta is ectatic and unfolded.  Cardiac leads are in place.  Mild cardiomegaly noted with central vascular congestion but no overt edema.  No pleural effusion.  No acute osseous finding.  IMPRESSION: Cardiomegaly with aortic ectasia but no focal acute finding.   Original Report Authenticated By: Christiana Pellant, M.D.   Ct Angio Abd/pel W/ And/or W/o  09/13/2012    Findings: There is no other changes are noted at the lung bases with curvilinear subpleural scarring or atelectasis.  Noncontrast images demonstrate evidence of previous infrarenal abdominal aortic aneurysm repair with stable proximal aortic ectasia measuring 4.7 cm image 28.  No hyperdense intramural hematoma.  Minimal stranding about the aorta is likely postoperative and stable.  Gallstone noted without other CT evidence for acute  cholecystitis. Areas of left renal cortical scarring and cortical atrophy. Nonobstructing 1 mm left renal calculi are noted, for example image 36.  No radiopaque ureteral calculus.  Layering dependent bladder calculi are re-identified.  After administration of contrast, hypodense lesion.  Atheromatous aortic plaque is redemonstrated.  No dissection flap is identified. Stable bilateral common iliac arterial ectasia, 1.7 cm on the left image 139 and 1.6 cm on the right image 142.  Streak artifact from the patient's arms is noted.  Liver, adrenal glands, spleen, pancreas are normal.  Right lower renal pole cyst is stable.  Radiopaque object within the colon are most likely ingested pill fragment.  No ascites, free air, or lymphadenopathy.  Fat containing ventral hernias are redemonstrated.  Normal appendix. No bowel wall thickening or focal segmental dilatation.  Multilevel disc degenerative change is noted.  Schmorl's node formation at multiple levels including L2-L3 again noted.  IMPRESSION: No acute intra-abdominal or pelvic pathology.  Specifically, stable evidence of aortic aneurysm repair without dissection or evidence for rupture.  Gallstones and left renal calculi with bladder calculi redemonstrated.   Original Report Authenticated By: Christiana Pellant, M.D.    ENDOSCOPIC STUDIES: 04/2009  Colonoscopy for history of pre-cancerous (adenomatous) colon polyps ENDOSCOPIC IMPRESSION:  1) Two polyps in the mid transverse colon  2) Moderate diverticulosis in the sigmoid colon  3) Otherwise normal examination  RECOMMENDATIONS:  1) colonoscopy in 3 years due to limitations of exam (related to  Prep) Pathology - tubular adenoma (two fragments). - polypoid fragment of benign colonic mucosa - no high grade dysplasia or malignancy identified.   IMPRESSION: *  Abdominal pain, started at epigastrium but now in lower abdomen.  No evidence of recurrent aortic pathology.  Sxs similar  to previous kidney and  bladder stones, less simiiliar to GERD sxs. Has kidney, bladder, gall stones on CT.  LFTs are normal WBCs rising.  *  Leukocytosis.   PLAN: *  Ordered abdominal ultrasound as well as UA and bladder scan.  *  Consider urology, or gen surgery consults depending on outcomes of above tests.  * repeat LFTs in AM along with Lipase and CBC.  *  Leave on IV Protonix for now. But could go to oral.   *  After ultrasound, consider food.  Unless EGD pursued.      LOS: 1 day   Jennye Moccasin  09/14/2012, 12:29 PM Pager: [phone]  Addendum  Bladder scan shows 240 ML or urine after pt voided.  RN will contact Hospitalist to see what they would like to do with this info.  Updated wife and Dr Butler Denmark as to what I have done.   Attending MD note:   I have reviewed the above note, examined the patient and agree with plan of treatment.in the setting of normal LFT's and abdominal pain consistent with biliary colic, HIDA scan and/or EGD may help to narrow down the diagnosis. Eill reassess in am  [facility] Pager # [phone] new

## 2012-09-14 NOTE — Progress Notes (Signed)
Pt with 240 mL urine visualized by bladder scan. MD advised and orders rec'd. Will continue to monitor and advise attending as needed.

## 2012-09-15 ENCOUNTER — Encounter (HOSPITAL_COMMUNITY): Payer: Self-pay | Admitting: *Deleted

## 2012-09-15 ENCOUNTER — Encounter (HOSPITAL_COMMUNITY)
Admission: EM | Discharge status: Against Medical Advice | Payer: Self-pay | Source: Home / Self Care | Attending: Emergency Medicine

## 2012-09-15 HISTORY — PX: ESOPHAGOGASTRODUODENOSCOPY: SHX5428

## 2012-09-15 LAB — CBC
HCT: 42.3 % (ref 39.0–52.0)
Hemoglobin: 14.3 g/dL (ref 13.0–17.0)
MCV: 88.3 fL (ref 78.0–100.0)
RDW: 14.2 % (ref 11.5–15.5)
WBC: 14.2 10*3/uL — ABNORMAL HIGH (ref 4.0–10.5)

## 2012-09-15 LAB — LIPASE, BLOOD: Lipase: 10 U/L — ABNORMAL LOW (ref 11–59)

## 2012-09-15 SURGERY — EGD (ESOPHAGOGASTRODUODENOSCOPY)
Anesthesia: Moderate Sedation

## 2012-09-15 MED ORDER — ASPIRIN EC 81 MG PO TBEC
81.0000 mg | DELAYED_RELEASE_TABLET | Freq: Every day | ORAL | Status: DC
  Administered 2012-09-15 – 2012-09-16 (×2): 81 mg via ORAL
  Filled 2012-09-15 (×2): qty 1

## 2012-09-15 MED ORDER — FENTANYL CITRATE 0.05 MG/ML IJ SOLN
INTRAMUSCULAR | Status: AC
  Filled 2012-09-15: qty 2

## 2012-09-15 MED ORDER — FENTANYL CITRATE 0.05 MG/ML IJ SOLN
INTRAMUSCULAR | Status: DC | PRN
  Administered 2012-09-15 (×2): 12.5 ug via INTRAVENOUS

## 2012-09-15 MED ORDER — MIDAZOLAM HCL 5 MG/ML IJ SOLN
INTRAMUSCULAR | Status: AC
  Filled 2012-09-15: qty 1

## 2012-09-15 MED ORDER — PROMETHAZINE HCL 25 MG/ML IJ SOLN
12.5000 mg | Freq: Four times a day (QID) | INTRAMUSCULAR | Status: DC | PRN
Start: 2012-09-15 — End: 2012-09-16
  Filled 2012-09-15: qty 1

## 2012-09-15 MED ORDER — LORAZEPAM 1 MG PO TABS: 1.0000 mg | ORAL_TABLET | Freq: Every day | ORAL | Status: DC | PRN

## 2012-09-15 MED ORDER — MIDAZOLAM HCL 10 MG/2ML IJ SOLN
INTRAMUSCULAR | Status: DC | PRN
  Administered 2012-09-15 (×3): 1 mg via INTRAVENOUS

## 2012-09-15 MED ORDER — ASPIRIN 81 MG PO TABS: 81.0000 mg | ORAL_TABLET | Freq: Every day | ORAL | Status: DC

## 2012-09-15 NOTE — Op Note (Signed)
[facility] [address] [place], [address]   ENDOSCOPY PROCEDURE REPORT  PATIENT: [name], [name]  MR#: [id number] BIRTHDATE: 06-01-1930 , 81  yrs. old GENDER: Male ENDOSCOPIST: Hart Carwin, MD REFERRED BY:  Dr Della Goo PROCEDURE DATE:  09/15/2012 PROCEDURE:  EGD, diagnostic  , aborted dASA CLASS:     Class III INDICATIONS:  Epigastric pain.   Dyspepsia.  , gall stones, normal LFT's MEDICATIONS: These medications were titrated to patient response per physician's verbal order, Versed 3 mg IV, and Fentanyl 25 mcg IV TOPICAL ANESTHETIC: Cetacaine Spray  DESCRIPTION OF PROCEDURE: After the risks benefits and alternatives of the procedure were thoroughly explained, informed consent was obtained.  The PENTAX GASTOROSCOPE [id number] endoscope was introduced through the mouth and advanced to the pharynx    Wi Pt started to gag and was unble to cooperate, scope was never able to pass ito the esophagus either due to spasm or an anatomic restriction   no images for this exam     [  COMPLICATIONS: There were no complications. ENDOSCOPIC IMPRESSION  unsuccessful upper endoscopy, aborted at the pharynx , this has occurred during prior attempts according to the pt RECOMMENDATIONS:  We will proceed with Barium esophagram,UGI and SBFT tomorrow to assess abd.pain REPEAT EXAM: no  eSigned:  Hart Carwin, MD 09/15/2012 11:18 AM   CC:

## 2012-09-15 NOTE — Progress Notes (Signed)
NURSING PROGRESS NOTE  [name] [id number] Transfer Data: 09/15/2012 6:06 PM Attending Provider: Lonia Blood, MD [id number],ONGEX Sherilyn Cooter, MD Code Status: full  [name] is a 79 y.o. male patient transferred from 2900 -No acute distress noted.  -No complaints of shortness of breath.  -No complaints of chest pain.     Blood pressure 134/74, pulse 98, temperature 99.7 F (37.6 C), temperature source Oral, resp. rate 18, height 6\' 1"  (1.854 m), weight 96.9 kg (213 lb 10 oz), SpO2 93.00%.   IV Fluids:  IV in place, occlusive dsg intact without redness, IV cath hand right, condition patent and no redness normal saline.   Allergies:  Beta adrenergic blockers  Past Medical History:   has a past medical history of Kidney tumor (benign); Chronic headache; Hypertension; Nephrolithiasis; Renal insufficiency; Peripheral vascular disease; BPH (benign prostatic hypertrophy); CAD (coronary artery disease); Detached retina; Asthma; Allergic rhinitis; Sleep apnea; Pulmonary fibrosis (Jan 2009); COPD (chronic obstructive pulmonary disease); AAA (abdominal aortic aneurysm); History of colonic diverticulitis; Aneurysm, common iliac artery; Myocardial infarction; Aortic insufficiency; and Chronic systolic CHF (congestive heart failure).  Past Surgical History:   has past surgical history that includes Nephrectomy (2003); Vasectomy; Mitral valve replacement (2001 and 2002); Abdominal aortic aneurysm repair (1997); Cataract extraction; Cardiac catheterization (07/22/02); Abdominal aortic aneurysm repair; and Eye surgery.  Social History:   reports that he quit smoking about 15 years ago. His smoking use included Cigarettes. He has a 50 pack-year smoking history. He has never used smokeless tobacco. He reports that he does not drink alcohol or use illicit drugs.  Skin: warm dry and intact  Admission inpatient armband information verified with patient/family to include name and date of birth and placed  on patient arm. Side rails up x 2, fall assessment and education completed with patient/family. Patient/family able to verbalize understanding of risk associated with falls and verbalized understanding to call for assistance before getting out of bed. Call light within reach. Patient/family able to voice and demonstrate understanding of unit orientation instructions.    Will continue to evaluate and treat per MD orders.

## 2012-09-15 NOTE — Progress Notes (Signed)
[facility] Progress Note [place] TEAM 1 - Stepdown/ICU TEAM   SALVATORE SHEAR ZOX:[id number] DOB: Dec 22, 1930 DOA: 09/13/2012 PCP: Judie Petit, MD  Brief narrative: 79 y.o. male who presented to the ED with complaints of chest and epigastric pain along with nausea and vomiting since 5 pm on 05/24. He took a SL NTG at home without relief, and he took an 81 mg aspirin and went to the ED. He denied having any fevers, chills, or diarrhea. He also denied hematemesis. Since he has a history of an AAA, a CTA of the ABD was performed to evaluate for a possible rupture and it was found to be negative for signs of a dissection or rupture but + for gallstones.   Assessment/Plan:  Chest pain likely GI related - resolved - troponin negative x3 - no acute EKG changes   Vomiting/ epigastric and abdominal pain/ cholelithiasis tender throughout entire abdomen today - doubt cholecystitis - gastroenteritis? - NPO, slow IVF, Protonix - 9 mm CBD without stones - lipase is low - LFTs are normal - GI plans EGD for today - ?ischemic colitis - will check LDH    nephrolithiasis and bladder stones noted on Ct   H/o CAD  inferior posterior myocardial infarction in May of 2000 - had stenting of the distal circumflex - repeat cardiac catheterization in 2001 demonstrated that the stent was occluded and the vessel filled by right to left collaterals - followed by Dr. 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  He c/o generalized abdom pain with nausea.  He denies chest pain, sob, or diarrhea.  Objective: Blood pressure 125/62, pulse 94, temperature 98.5 F (36.9 C), temperature source Oral, resp. rate 21, height 6\' 1"  (1.854 m), weight 96.5 kg (212 lb 11.9 oz), SpO2 94.00%.  Intake/Output Summary (Last 24 hours) at 09/15/12 0959 Last data filed at 09/15/12 0900  Gross per 24 hour  Intake    891 ml  Output    725 ml  Net    166 ml   Exam: General: No acute respiratory distress Lungs: Clear to auscultation bilaterally without wheezes or crackles Cardiovascular: Regular rate and rhythm without murmur gallop or rub normal S1 and S2 Abdomen: tender diffusely, nondistended, soft, bowel sounds hypoactive but present, no rebound, no ascites, no appreciable mass Extremities: No significant cyanosis, clubbing, or edema bilateral lower extremities  Data Reviewed: Basic Metabolic Panel:  Recent Labs Lab 09/13/12 2218 09/14/12 0545 09/14/12 1406  NA 140 137 134*  K 4.0 4.5 4.5  CL 108 103 103  CO2  --  27 21  GLUCOSE 139* 139* 116*  BUN  20 17 15   CREATININE 1.20 1.16 1.00  CALCIUM  --  9.4 9.0   Liver Function Tests:  Recent Labs Lab 09/13/12 2148 09/14/12 1406  AST 15 21  ALT 15 17  ALKPHOS 58 51  BILITOT 0.3 0.8  PROT 6.1 5.9*  ALBUMIN 3.4* 3.1*    Recent Labs Lab 09/15/12 0550  LIPASE 10*   CBC:  Recent Labs Lab 09/13/12 2129 09/13/12 2218 09/14/12 0545 09/15/12 0550  WBC 10.0  --  12.6* 14.2*  HGB 15.1 15.0 15.5 14.3  HCT 43.0 44.0 44.7 42.3  MCV 85.7  --  87.0 88.3  PLT 115*  --  109* 88*    Cardiac Enzymes:  Recent Labs Lab 09/14/12 0245 09/14/12 0900 09/14/12 1406  TROPONINI <0.30 <0.30 <0.30    Recent Results (from the past 240 hour(s))  MRSA PCR SCREENING     Status: None   Collection Time    09/14/12  3:35 AM      Result Value Range Status   MRSA by PCR NEGATIVE  NEGATIVE Final   Comment:            The GeneXpert MRSA Assay (FDA     approved for NASAL specimens     only), is one component of a     comprehensive MRSA colonization     surveillance program. It is not     intended to diagnose MRSA     infection nor to guide or     monitor treatment for     MRSA infections.     Studies:  Recent x-ray studies have been reviewed in detail by the Attending Physician  Scheduled Meds:  Scheduled Meds: . doxazosin  8 mg Oral QHS  . enoxaparin (LOVENOX) injection  40 mg Subcutaneous Q24H  . finasteride  5 mg Oral QHS  . pantoprazole (PROTONIX) IV  40 mg Intravenous Q24H  . tiotropium  18 mcg Inhalation Daily   Time spent on care of this patient:   [facility] T  [facility] Office  [phone] Pager - Text Page per Loretha Stapler as per below:  On-Call/Text Page:      [email or web address]      password [email or web address]  If 7PM-7AM, please contact night-coverage [email or web address] Password [facility] 09/15/2012, 9:59 AM   LOS: 2 days     ,

## 2012-09-15 NOTE — Progress Notes (Signed)
Brief Nutrition Note:  RD pulled to chart for malnutrition screening tool report of unsure of unintentional weight loss. Pt sleeping at time of RD visit, but family states that the pt has been eating well and gaining weight despite increased exercise.   Wt Readings from Last 5 Encounters:  09/14/12 212 lb 11.9 oz (96.5 kg)  09/14/12 212 lb 11.9 oz (96.5 kg)  05/26/12 208 lb 9.6 oz (94.62 kg)  04/17/12 202 lb (91.627 kg)  04/14/12 206 lb 1.9 oz (93.495 kg)   Body mass index is 28.07 kg/(m^2). Overweight  Diet: NPO- bowel rest.   Chart reviewed, no nutrition interventions warranted at this time. If pt remains NPO >/= 7 days, recommend initiation of nutrition support. Please consult if needed.   Clarene Duke RD, LDN Pager [phone] After Hours pager [phone]

## 2012-09-15 NOTE — Progress Notes (Signed)
Patient agitated during scope insertion pulling at tube procedure terminated

## 2012-09-15 NOTE — Progress Notes (Signed)
[name] Progress Note   Subjective  Abdominal pain is better but still feels soreness in lower abdomen   Objective  cholelithiasis on ultrasound, 9 mm CBD- old finding, he had an acute abd. Pain while mowing grass which started in the epigastrium, Prior attempts for EGD aparently were unsuccessful, pt does not remember why- thinks he was not sedated properly,  Vital signs in last 24 hours: Temp:  [98 F (36.7 C)-99.8 F (37.7 C)] 98.9 F (37.2 C) (05/26 0313) Pulse Rate:  [93-101] 93 (05/26 0313) Resp:  [16-26] 22 (05/26 0313) BP: (87-132)/(52-71) 119/55 mmHg (05/26 0313) SpO2:  [87 %-94 %] 94 % (05/26 0313)   General:    white male in NAD, Heart:  Regular rate and rhythm; no murmurs Lungs: Respirations : insp rales and exp wheezes Abdomen:  Soft, tender and mildly distended. Normal bowel sounds, epigastric tenderness and RLQ tenderness, no rebound. Extremities:  Without edema. Neurologic:  Alert and oriented,  grossly normal neurologically. Psych:  Cooperative. Normal mood and affect.  Intake/Output from previous day: 05/25 0701 - 05/26 0700 In: 1001 [P.O.:100; I.V.:901] Out: 775 [Urine:775] Intake/Output this shift:    Lab Results:  Recent Labs  09/13/12 2129 09/13/12 2218 09/14/12 0545 09/15/12 0550  WBC 10.0  --  12.6* 14.2*  HGB 15.1 15.0 15.5 14.3  HCT 43.0 44.0 44.7 42.3  PLT 115*  --  109* 88*   BMET  Recent Labs  09/13/12 2218 09/14/12 0545 09/14/12 1406  NA 140 137 134*  K 4.0 4.5 4.5  CL 108 103 103  CO2  --  27 21  GLUCOSE 139* 139* 116*  BUN 20 17 15   CREATININE 1.20 1.16 1.00  CALCIUM  --  9.4 9.0   LFT  Recent Labs  09/13/12 2148 09/14/12 1406  PROT 6.1 5.9*  ALBUMIN 3.4* 3.1*  AST 15 21  ALT 15 17  ALKPHOS 58 51  BILITOT 0.3 0.8  BILIDIR <0.1  --   IBILI NOT CALCULATED  --    PT/INR No results found for this basename: LABPROT, INR,  in the last 72 hours  Studies/Results: Dg Chest 2 View  09/13/2012    *RADIOLOGY REPORT*  Clinical Data: Chest pain  CHEST - 2 VIEW  Comparison: 06/16/2010  Findings: Aorta is ectatic and unfolded.  Cardiac leads are in place.  Mild cardiomegaly noted with central vascular congestion but no overt edema.  No pleural effusion.  No acute osseous finding.  IMPRESSION: Cardiomegaly with aortic ectasia but no focal acute finding.   Original Report Authenticated By: Christiana Pellant, M.D.   US Abdomen Complete  09/14/2012   *RADIOLOGY REPORT*  Clinical Data:  Cholelithiasis, elevated LFT  COMPLETE ABDOMINAL ULTRASOUND  Comparison:  CT 09/13/2012  Findings:  Gallbladder:  There are multiple small gallstones within the gallbladder lumen.  One larger mobile gallstone measures 1.5 cm. The gallbladder wall is mildly thickened 5 mm.  Negative sonographic Murphy's sign.  No pericholecystic fluid.  Common bile duct:  Dilated at 9 mm.  Liver:  No focal lesion identified.  Within normal limits in parenchymal echogenicity.  IVC:  Appears normal.  Pancreas:  No focal abnormality seen.  Spleen:  Normal in size and echogenicity  Right Kidney:  10.7cm in length.  There is a 2.5 cm cyst in the lower pole which appears benign.  Left Kidney:  9.6cm in length.  No evidence of hydronephrosis or stones.  Abdominal aorta:  No aneurysm identified.  IMPRESSION:  1.  Multiple  gallstones with thickened gallbladder wall may represent chronic cholecystitis.  No sonographic Murphy's sign to suggest acute cholecystitis.  Recommend clinical correlation. 2.  Dilatation of the common bile duct.  If  patient has abnormal liver function test, consider MRCP to exclude distal obstructing stone.   Original Report Authenticated By: Genevive Bi, M.D.   Ct Angio Abd/pel W/ And/or W/o  09/13/2012   *RADIOLOGY REPORT*  Clinical Data: Abdominal pain, abdominal aortic aneurysm with previous repair.  CT ANGIOGRAPHY ABDOMEN AND PELVIS WITH CONTRAST AND WITHOUT CONTRAST  Comparison: 09/12/2011  Findings: There is no other changes  are noted at the lung bases with curvilinear subpleural scarring or atelectasis.  Noncontrast images demonstrate evidence of previous infrarenal abdominal aortic aneurysm repair with stable proximal aortic ectasia measuring 4.7 cm image 28.  No hyperdense intramural hematoma.  Minimal stranding about the aorta is likely postoperative and stable.  Gallstone noted without other CT evidence for acute cholecystitis. Areas of left renal cortical scarring and cortical atrophy. Nonobstructing 1 mm left renal calculi are noted, for example image 36.  No radiopaque ureteral calculus.  Layering dependent bladder calculi are re-identified.  After administration of contrast, hypodense lesion.  Atheromatous aortic plaque is redemonstrated.  No dissection flap is identified. Stable bilateral common iliac arterial ectasia, 1.7 cm on the left image 139 and 1.6 cm on the right image 142.  Streak artifact from the patient's arms is noted.  Liver, adrenal glands, spleen, pancreas are normal.  Right lower renal pole cyst is stable.  Radiopaque object within the colon are most likely ingested pill fragment.  No ascites, free air, or lymphadenopathy.  Fat containing ventral hernias are redemonstrated.  Normal appendix. No bowel wall thickening or focal segmental dilatation.  Multilevel disc degenerative change is noted.  Schmorl's node formation at multiple levels including L2-L3 again noted.  IMPRESSION: No acute intra-abdominal or pelvic pathology.  Specifically, stable evidence of aortic aneurysm repair without dissection or evidence for rupture.  Gallstones and left renal calculi with bladder calculi redemonstrated.   Original Report Authenticated By: Christiana Pellant, M.D.       Assessment:  Acute abdominal pain, slowly resolving in the setting of AAA repar which is stable on CT scan, and known gall stones, r/o PUD, pt unable to be endoscoped in the past for unclear reasons, will let me attempt EGD today Normal LFT's ,unlikely  acute cholecystitis,      Plan:  EGD today, follow CHF/EF 25-30% LFT's Bowl rest PPI  Principal Problem:   Chest pain Active Problems:   HYPERTENSION   CORONARY ARTERY DISEASE   COPD (chronic obstructive pulmonary disease)   Chronic systolic CHF (congestive heart failure)- EF 25-30%   Abdominal pain, epigastric   Cholelithiasis   Nausea and vomiting   Thrombocytopenia, unspecified   Abdominal pain, right upper quadrant   Nonspecific (abnormal) findings on radiological and other examination of biliary tract     LOS: 2 days   Lina Sar  09/15/2012, 8:08 AM

## 2012-09-16 ENCOUNTER — Observation Stay (HOSPITAL_COMMUNITY): Payer: Medicare PPO

## 2012-09-16 DIAGNOSIS — I509 Heart failure, unspecified: Secondary | ICD-10-CM

## 2012-09-16 DIAGNOSIS — J449 Chronic obstructive pulmonary disease, unspecified: Secondary | ICD-10-CM

## 2012-09-16 DIAGNOSIS — K8020 Calculus of gallbladder without cholecystitis without obstruction: Secondary | ICD-10-CM

## 2012-09-16 DIAGNOSIS — I5022 Chronic systolic (congestive) heart failure: Secondary | ICD-10-CM

## 2012-09-16 LAB — COMPREHENSIVE METABOLIC PANEL
Alkaline Phosphatase: 39 U/L (ref 39–117)
BUN: 22 mg/dL (ref 6–23)
CO2: 21 mEq/L (ref 19–32)
GFR calc Af Amer: 67 mL/min — ABNORMAL LOW (ref >90)
GFR calc non Af Amer: 57 mL/min — ABNORMAL LOW (ref >90)
Glucose, Bld: 107 mg/dL — ABNORMAL HIGH (ref 70–99)
Potassium: 4.3 mEq/L (ref 3.5–5.1)
Total Bilirubin: 1 mg/dL (ref 0.3–1.2)
Total Protein: 5.6 g/dL — ABNORMAL LOW (ref 6.0–8.3)

## 2012-09-16 LAB — CBC
HCT: 40.9 % (ref 39.0–52.0)
Platelets: 85 10*3/uL — ABNORMAL LOW (ref 150–400)
RDW: 14.1 % (ref 11.5–15.5)
WBC: 12.4 10*3/uL — ABNORMAL HIGH (ref 4.0–10.5)

## 2012-09-16 LAB — LACTATE DEHYDROGENASE: LDH: 140 U/L (ref 94–250)

## 2012-09-16 MED ORDER — ALBUTEROL SULFATE (5 MG/ML) 0.5% IN NEBU: 2.5000 mg | INHALATION_SOLUTION | RESPIRATORY_TRACT | Status: DC | PRN

## 2012-09-16 MED ORDER — PANTOPRAZOLE SODIUM 40 MG PO TBEC: 40.0000 mg | DELAYED_RELEASE_TABLET | Freq: Every day | ORAL | Status: DC

## 2012-09-16 MED ORDER — LISINOPRIL 10 MG PO TABS
10.0000 mg | ORAL_TABLET | Freq: Every day | ORAL | Status: DC
  Administered 2012-09-16: 10 mg via ORAL
  Filled 2012-09-16: qty 1

## 2012-09-16 NOTE — Progress Notes (Signed)
[facility] Daily Rounding Note 09/16/2012, 9:51 AM  SUBJECTIVE:       Still with right abdominal pain.  No nausea.  Dysphagia limited the UGI/SBFT/Esophagram this AM.   Pt upset that EGD attempted without him being "knocked out"  OBJECTIVE:         Vital signs in last 24 hours:    Temp:  [98.7 F (37.1 C)-100.4 F (38 C)] 98.7 F (37.1 C) (05/27 0506) Pulse Rate:  [90-105] 97 (05/27 0506) Resp:  [15-28] 18 (05/27 0506) BP: (99-145)/(34-74) 129/74 mmHg (05/27 0506) SpO2:  [91 %-98 %] 92 % (05/27 0506) Weight:  [96.9 kg (213 lb 10 oz)] 96.9 kg (213 lb 10 oz) (05/26 1740) Last BM Date: 09/14/12 General: looks unwell, not toxic   Heart: RRR.  No MRG Chest: clear bil.  No SOB. ENT:  Voice soft.  Abdomen: tender without guarding in the Right mid and upper abdomen.  Active BS  Extremities: no CCE Neuro/Psych:  Somewhat drowsy with flat affect yet somewhat agitated.   Intake/Output from previous day: 05/26 0701 - 05/27 0700 In: 1015 [P.O.:300; I.V.:715] Out: 775 [Urine:775]  Intake/Output this shift:    Lab Results:  Recent Labs  09/14/12 0545 09/15/12 0550 09/16/12 0435  WBC 12.6* 14.2* 12.4*  HGB 15.5 14.3 13.8  HCT 44.7 42.3 40.9  PLT 109* 88* 85*   BMET  Recent Labs  09/14/12 0545 09/14/12 1406 09/16/12 0435  NA 137 134* 136  K 4.5 4.5 4.3  CL 103 103 106  CO2 27 21 21   GLUCOSE 139* 116* 107*  BUN 17 15 22   CREATININE 1.16 1.00 1.17  CALCIUM 9.4 9.0 8.6   LFT  Recent Labs  09/13/12 2148 09/14/12 1406 09/16/12 0435  PROT 6.1 5.9* 5.6*  ALBUMIN 3.4* 3.1* 2.6*  AST 15 21 14   ALT 15 17 13   ALKPHOS 58 51 39  BILITOT 0.3 0.8 1.0  BILIDIR <0.1  --   --   IBILI NOT CALCULATED  --   --     Studies/Results: 09/16/12  ESOPHAGUS/BARIUM SWALLOW/TABLET STUDY Findings: The study was originally ordered as an upper GI and small-  bowel follow-through. That study could not be performed due to due  to difficulty the patient has swallowing. No Zenker's  diverticulum  is identified. The esophagus appeared grossly normal without  stricture or obvious mass. The stomach could not be filled with  contrast. Very limited visualization of the duodenum was  unremarkable.  IMPRESSION:  Extremely limited study demonstrates no gross abnormality of the  esophagus. No Zenker's diverticulum is seen.  US Abdomen Complete 09/14/2012   IMPRESSION:  1.  Multiple gallstones with thickened gallbladder wall may represent chronic cholecystitis.  No sonographic Murphy's sign to suggest acute cholecystitis.  Recommend clinical correlation. 2.  Dilatation of the common bile duct.  If  patient has abnormal liver function test, consider MRCP to exclude distal obstructing stone.   Original Report Authenticated By: Genevive Bi, M.D.   Ct Angio Abd/pel W/ And/or W/o  09/13/2012  IMPRESSION: No acute intra-abdominal or pelvic pathology. Specifically, stable evidence of aortic aneurysm repair without dissection or evidence for rupture. Gallstones and left renal calculi with bladder calculi redemonstrated.    ASSESMENT: *  Abdominal pain. Gallstones, GB wall thickening on CT scan and ultrasound but LFTs normal thrice. ?  Chronic cholecystitis.  No evidence of ulcer disease on suboptimal barium esophagram/UGI/SBFT.   Study limited due to dysphagia.  Unable to complete EGD  due to agitation and hypoxia limiting dosage of sedatives.  *  Dysphagia.  *  Bladder and kidney stones. Unimpressive U/A.  *  ? Dementia?.  Mild global  Atrophy on MR of 2011.   PLAN: *  Would ask gen surgery for their opinion.  *  ? Pursue SLP consult and MBSS?       LOS: 3 days   Jennye Moccasin  09/16/2012, 9:51 AM Pager: [phone]   ________________________________________________________________________  [name] MD note:  I personally examined the patient, reviewed the data.  I spoke with him with wife and speech therapist in room.  He was very upset, angry about EGD yesterday.  Was  awake and "had to fight all three of them off."  Unclear to me why he is having abd pain, not sure if GB is the source. He relates dysphagia, intermittent and his wife downplays this saying only once in a while will he clear throat during a meal but does not have obvious dysphagia to her. He has had EGDs in past, he believes with dilation, wife disagrees and I cannot find those records.  Yesterday unable to tolerate EGD and I offered to try again (with MAC sedation) if he fails bedside swallow but he became even more upset and so I left the room. He seems to be confused, poor short term memory.  Will discuss again with him later today or tomorrow.   Rob Bunting, MD [facility] Pager [phone]

## 2012-09-16 NOTE — Consult Note (Signed)
Reason for Consult: abd pain, ?chronic cholecystitis Referring Physician: Dr. Si Raider is an 79 y.o. male.  HPI: Pt is a 79 y/o M who was admitted 3d ago with epigastric/chest pain that was worked up for a cardiac event.  TPIs were neg.  Pt has had a h/o pain similar to this in the past.  He also has a h/o severe GERD for he is treated with BID prevacid.  The pt underwent US that show 5mm GB wall and stones.  No pericholecystic fluid.  Pt with pain that is better now than on admission.  Past Medical History  Diagnosis Date  . Kidney tumor (benign)   . Chronic headache   . Hypertension   . Nephrolithiasis   . Renal insufficiency   . Peripheral vascular disease   . BPH (benign prostatic hypertrophy)   . CAD (coronary artery disease)   . Detached retina   . Asthma   . Allergic rhinitis   . Sleep apnea   . Pulmonary fibrosis Jan 2009    very subtle possible fibrosis noted on CT Jan 2009.  Unchanged since Oct 2006. Deemed possibly due to chlorine exposure at gym work swimming pool  . COPD (chronic obstructive pulmonary disease)     2001 PFTs  - FEv1 2.5L/66%, DLCO 57% -> March 2009: Fev1 1.91L/625, Ratio 50, DLCO 50%  . AAA (abdominal aortic aneurysm)     Rupture 16 years ago  . History of colonic diverticulitis   . Aneurysm, common iliac artery   . Myocardial infarction     minor in 2000, 2001  . Aortic insufficiency   . Chronic systolic CHF (congestive heart failure)     Past Surgical History  Procedure Laterality Date  . Nephrectomy  2003     left partial, benign tumor  . Vasectomy    . Ptca  2001 and 2002  . Abdominal aortic aneurysm repair  1997  . Cataract extraction      bilateral  . Cardiac catheterization  07/22/02    EF 55%  . Abdominal aortic aneurysm repair    . Eye surgery      Family History  Problem Relation Age of Onset  . Aneurysm Father 47    deceased  . Other Father     AAA  . Hypertension Mother   . Other Mother     amputation (leg)     Social History:  reports that he quit smoking about 15 years ago. His smoking use included Cigarettes. He has a 50 pack-year smoking history. He has never used smokeless tobacco. He reports that he does not drink alcohol or use illicit drugs.  Allergies:  Allergies  Allergen Reactions  . Beta Adrenergic Blockers Other (See Comments)    Difficulty breathing    Medications: I have reviewed the patient's current medications.  Results for orders placed during the hospital encounter of 09/13/12 (from the past 48 hour(s))  LIPASE, BLOOD     Status: Abnormal   Collection Time    09/15/12  5:50 AM      Result Value Range   Lipase 10 (*) 11 - 59 U/L  CBC     Status: Abnormal   Collection Time    09/15/12  5:50 AM      Result Value Range   WBC 14.2 (*) 4.0 - 10.5 K/uL   RBC 4.79  4.22 - 5.81 MIL/uL   Hemoglobin 14.3  13.0 - 17.0 g/dL   HCT 42.3  39.0 - 52.0 %   MCV 88.3  78.0 - 100.0 fL   MCH 29.9  26.0 - 34.0 pg   MCHC 33.8  30.0 - 36.0 g/dL   RDW 16.1  09.6 - 04.5 %   Platelets 88 (*) 150 - 400 K/uL   Comment: CONSISTENT WITH PREVIOUS RESULT  COMPREHENSIVE METABOLIC PANEL     Status: Abnormal   Collection Time    09/16/12  4:35 AM      Result Value Range   Sodium 136  135 - 145 mEq/L   Potassium 4.3  3.5 - 5.1 mEq/L   Chloride 106  96 - 112 mEq/L   CO2 21  19 - 32 mEq/L   Glucose, Bld 107 (*) 70 - 99 mg/dL   BUN 22  6 - 23 mg/dL   Creatinine, Ser 4.09  0.50 - 1.35 mg/dL   Calcium 8.6  8.4 - 81.1 mg/dL   Total Protein 5.6 (*) 6.0 - 8.3 g/dL   Albumin 2.6 (*) 3.5 - 5.2 g/dL   AST 14  0 - 37 U/L   ALT 13  0 - 53 U/L   Alkaline Phosphatase 39  39 - 117 U/L   Total Bilirubin 1.0  0.3 - 1.2 mg/dL   GFR calc non Af Amer 57 (*) >90 mL/min   GFR calc Af Amer 67 (*) >90 mL/min   Comment:            The eGFR has been calculated     using the CKD EPI equation.     This calculation has not been     validated in all clinical     situations.     eGFR's persistently     <90  mL/min signify     possible Chronic Kidney Disease.  CBC     Status: Abnormal   Collection Time    09/16/12  4:35 AM      Result Value Range   WBC 12.4 (*) 4.0 - 10.5 K/uL   RBC 4.58  4.22 - 5.81 MIL/uL   Hemoglobin 13.8  13.0 - 17.0 g/dL   HCT 91.4  78.2 - 95.6 %   MCV 89.3  78.0 - 100.0 fL   MCH 30.1  26.0 - 34.0 pg   MCHC 33.7  30.0 - 36.0 g/dL   RDW 21.3  08.6 - 57.8 %   Platelets 85 (*) 150 - 400 K/uL   Comment: CONSISTENT WITH PREVIOUS RESULT  LACTATE DEHYDROGENASE     Status: None   Collection Time    09/16/12  4:35 AM      Result Value Range   LDH 140  94 - 250 U/L    Dg Abd 1 View  09/16/2012   *RADIOLOGY REPORT*  Clinical Data: Abdominal pain.  Difficulty swallowing.  ABDOMEN - 1 VIEW  Comparison: CT abdomen and pelvis 09/13/2012.  Findings: No evidence of bowel obstruction is identified.  There is a fairly large volume of stool in the ascending colon.  No focal bony abnormality is identified with convex right scoliosis noted.  IMPRESSION: No acute finding.  Large volume of stool ascending colon.   Original Report Authenticated By: Holley Dexter, M.D.   US Abdomen Complete  09/14/2012   *RADIOLOGY REPORT*  Clinical Data:  Cholelithiasis, elevated LFT  COMPLETE ABDOMINAL ULTRASOUND  Comparison:  CT 09/13/2012  Findings:  Gallbladder:  There are multiple small gallstones within the gallbladder lumen.  One larger mobile gallstone measures 1.5 cm. The gallbladder  wall is mildly thickened 5 mm.  Negative sonographic Murphy's sign.  No pericholecystic fluid.  Common bile duct:  Dilated at 9 mm.  Liver:  No focal lesion identified.  Within normal limits in parenchymal echogenicity.  IVC:  Appears normal.  Pancreas:  No focal abnormality seen.  Spleen:  Normal in size and echogenicity  Right Kidney:  10.7cm in length.  There is a 2.5 cm cyst in the lower pole which appears benign.  Left Kidney:  9.6cm in length.  No evidence of hydronephrosis or stones.  Abdominal aorta:  No aneurysm  identified.  IMPRESSION:  1.  Multiple gallstones with thickened gallbladder wall may represent chronic cholecystitis.  No sonographic Murphy's sign to suggest acute cholecystitis.  Recommend clinical correlation. 2.  Dilatation of the common bile duct.  If  patient has abnormal liver function test, consider MRCP to exclude distal obstructing stone.   Original Report Authenticated By: Genevive Bi, M.D.   Dg Esophagus  09/16/2012   *RADIOLOGY REPORT*  Clinical Data:Difficulty swallowing.  Endoscopy was attempted but the scope could not be passed.  ESOPHAGUS/BARIUM SWALLOW/TABLET STUDY  Technique: Upper GI series performed with high density barium and effervescent agent. Thin barium also used.  Subsequently, serial images of the small bowel were obtained including spot views of the terminal ileum.  Fluoroscopy Time: 2 minutes, 26 seconds.  Comparison: CT abdomen pelvis 09/13/2012 and PA and lateral chest 09/13/2012.  Findings: The study was originally ordered as an upper GI and small- bowel follow-through.  That study could not be performed due to due to difficulty the patient has swallowing.  No Zenker's diverticulum is identified.  The esophagus appeared grossly normal without stricture or obvious mass.  The stomach could not be filled with contrast.  Very limited visualization of the duodenum was unremarkable.  IMPRESSION: Extremely limited study demonstrates no gross abnormality of the esophagus.  No Zenker's diverticulum is seen.   Original Report Authenticated By: Holley Dexter, M.D.    Review of Systems  Constitutional: Negative.   HENT: Negative.   Respiratory: Negative.   Cardiovascular: Negative.   Gastrointestinal: Positive for abdominal pain.  Genitourinary: Negative.   Musculoskeletal: Negative.   Skin: Negative.   Neurological: Negative.   All other systems reviewed and are negative.   Blood pressure 139/69, pulse 95, temperature 98.5 F (36.9 C), temperature source Oral, resp.  rate 18, height 6\' 1"  (1.854 m), weight 213 lb 10 oz (96.9 kg), SpO2 96.00%. Physical Exam  Constitutional: He is oriented to person, place, and time. He appears well-developed and well-nourished.  HENT:  Head: Normocephalic and atraumatic.  Eyes: Conjunctivae and EOM are normal. Pupils are equal, round, and reactive to light.  Neck: Normal range of motion. Neck supple.  Cardiovascular: Normal rate, regular rhythm and normal heart sounds.   Respiratory: Effort normal and breath sounds normal.  GI: Soft. Bowel sounds are normal. He exhibits no distension and no mass. There is tenderness (RLQ > RUQ). There is no rebound and no guarding.  Musculoskeletal: Normal range of motion.  Neurological: He is alert and oriented to person, place, and time.  Skin: Skin is warm and dry.    Assessment/Plan: 79 y/o M with biliary colic. LFTs are WNL and no c/w cholecystitis.   The patient would benefit from a HIDA scan  The patient frustrated with being in the hospital and is asking to go home. Don't think the patient needs an emergent cholecystectomy.  Marigene Ehlers., Carlos Quackenbush 09/16/2012, 2:48 PM

## 2012-09-16 NOTE — Evaluation (Signed)
Clinical/Bedside Swallow Evaluation Patient Details  Name: [name] MRN: [id number] Date of Birth: [birth year]  Today's Date: 09/16/2012 Time: 6295-2841 SLP Time Calculation (min): 20 min  Past Medical History:  Past Medical History  Diagnosis Date  . Kidney tumor (benign)   . Chronic headache   . Hypertension   . Nephrolithiasis   . Renal insufficiency   . Peripheral vascular disease   . BPH (benign prostatic hypertrophy)   . CAD (coronary artery disease)   . Detached retina   . Asthma   . Allergic rhinitis   . Sleep apnea   . Pulmonary fibrosis Jan 2009    very subtle possible fibrosis noted on CT Jan 2009.  Unchanged since Oct 2006. Deemed possibly due to chlorine exposure at gym work swimming pool  . COPD (chronic obstructive pulmonary disease)     2001 PFTs  - FEv1 2.5L/66%, DLCO 57% -> March 2009: Fev1 1.91L/625, Ratio 50, DLCO 50%  . AAA (abdominal aortic aneurysm)     Rupture 16 years ago  . History of colonic diverticulitis   . Aneurysm, common iliac artery   . Myocardial infarction     minor in 2000, 2001  . Aortic insufficiency   . Chronic systolic CHF (congestive heart failure)    Past Surgical History:  Past Surgical History  Procedure Laterality Date  . Nephrectomy  2003     left partial, benign tumor  . Vasectomy    . Ptca  2001 and 2002  . Abdominal aortic aneurysm repair  1997  . Cataract extraction      bilateral  . Cardiac catheterization  07/22/02    EF 55%  . Abdominal aortic aneurysm repair    . Eye surgery     HPI:  [name] is a 79 y.o. male who presents to the ED with complaints of chest and Epigastric ABD Pain along with nausea and vomiting since 5 pm on 05/24.  Since he has a history of an AAA, a CTA of the ABD was performed to evaluate for a possible rupture and it was found to be negative for signs of a dissection or rupture but found Gallstones. He was referred for medical admission.  Other PMH includes renal insufficiency, sleep  apnea, COPD, MI.  CXR: Cardiomegaly with aortic ectasia but no focal acute finding.  Esophagram performed today The study was originally ordered as an upper GI and small- bowel follow through. That study could not be performed due to due to difficulty the patient has swallowing. No Zenker's diverticulum is identified. The esophagus appeared grossly normal without stricture or obvious mass. The stomach could not be filled with contrast. Very limited visualization of the duodenum was unremarkable.  EGD attempted yesterday, however pt. stated he felt that he could not breathe when tube was being placed and became agitated and procedure not completed.  Pt. reported having "esophagus stretched" previously.      Assessment / Plan / Recommendation Clinical Impression  Pt. did not exhibit s/s aspiration or oropharyngeal dysphagia during assessment.  He complained of globus sensation in pharynx/chest (?)  Esophagram documentation noted pt. had difficutly swallowing.  This was clarified that pt. "felt like he was smothering," and no aspiration was noted during one sip.  SLP suspects pt. is experiencing esophageal dysphagia and SLP reviewed esophageal precuations.  Pt. has COPD and SLP explained relationship between respiration and deglutition.  Recommend he resume regular texture diet and thin liquids with esophageal precautions.  No ST needed.  Aspiration Risk  Moderate    Diet Recommendation Regular;Thin liquid   Liquid Administration via: Cup Medication Administration: Whole meds with liquid Supervision: Patient able to self feed;Intermittent supervision to cue for compensatory strategies Compensations: Slow rate;Small sips/bites;Follow solids with liquid Postural Changes and/or Swallow Maneuvers: Seated upright 90 degrees;Upright 30-60 min after meal    Other  Recommendations Oral Care Recommendations: Oral care BID   Follow Up Recommendations  None    Frequency and Duration        Pertinent  Vitals/Pain none         Swallow Study         Oral/Motor/Sensory Function Overall Oral Motor/Sensory Function: Appears within functional limits for tasks assessed   Ice Chips Ice chips: Not tested   Thin Liquid Thin Liquid: Within functional limits Presentation: Straw;Cup    Nectar Thick Nectar Thick Liquid: Not tested   Honey Thick Honey Thick Liquid: Not tested   Puree Puree: Within functional limits   Solid   GO    Solid: Within functional limits       Royce Macadamia M.Ed [employer] [phone]  09/16/2012

## 2012-09-16 NOTE — Progress Notes (Signed)
PATIENT DETAILS Name: [name] Age: 79 y.o. Sex: male Date of Birth: [birth year] Admit Date: 09/13/2012 Admitting Physician Ron Parker, MD ZOX:[id number] Sherilyn Cooter, MD  Subjective: Continues to have pain-points to the right mid abd area to me  Assessment/Plan: Principal Problem: Chest pain  -likely GI related - resolved - troponin negative x3 - no acute EKG changes  -Doubt further work up needed-as this has completely resolved  Active Problems: Vomiting/ epigastric and abdominal pain/ cholelithiasis -unable to complete EGD-2/2 agitation on 5/26, -claims to have tenderness in his right mid abdomen-Dysphagia limited the UGI/SBFT/Esophagram this AM- therefore not very helpful - Continue with PPI - Will get [place] [facility] to see the patient to see if cholecystectomy is indicated  Dysphagia -unable to complete EGD-2/2 agitation on 5/26,Dysphagia limited the UGI/SBFT/Esophagram this AM- therefore not very helpful - Will get SLP evaluation  H/o CAD  -on ASA -inferior posterior myocardial infarction in May of 2000 - had stenting of the distal circumflex - repeat cardiac catheterization in 2001 demonstrated that the stent was occluded and the vessel filled by right to left collaterals - followed by Dr. Peter [place]   Chronic aortic insufficiency  -moderate by echo Dec 2013 -followed by Dr. 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 aspirin EC  81 mg Oral Daily  . doxazosin  8 mg Oral QHS  . enoxaparin (LOVENOX) injection  40 mg Subcutaneous Q24H  . finasteride  5 mg Oral QHS  . pantoprazole  40 mg Oral Q0600  . tiotropium  18 mcg Inhalation Daily   Continuous Infusions: . sodium chloride 75 mL/hr at 09/16/12 0538   PRN Meds:.acetaminophen, acetaminophen, alum & mag hydroxide-simeth, HYDROmorphone (DILAUDID) injection, LORazepam, nitroGLYCERIN, ondansetron (ZOFRAN) IV, ondansetron, oxyCODONE, polyethylene glycol, promethazine, senna, zolpidem  Antibiotics: Anti-infectives   None       PHYSICAL EXAM: Vital signs in last 24 hours: Filed Vitals:   09/15/12 2109 09/16/12 0201 09/16/12 0506 09/16/12 1036  BP: 114/71 145/69 129/74 139/69  Pulse: 104 90 97 95  Temp: 100.4 F (38 C) 99.5 F (37.5 C) 98.7 F (37.1 C) 98.5 F (36.9 C)  TempSrc: Oral Oral Oral Oral  Resp: 16 18 18 18   Height:      Weight:      SpO2: 92% 94% 92% 96%    Weight change:  Filed Weights   09/14/12 0330 09/15/12 1740  Weight: 96.5 kg (212 lb 11.9 oz) 96.9 kg (213 lb 10 oz)   Body mass index is 28.19 kg/(m^2).   Gen Exam: Awake and alert with clear speech.   Neck: Supple, No JVD.   Chest: B/L Clear.   CVS: S1 S2 Regular,  no murmurs. Abdomen: soft, BS +, non distended. Mildly tender in the upper abdominal area diffusely without rebound. Extremities: no edema, lower extremities warm to touch. Neurologic: Non Focal.   Skin: No Rash.   Wounds: N/A.    Intake/Output from previous day:  Intake/Output Summary (Last 24 hours) at 09/16/12 1121 Last data filed at 09/16/12 0551  Gross per 24 hour  Intake    975 ml  Output    625  ml  Net    350 ml     LAB RESULTS: CBC  Recent Labs Lab 09/13/12 2129 09/13/12 2218 09/14/12 0545 09/15/12 0550 09/16/12 0435  WBC 10.0  --  12.6* 14.2* 12.4*  HGB 15.1 15.0 15.5 14.3 13.8  HCT 43.0 44.0 44.7 42.3 40.9  PLT 115*  --  109* 88* 85*  MCV 85.7  --  87.0 88.3 89.3  MCH 30.1  --  30.2 29.9 30.1  MCHC 35.1  --  34.7 33.8 33.7  RDW 13.9  --  13.9 14.2 14.1    Chemistries   Recent Labs Lab 09/13/12 2218 09/14/12 0545 09/14/12 1406 09/16/12 0435  NA 140 137 134* 136  K 4.0 4.5 4.5 4.3  CL 108 103 103 106  CO2  --  27 21 21   GLUCOSE 139* 139* 116* 107*  BUN 20 17 15 22   CREATININE 1.20 1.16 1.00 1.17  CALCIUM  --  9.4 9.0 8.6    CBG: No results found for this basename: GLUCAP,  in the last 168 hours  GFR Estimated Creatinine Clearance: 62.8 ml/min (by C-G formula based on Cr of 1.17).  Coagulation profile No results found for this basename: INR, PROTIME,  in the last 168 hours  Cardiac Enzymes  Recent Labs Lab 09/14/12 0245 09/14/12 0900 09/14/12 1406  TROPONINI <0.30 <0.30 <0.30    No components found with this basename: POCBNP,  No results found for this basename: DDIMER,  in the last 72 hours No results found for this basename: HGBA1C,  in the last 72 hours No results found for this basename: CHOL, HDL, LDLCALC, TRIG, CHOLHDL, LDLDIRECT,  in the last 72 hours No results found for this basename: TSH, T4TOTAL, FREET3, T3FREE, THYROIDAB,  in the last 72 hours No results found for this basename: VITAMINB12, FOLATE, FERRITIN, TIBC, IRON, RETICCTPCT,  in the last 72 hours  Recent Labs  09/15/12 0550  LIPASE 10*    Urine Studies No results found for this basename: UACOL, UAPR, USPG, UPH, UTP, UGL, UKET, UBIL, UHGB, UNIT, UROB, ULEU, UEPI, UWBC, URBC, UBAC, CAST, CRYS, UCOM, BILUA,  in the last 72 hours  MICROBIOLOGY: Recent Results (from the past 240 hour(s))  MRSA PCR SCREENING     Status: None   Collection Time    09/14/12  3:35 AM       Result Value Range Status   MRSA by PCR NEGATIVE  NEGATIVE Final   Comment:            The GeneXpert MRSA Assay (FDA     approved for NASAL specimens     only), is one component of a     comprehensive MRSA colonization     surveillance program. It is not     intended to diagnose MRSA     infection nor to guide or     monitor treatment for     MRSA infections.    RADIOLOGY STUDIES/RESULTS: Dg Chest 2 View  09/13/2012   *RADIOLOGY REPORT*  Clinical Data: Chest pain  CHEST - 2 VIEW  Comparison: 06/16/2010  Findings: Aorta is ectatic and unfolded.  Cardiac leads are in place.  Mild cardiomegaly noted with central vascular congestion but no overt edema.  No pleural effusion.  No acute osseous finding.  IMPRESSION: Cardiomegaly with aortic ectasia but no focal acute finding.   Original Report Authenticated By: Christiana Pellant, M.D.   US Abdomen Complete  09/14/2012   *RADIOLOGY REPORT*  Clinical Data:  Cholelithiasis, elevated LFT  COMPLETE ABDOMINAL ULTRASOUND  Comparison:  CT 09/13/2012  Findings:  Gallbladder:  There are multiple small gallstones within the gallbladder lumen.  One larger mobile gallstone measures 1.5 cm. The gallbladder wall is mildly thickened 5 mm.  Negative sonographic Murphy's sign.  No pericholecystic fluid.  Common bile duct:  Dilated at 9 mm.  Liver:  No focal lesion identified.  Within normal limits in parenchymal echogenicity.  IVC:  Appears normal.  Pancreas:  No focal abnormality seen.  Spleen:  Normal in size and echogenicity  Right Kidney:  10.7cm in length.  There is a 2.5 cm cyst in the lower pole which appears benign.  Left Kidney:  9.6cm in length.  No evidence of hydronephrosis or stones.  Abdominal aorta:  No aneurysm identified.  IMPRESSION:  1.  Multiple gallstones with thickened gallbladder wall may represent chronic cholecystitis.  No sonographic Murphy's sign to suggest acute cholecystitis.  Recommend clinical correlation. 2.  Dilatation of the common  bile duct.  If  patient has abnormal liver function test, consider MRCP to exclude distal obstructing stone.   Original Report Authenticated By: Genevive Bi, M.D.   Dg Esophagus  09/16/2012   *RADIOLOGY REPORT*  Clinical Data:Difficulty swallowing.  Endoscopy was attempted but the scope could not be passed.  ESOPHAGUS/BARIUM SWALLOW/TABLET STUDY  Technique: Upper GI series performed with high density barium and effervescent agent. Thin barium also used.  Subsequently, serial images of the small bowel were obtained including spot views of the terminal ileum.  Fluoroscopy Time: 2 minutes, 26 seconds.  Comparison: CT abdomen pelvis 09/13/2012 and PA and lateral chest 09/13/2012.  Findings: The study was originally ordered as an upper GI and small- bowel follow-through.  That study could not be performed due to due to difficulty the patient has swallowing.  No Zenker's diverticulum is identified.  The esophagus appeared grossly normal without stricture or obvious mass.  The stomach could not be filled with contrast.  Very limited visualization of the duodenum was unremarkable.  IMPRESSION: Extremely limited study demonstrates no gross abnormality of the esophagus.  No Zenker's diverticulum is seen.   Original Report Authenticated By: Holley Dexter, M.D.   Ct Angio Abd/pel W/ And/or W/o  09/13/2012   *RADIOLOGY REPORT*  Clinical Data: Abdominal pain, abdominal aortic aneurysm with previous repair.  CT ANGIOGRAPHY ABDOMEN AND PELVIS WITH CONTRAST AND WITHOUT CONTRAST  Comparison: 09/12/2011  Findings: There is no other changes are noted at the lung bases with curvilinear subpleural scarring or atelectasis.  Noncontrast images demonstrate evidence of previous infrarenal abdominal aortic aneurysm repair with stable proximal aortic ectasia measuring 4.7 cm image 28.  No hyperdense intramural hematoma.  Minimal stranding about the aorta is likely postoperative and stable.  Gallstone noted without other CT  evidence for acute cholecystitis. Areas of left renal cortical scarring and cortical atrophy. Nonobstructing 1 mm left renal calculi are noted, for example image 36.  No radiopaque ureteral calculus.  Layering dependent bladder calculi are re-identified.  After administration of contrast, hypodense lesion.  Atheromatous aortic plaque  is redemonstrated.  No dissection flap is identified. Stable bilateral common iliac arterial ectasia, 1.7 cm on the left image 139 and 1.6 cm on the right image 142.  Streak artifact from the patient's arms is noted.  Liver, adrenal glands, spleen, pancreas are normal.  Right lower renal pole cyst is stable.  Radiopaque object within the colon are most likely ingested pill fragment.  No ascites, free air, or lymphadenopathy.  Fat containing ventral hernias are redemonstrated.  Normal appendix. No bowel wall thickening or focal segmental dilatation.  Multilevel disc degenerative change is noted.  Schmorl's node formation at multiple levels including L2-L3 again noted.  IMPRESSION: No acute intra-abdominal or pelvic pathology.  Specifically, stable evidence of aortic aneurysm repair without dissection or evidence for rupture.  Gallstones and left renal calculi with bladder calculi redemonstrated.   Original Report Authenticated By: Christiana Pellant, M.D.    Jeoffrey Massed, MD  [facility] Pager:[phone]  If 7PM-7AM, please contact night-coverage [email or web address] Password [email or web address] 09/16/2012, 11:21 AM   LOS: 3 days

## 2012-09-16 NOTE — Progress Notes (Signed)
Informed by RN that patient and wife want to be discharged, apparently had just seen SLP and Dr Larae Grooms, patient claims that he does not like the "hospital bed" and cannot stay here any longer. He still has abdominal pain and dysphagia, and work up is not complete yet. I have asked the patient and wife to reconsider and stay, and have told then that if he wants to leave, he will have to sign out against medical advice.  Patientt has been warned that this is not Medically advisable at this time, and can result in Medical complications like Death and Disability, patient understands and accepts the risks involved and assumes full responsibilty of this decision.

## 2012-09-16 NOTE — Discharge Summary (Signed)
PATIENT DETAILS Name: [name] Age: 79 y.o. Sex: male Date of Birth: [birth year] MRN: [id number]. Admit Date: 09/13/2012 Admitting Physician: Ron Parker, MD NWG:[id number] Sherilyn Cooter, MD  Patient signed out AMA. Patient and spouse at bedside were warned that this is not Medically advisable at this time, and can result in Medical complications like Death and Disability, patient understands and accepts the risks involved and assumes full responsibilty of this decision. RN Rosalie Doctor was in the room with me when patient was advised against leaving the hospital.  PRIMARY DISCHARGE DIAGNOSIS:  Principal Problem:   Chest pain Active Problems:   HYPERTENSION   CORONARY ARTERY DISEASE   COPD (chronic obstructive pulmonary disease)   Chronic systolic CHF (congestive heart failure)- EF 25-30%   Abdominal pain, epigastric   Cholelithiasis   Nausea and vomiting   Thrombocytopenia, unspecified   Abdominal pain, right upper quadrant   Nonspecific (abnormal) findings on radiological and other examination of biliary tract      PAST MEDICAL HISTORY: Past Medical History  Diagnosis Date  . Kidney tumor (benign)   . Chronic headache   . Hypertension   . Nephrolithiasis   . Renal insufficiency   . Peripheral vascular disease   . BPH (benign prostatic hypertrophy)   . CAD (coronary artery disease)   . Detached retina   . Asthma   . Allergic rhinitis   . Sleep apnea   . Pulmonary fibrosis Jan 2009    very subtle possible fibrosis noted on CT Jan 2009.  Unchanged since Oct 2006. Deemed possibly due to chlorine exposure at gym work swimming pool  . COPD (chronic obstructive pulmonary disease)     2001 PFTs  - FEv1 2.5L/66%, DLCO 57% -> March 2009: Fev1 1.91L/625, Ratio 50, DLCO 50%  . AAA (abdominal aortic aneurysm)     Rupture 16 years ago  . History of colonic diverticulitis   . Aneurysm, common iliac artery   . Myocardial infarction     minor in 2000, 2001  . Aortic  insufficiency   . Chronic systolic CHF (congestive heart failure)     DISCHARGE MEDICATIONS: Patient signed out AMA-discharge medications were not given as patient did not stay for proper discharge instructions.Patient' spouse did indicate that he will take medications prior to admission.  ALLERGIES:   Allergies  Allergen Reactions  . Beta Adrenergic Blockers Other (See Comments)    Difficulty breathing    BRIEF HPI:  See H&P, Labs, Consult and Test reports for all details in brief, 79 y.o. male who presented to the ED with complaints of chest and epigastric pain along with nausea and vomiting since 5 pm on 05/24. He took a SL NTG at home without relief, and he took an 81 mg aspirin and went to the ED. He denied having any fevers, chills, or diarrhea. He also denied hematemesis. Since he has a history of an AAA, a CTA of the ABD was performed to evaluate for a possible rupture and it was found to be negative for signs of a dissection or rupture but + for gallstones.   CONSULTATIONS:   GI and general surgery  PERTINENT RADIOLOGIC STUDIES: Dg Chest 2 View  09/13/2012   *RADIOLOGY REPORT*  Clinical Data: Chest pain  CHEST - 2 VIEW  Comparison: 06/16/2010  Findings: Aorta is ectatic and unfolded.  Cardiac leads are in place.  Mild cardiomegaly noted with central vascular congestion but no overt edema.  No pleural effusion.  No acute osseous finding.  IMPRESSION: Cardiomegaly with aortic ectasia but no focal acute finding.   Original Report Authenticated By: Christiana Pellant, M.D.   Dg Abd 1 View  09/16/2012   *RADIOLOGY REPORT*  Clinical Data: Abdominal pain.  Difficulty swallowing.  ABDOMEN - 1 VIEW  Comparison: CT abdomen and pelvis 09/13/2012.  Findings: No evidence of bowel obstruction is identified.  There is a fairly large volume of stool in the ascending colon.  No focal bony abnormality is identified with convex right scoliosis noted.  IMPRESSION: No acute finding.  Large volume of stool  ascending colon.   Original Report Authenticated By: Holley Dexter, M.D.   US Abdomen Complete  09/14/2012   *RADIOLOGY REPORT*  Clinical Data:  Cholelithiasis, elevated LFT  COMPLETE ABDOMINAL ULTRASOUND  Comparison:  CT 09/13/2012  Findings:  Gallbladder:  There are multiple small gallstones within the gallbladder lumen.  One larger mobile gallstone measures 1.5 cm. The gallbladder wall is mildly thickened 5 mm.  Negative sonographic Murphy's sign.  No pericholecystic fluid.  Common bile duct:  Dilated at 9 mm.  Liver:  No focal lesion identified.  Within normal limits in parenchymal echogenicity.  IVC:  Appears normal.  Pancreas:  No focal abnormality seen.  Spleen:  Normal in size and echogenicity  Right Kidney:  10.7cm in length.  There is a 2.5 cm cyst in the lower pole which appears benign.  Left Kidney:  9.6cm in length.  No evidence of hydronephrosis or stones.  Abdominal aorta:  No aneurysm identified.  IMPRESSION:  1.  Multiple gallstones with thickened gallbladder wall may represent chronic cholecystitis.  No sonographic Murphy's sign to suggest acute cholecystitis.  Recommend clinical correlation. 2.  Dilatation of the common bile duct.  If  patient has abnormal liver function test, consider MRCP to exclude distal obstructing stone.   Original Report Authenticated By: Genevive Bi, M.D.   Dg Esophagus  09/16/2012   *RADIOLOGY REPORT*  Clinical Data:Difficulty swallowing.  Endoscopy was attempted but the scope could not be passed.  ESOPHAGUS/BARIUM SWALLOW/TABLET STUDY  Technique: Upper GI series performed with high density barium and effervescent agent. Thin barium also used.  Subsequently, serial images of the small bowel were obtained including spot views of the terminal ileum.  Fluoroscopy Time: 2 minutes, 26 seconds.  Comparison: CT abdomen pelvis 09/13/2012 and PA and lateral chest 09/13/2012.  Findings: The study was originally ordered as an upper GI and small- bowel follow-through.   That study could not be performed due to due to difficulty the patient has swallowing.  No Zenker's diverticulum is identified.  The esophagus appeared grossly normal without stricture or obvious mass.  The stomach could not be filled with contrast.  Very limited visualization of the duodenum was unremarkable.  IMPRESSION: Extremely limited study demonstrates no gross abnormality of the esophagus.  No Zenker's diverticulum is seen.   Original Report Authenticated By: Holley Dexter, M.D.   Ct Angio Abd/pel W/ And/or W/o  09/13/2012   *RADIOLOGY REPORT*  Clinical Data: Abdominal pain, abdominal aortic aneurysm with previous repair.  CT ANGIOGRAPHY ABDOMEN AND PELVIS WITH CONTRAST AND WITHOUT CONTRAST  Comparison: 09/12/2011  Findings: There is no other changes are noted at the lung bases with curvilinear subpleural scarring or atelectasis.  Noncontrast images demonstrate evidence of previous infrarenal abdominal aortic aneurysm repair with stable proximal aortic ectasia measuring 4.7 cm image 28.  No hyperdense intramural hematoma.  Minimal stranding about the aorta is likely postoperative and stable.  Gallstone noted without other CT evidence for acute  cholecystitis. Areas of left renal cortical scarring and cortical atrophy. Nonobstructing 1 mm left renal calculi are noted, for example image 36.  No radiopaque ureteral calculus.  Layering dependent bladder calculi are re-identified.  After administration of contrast, hypodense lesion.  Atheromatous aortic plaque is redemonstrated.  No dissection flap is identified. Stable bilateral common iliac arterial ectasia, 1.7 cm on the left image 139 and 1.6 cm on the right image 142.  Streak artifact from the patient's arms is noted.  Liver, adrenal glands, spleen, pancreas are normal.  Right lower renal pole cyst is stable.  Radiopaque object within the colon are most likely ingested pill fragment.  No ascites, free air, or lymphadenopathy.  Fat containing ventral  hernias are redemonstrated.  Normal appendix. No bowel wall thickening or focal segmental dilatation.  Multilevel disc degenerative change is noted.  Schmorl's node formation at multiple levels including L2-L3 again noted.  IMPRESSION: No acute intra-abdominal or pelvic pathology.  Specifically, stable evidence of aortic aneurysm repair without dissection or evidence for rupture.  Gallstones and left renal calculi with bladder calculi redemonstrated.   Original Report Authenticated By: Christiana Pellant, M.D.     PERTINENT LAB RESULTS: CBC:  Recent Labs  09/15/12 0550 09/16/12 0435  WBC 14.2* 12.4*  HGB 14.3 13.8  HCT 42.3 40.9  PLT 88* 85*   CMET CMP     Component Value Date/Time   NA 136 09/16/2012 0435   K 4.3 09/16/2012 0435   CL 106 09/16/2012 0435   CO2 21 09/16/2012 0435   GLUCOSE 107* 09/16/2012 0435   BUN 22 09/16/2012 0435   CREATININE 1.17 09/16/2012 0435   CALCIUM 8.6 09/16/2012 0435   PROT 5.6* 09/16/2012 0435   ALBUMIN 2.6* 09/16/2012 0435   AST 14 09/16/2012 0435   ALT 13 09/16/2012 0435   ALKPHOS 39 09/16/2012 0435   BILITOT 1.0 09/16/2012 0435   GFRNONAA 57* 09/16/2012 0435   GFRAA 67* 09/16/2012 0435    GFR Estimated Creatinine Clearance: 62.8 ml/min (by C-G formula based on Cr of 1.17).  Recent Labs  09/15/12 0550  LIPASE 10*    Recent Labs  09/14/12 0245 09/14/12 0900 09/14/12 1406  TROPONINI <0.30 <0.30 <0.30   No components found with this basename: POCBNP,  No results found for this basename: DDIMER,  in the last 72 hours No results found for this basename: HGBA1C,  in the last 72 hours No results found for this basename: CHOL, HDL, LDLCALC, TRIG, CHOLHDL, LDLDIRECT,  in the last 72 hours No results found for this basename: TSH, T4TOTAL, FREET3, T3FREE, THYROIDAB,  in the last 72 hours No results found for this basename: VITAMINB12, FOLATE, FERRITIN, TIBC, IRON, RETICCTPCT,  in the last 72 hours Coags: No results found for this basename: PT, INR,  in  the last 72 hours Microbiology: Recent Results (from the past 240 hour(s))  MRSA PCR SCREENING     Status: None   Collection Time    09/14/12  3:35 AM      Result Value Range Status   MRSA by PCR NEGATIVE  NEGATIVE Final   Comment:            The GeneXpert MRSA Assay (FDA     approved for NASAL specimens     only), is one component of a     comprehensive MRSA colonization     surveillance program. It is not     intended to diagnose MRSA     infection nor to guide or  monitor treatment for     MRSA infections.     BRIEF HOSPITAL COURSE:   Principal Problem:  Patient was admitted in the SDU for chest pain and abdominal pain.He also had associated Dysphagia. Cardiac enzymes were negative, GI was consulted-EGD was attempted on 5/26 however this was aborted as patient became agitated and pulled out the scope. Apparently this has happened in prior occasions as well. He was moved out of the SDU and to the floor. I saw him for the very first time today, his spouse was present at bedside. He was requesting to be discharged, however he still was not medically ready and needed further work up, he did not want to stay another night and sleep in the "hospital bed". GI did follow up today-and did offer to repeat EGD, however he left AMA-despite being asked to stay and complete work up. Spouse was at bedside and supported patient's decision. I did warn both of them of life threatening and disabling effects of leaving the hospital when not medically ready, they understood and accepted full responsibility and subsequently left.    TODAY-DAY OF DISCHARGE:  Subjective:   [name] today has signed out against medical advice.  Objective:   Blood pressure 139/69, pulse 95, temperature 98.5 F (36.9 C), temperature source Oral, resp. rate 18, height 6\' 1"  (1.854 m), weight 96.9 kg (213 lb 10 oz), SpO2 96.00%.  Intake/Output Summary (Last 24 hours) at 09/16/12 1639 Last data filed at 09/16/12  0551  Gross per 24 hour  Intake    750 ml  Output    525 ml  Net    225 ml   Filed Weights   09/14/12 0330 09/15/12 1740  Weight: 96.5 kg (212 lb 11.9 oz) 96.9 kg (213 lb 10 oz)    Exam Awake Alert, Oriented *3, No new F.N deficits, Normal affect [place].AT,PERRAL Supple Neck,No JVD, No cervical lymphadenopathy appriciated.  Symmetrical Chest wall movement, Good air movement bilaterally, CTAB RRR,No Gallops,Rubs or new Murmurs, No Parasternal Heave +ve B.Sounds, Abd Soft, No organomegaly appriciated, No rebound -guarding or rigidity.Tender in the mid right abd area. No Cyanosis, Clubbing or edema, No new Rash or bruise  DISCHARGE CONDITION: Stable  DISPOSITION: Home       Total Time spent on discharge equals 45 minutes.  SignedJeoffrey Massed 09/16/2012 4:39 PM

## 2012-09-16 NOTE — Progress Notes (Signed)
Pt to leave AMA, MD aware. IV d/c.

## 2012-09-17 ENCOUNTER — Encounter (HOSPITAL_COMMUNITY): Payer: Self-pay | Admitting: Internal Medicine

## 2012-09-23 ENCOUNTER — Other Ambulatory Visit (INDEPENDENT_AMBULATORY_CARE_PROVIDER_SITE_OTHER): Payer: Medicare PPO

## 2012-09-23 ENCOUNTER — Ambulatory Visit (INDEPENDENT_AMBULATORY_CARE_PROVIDER_SITE_OTHER): Payer: Medicare PPO | Admitting: Internal Medicine

## 2012-09-23 ENCOUNTER — Encounter: Payer: Self-pay | Admitting: Internal Medicine

## 2012-09-23 VITALS — BP 142/78 | HR 77 | Temp 97.9°F | Ht 73.0 in | Wt 206.0 lb

## 2012-09-23 DIAGNOSIS — N400 Enlarged prostate without lower urinary tract symptoms: Secondary | ICD-10-CM

## 2012-09-23 DIAGNOSIS — R300 Dysuria: Secondary | ICD-10-CM

## 2012-09-23 DIAGNOSIS — R3911 Hesitancy of micturition: Secondary | ICD-10-CM

## 2012-09-23 DIAGNOSIS — IMO0001 Reserved for inherently not codable concepts without codable children: Secondary | ICD-10-CM

## 2012-09-23 DIAGNOSIS — R350 Frequency of micturition: Secondary | ICD-10-CM

## 2012-09-23 LAB — BASIC METABOLIC PANEL
Chloride: 108 mEq/L (ref 96–112)
GFR: 54.53 mL/min — ABNORMAL LOW (ref >60.00)
Potassium: 5.1 mEq/L (ref 3.5–5.1)

## 2012-09-23 LAB — CBC
MCHC: 33.4 g/dL (ref 30.0–36.0)
MCV: 90.6 fl (ref 78.0–100.0)
RDW: 14.6 % (ref 11.5–14.6)

## 2012-09-23 LAB — POCT URINALYSIS DIPSTICK
Leukocytes, UA: NEGATIVE
Nitrite, UA: NEGATIVE
Urobilinogen, UA: 2
pH, UA: 6

## 2012-09-23 LAB — PSA, MEDICARE: PSA: 2.97 ng/ml (ref 0.10–4.00)

## 2012-09-23 NOTE — Patient Instructions (Signed)
Benign Prostatic Hypertrophy  The prostate gland is part of the reproductive system of men. A normal prostate is about the size and shape of a walnut. The prostate gland makes a fluid that is mixed with sperm to make semen. This gland surrounds the urethra and is located in front of the rectum and just below the bladder. The bladder is where urine is stored. The urethra is the tube through which urine passes from the bladder to get out of the body. The prostate grows as a man ages. An enlarged prostate not caused by cancer is called benign prostatic hypertrophy (BPH). This is a common health problem in men over age 50. This condition is a normal part of aging. An enlarged prostate presses on the urethra. This makes it harder to pass urine. In the early stages of enlargement, the bladder can get by with a narrowed urethra by forcing the urine through. If the problem gets worse, medical or surgical treatment may be required.  This condition should be followed by your caregiver. Longstanding back pressure on the kidneys can cause infection. Back pressure and infection can progress to bladder damage and kidney (renal) failure. If needed, your caregiver may refer you to a specialist in kidney and prostate disease (urologist). CAUSES  The exact cause is not known.  SYMPTOMS   You are not able to completely empty your bladder.  Getting up often during the night to urinate.  Need to urinate frequently during the day.  Difficultly in starting urine flow.  Decrease in size and strength of the urine stream.  Dribbling after urination.  Pain on urination (more common with infection).  Inability to pass your water. This needs immediate treatment. DIAGNOSIS  These tests will help your caregiver understand your problem:  Digital rectal exam (DRE). In a rectal exam, your caregiver checks your prostate by putting a gloved, lubricated finger into the rectum to feel the back of your prostate gland. This exam  detects the size of the gland and abnormal lumps or growths.  Urinalysis (exam of the urine). This may include a culture if there is concern about infection.  Prostate Specific Antigen (PSA). This is a blood test used to screen for prostate cancer. It is not used alone for diagnosing prostate cancer.  Rectal ultrasound (sonogram). This test uses sound waves to electronically produce a "picture" of the prostate. It helps examine the prostate gland for cancer. TREATMENT  Mild symptoms may not need treatment. Simple observation and yearly exams may be all that is required. Medications and surgery are options for more severe problems. Your caregiver can help you make an informed decision for what is best. Two classes of medications are available for relief of prostate symptoms:  Medications that shrink the prostate. This helps relieve symptoms.  Uncommon side effects include problems with sexual function.  Medications to relax the muscle of the prostate. This also relieves the obstruction.  Side effects can include dizziness, fatigue, lightheadedness, and retrograde ejaculation (diminished volume of ejaculate). Several types of surgical treatments are available for relief of prostate symptoms:  Transurethral resection of the prostate (TURP). In this treatment, an instrument is inserted through opening at the tip of the penis. It is used to cut away pieces of the inner core of the prostate. The pieces are removed through the same opening of the penis. This removes the obstruction and helps get rid of the symptoms.  Transurethral incision (TUIP). In this procedure, small cuts are made in the prostate. This   lessens the prostates pressure on the urethra.  Transurethral microwave thermotherapy (TUMT). This procedure uses microwaves to create heat. The heat destroys and removes a small amount of prostate tissue.  Transurethral needle ablation (TUNA). This is a procedure that uses radio frequencies to  do the same as TUMT.  Interstitial laser coagulation (ILC). This is a procedure that uses a laser to do the same as TUMT and TUNA.  Transurethral electrovaporization (TUVP). This is a procedure that uses electrodes to do the same as the procedures listed above. Regardless of the method of treatment chosen, you and your caregiver will discuss the options. With this knowledge, you along with your caregiver can decide upon the best treatment for you. SEEK MEDICAL CARE IF:   You develop chills, fever of 100.5 F (38.1 C), or night sweats.  There is unexplained back pain.  Symptoms are not helped by medications prescribed.  You develop medication side effects.  Your urine becomes very dark or has a bad smell. SEEK IMMEDIATE MEDICAL CARE IF:   You are suddenly unable to urinate. This is an emergency. You should be seen immediately.  There are large amounts of blood or clots in the urine.  Your urinary problems become unmanageable.  You develop lightheadedness, severe dizziness, or you feel faint.  You develop moderate to severe low back or flank pain.  You develop chills or fever. Document Released: 04/09/2005 Document Revised: 07/02/2011 Document Reviewed: 12/30/2006 [employer] Patient Information 2014 [employer].  

## 2012-09-23 NOTE — Progress Notes (Signed)
HPI  Pt presents to the clinic today with c/o urgency, frequency and pain with urination. This started 1 week ago. He does have a history of BPH and is currently taking med for this. He denies fever, chills or blood in his urine but he has had bilateral flank pain. He has no history of kidney stones. He is scheduled to see a urologist this Friday.   Review of Systems  Past Medical History  Diagnosis Date  . Kidney tumor (benign)   . Chronic headache   . Hypertension   . Nephrolithiasis   . Renal insufficiency   . Peripheral vascular disease   . BPH (benign prostatic hypertrophy)   . CAD (coronary artery disease)   . Detached retina   . Asthma   . Allergic rhinitis   . Sleep apnea   . Pulmonary fibrosis Jan 2009    very subtle possible fibrosis noted on CT Jan 2009.  Unchanged since Oct 2006. Deemed possibly due to chlorine exposure at gym work swimming pool  . COPD (chronic obstructive pulmonary disease)     2001 PFTs  - FEv1 2.5L/66%, DLCO 57% -> March 2009: Fev1 1.91L/625, Ratio 50, DLCO 50%  . AAA (abdominal aortic aneurysm)     Rupture 16 years ago  . History of colonic diverticulitis   . Aneurysm, common iliac artery   . Myocardial infarction     minor in 2000, 2001  . Aortic insufficiency   . Chronic systolic CHF (congestive heart failure)     Family History  Problem Relation Age of Onset  . Aneurysm Father 10    deceased  . Other Father     AAA  . Hypertension Mother   . Other Mother     amputation (leg)    History   Social History  . Marital Status: Married    Spouse Name: N/A    Number of Children: N/A  . Years of Education: N/A   Occupational History  . 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 Smoking status: Former Smoker -- 1.00 packs/day for 50 years    Types: Cigarettes    Quit date: 04/23/1997  . Smokeless tobacco: Never Used  . Alcohol Use: No  . Drug Use: No  . Sexually Active: Not on file   Other Topics  Concern  . Not on file   Social History Narrative   Pt states had chlorine exposure x 35 yrs.     Allergies  Allergen Reactions  . Beta Adrenergic Blockers Other (See Comments)    Difficulty breathing    Constitutional: Denies fever, malaise, fatigue, headache or abrupt weight changes.   GU: Pt reports urgency, frequency and pain with urination. Denies burning sensation, blood in urine, odor or discharge. Skin: Denies redness, rashes, lesions or ulcercations.   No other specific complaints in a complete review of systems (except as listed in HPI above).    Objective:   Physical Exam  BP 142/78  Pulse 77  Temp(Src) 97.9 F (36.6 C) (Oral)  Ht 6\' 1"  (1.854 m)  Wt 206 lb (93.441 kg)  BMI 27.18 kg/m2  SpO2 95% Wt Readings from Last 3 Encounters:  09/23/12 206 lb (93.441 kg)  09/15/12 213 lb 10 oz (96.9 kg)  09/15/12 213 lb 10 oz (96.9 kg)    General: Appears his stated age, well developed, well nourished in NAD. Cardiovascular: Normal rate and rhythm. S1,S2 noted.  No murmur,  rubs or gallops noted. No JVD or BLE edema. No carotid bruits noted. Pulmonary/Chest: Normal effort and positive vesicular breath sounds. No respiratory distress. No wheezes, rales or ronchi noted.  Abdomen: Soft and nontender. Normal bowel sounds, no bruits noted. No distention or masses noted. Liver, spleen and kidneys non palpable. Tender to palpation over the bladder area. No CVA tenderness.      Assessment & Plan:   Hesitancy and dysuria, likely due to cystitis  Will obtain urinalysis Will check labs and PSA Keep your appointment with urology for this friday  RTC as needed or if symptoms persist.

## 2012-09-25 ENCOUNTER — Other Ambulatory Visit: Payer: Self-pay | Admitting: Internal Medicine

## 2012-10-20 ENCOUNTER — Encounter: Payer: Self-pay | Admitting: Family Medicine

## 2012-10-20 ENCOUNTER — Ambulatory Visit (INDEPENDENT_AMBULATORY_CARE_PROVIDER_SITE_OTHER): Payer: Medicare PPO | Admitting: Family Medicine

## 2012-10-20 VITALS — BP 140/70 | Temp 97.6°F | Wt 208.0 lb

## 2012-10-20 DIAGNOSIS — R300 Dysuria: Secondary | ICD-10-CM

## 2012-10-20 DIAGNOSIS — N390 Urinary tract infection, site not specified: Secondary | ICD-10-CM

## 2012-10-20 LAB — POCT URINALYSIS DIPSTICK
Bilirubin, UA: NEGATIVE
Ketones, UA: NEGATIVE
Nitrite, UA: POSITIVE
pH, UA: 6

## 2012-10-20 MED ORDER — CIPROFLOXACIN HCL 500 MG PO TABS: 500.0000 mg | ORAL_TABLET | Freq: Two times a day (BID) | ORAL | Status: DC

## 2012-10-20 NOTE — Patient Instructions (Signed)
-  As we discussed, we have prescribed a new medication (ciprofloxacin for urine infection) for you at this appointment. We discussed the common and serious potential adverse effects of this medication and you can review these and more with the pharmacist when you pick up your medication.  Please follow the instructions for use carefully and notify us immediately if you have any problems taking this medication.  -please schedule a follow up appointment with your urologist this week  -please see a doctor immediately if abdominal pain returns, worsening or feel sick

## 2012-10-20 NOTE — Progress Notes (Signed)
Chief Complaint  Patient presents with  . Dysuria    HPI:  79 yo M pt of Dr. Cato Mulligan here for acute visit for dysuria: -reports hx of bph, recurrent UTIs, partial kidney resection and hx kidney stones very remotely and dysuria followed by urology whom he recently saw -current symptoms started: about 3-4 days ago -symptoms: burning with urination, urgency, frequency -denies: fevers, NVD, new back pain (reports chronic back pain), abd pain (other then a little bit earlier today in R mid abd - took laxative yesterday and had increased BM this morning) flank pain, rectal pain, blood in urine   ROS: See pertinent positives and negatives per HPI.  Past Medical History  Diagnosis Date  . Kidney tumor (benign)   . Chronic headache   . Hypertension   . Nephrolithiasis   . Renal insufficiency   . Peripheral vascular disease   . BPH (benign prostatic hypertrophy)   . CAD (coronary artery disease)   . Detached retina   . Asthma   . Allergic rhinitis   . Sleep apnea   . Pulmonary fibrosis Jan 2009    very subtle possible fibrosis noted on CT Jan 2009.  Unchanged since Oct 2006. Deemed possibly due to chlorine exposure at gym work swimming pool  . COPD (chronic obstructive pulmonary disease)     2001 PFTs  - FEv1 2.5L/66%, DLCO 57% -> March 2009: Fev1 1.91L/625, Ratio 50, DLCO 50%  . AAA (abdominal aortic aneurysm)     Rupture 16 years ago  . History of colonic diverticulitis   . Aneurysm, common iliac artery   . Myocardial infarction     minor in 2000, 2001  . Aortic insufficiency   . Chronic systolic CHF (congestive heart failure)     Family History  Problem Relation Age of Onset  . Aneurysm Father 4    deceased  . Other Father     AAA  . Hypertension Mother   . Other Mother     amputation (leg)    History   Social History  . Marital Status: Married    Spouse Name: N/A    Number of Children: N/A  . Years of Education: N/A   Occupational History  . [employer] Care    works 2 days per week   Social History Main Topics  . Smoking status: Former Smoker -- 1.00 packs/day for 50 years    Types: Cigarettes    Quit date: 04/23/1997  . Smokeless tobacco: Never Used  . Alcohol Use: No  . Drug Use: No  . Sexually Active: None   Other Topics Concern  . None   Social History Narrative   Pt states had chlorine exposure x 35 yrs.     Current outpatient prescriptions:acetaminophen (TYLENOL) 325 MG tablet, Take 650 mg by mouth every 6 (six) hours as needed for pain., Disp: , Rfl: ;  aspirin 81 MG tablet, Take 81 mg by mouth daily.  , Disp: , Rfl: ;  ciprofloxacin (CIPRO) 500 MG tablet, Take 1 tablet (500 mg total) by mouth 2 (two) times daily., Disp: 14 tablet, Rfl: 0;  doxazosin (CARDURA) 8 MG tablet, Take 8 mg by mouth at bedtime., Disp: , Rfl:  finasteride (PROSCAR) 5 MG tablet, TAKE 1 TABLET (5 MG TOTAL) DAILY., Disp: 30 tablet, Rfl: 0;  HYDROcodone-acetaminophen (NORCO) 5-325 MG per tablet, Take 1 tablet by mouth daily as needed for pain., Disp: 20 tablet, Rfl: 0;  ibuprofen (ADVIL,MOTRIN) 800 MG tablet, Take 800 mg by mouth  every 8 (eight) hours as needed. , Disp: , Rfl: ;  lisinopril (PRINIVIL,ZESTRIL) 40 MG tablet, Take 1 tablet (40 mg total) by mouth daily., Disp: 90 tablet, Rfl: 3 LORazepam (ATIVAN) 1 MG tablet, Take 1 mg by mouth daily as needed for anxiety., Disp: , Rfl: ;  nitroGLYCERIN (NITROSTAT) 0.4 MG SL tablet, Place 1 tablet (0.4 mg total) under the tongue every 5 (five) minutes as needed for chest pain., Disp: 90 tablet, Rfl: 3;  omeprazole (PRILOSEC) 20 MG capsule, Take 20 mg by mouth 2 (two) times daily., Disp: , Rfl:  polyethylene glycol (MIRALAX / GLYCOLAX) packet, Take 17 g by mouth daily as needed (constipation). , Disp: , Rfl: ;  zolpidem (AMBIEN) 5 MG tablet, Take 1 tablet (5 mg total) by mouth at bedtime as needed., Disp: 30 tablet, Rfl: 1  EXAM:  Filed Vitals:   10/20/12 1339  BP: 140/70  Temp: 97.6 F (36.4 C)    Body mass index is  27.45 kg/(m^2).  GENERAL: vitals reviewed and listed above, alert, oriented, appears well hydrated and in no acute distress  HEENT: atraumatic, conjunttiva clear, no obvious abnormalities on inspection of external nose and ears  NECK: no obvious masses on inspection  LUNGS: clear to auscultation bilaterally, no wheezes, rales or rhonchi, good air movement  CV: HRRR, no peripheral edema  ABD: BS+, soft, NTTP  MS: moves all extremities without noticeable abnormality  PSYCH: pleasant and cooperative, no obvious depression or anxiety  ASSESSMENT AND PLAN:  Discussed the following assessment and plan:  Dysuria - Plan: POCT urinalysis dipstick, Culture, Urine, ciprofloxacin (CIPRO) 500 MG tablet  UTI (urinary tract infection) - Plan: ciprofloxacin (CIPRO) 500 MG tablet  -urine dip suggest UTI, he reports had some abd pain earlier today, but this has almost entirely resolved. Otherwise, feels ok and has normal vitals. Advised if abd pain returns or feeling very sick should be seen urgently given his PMH. -advised follow up with his urologist for UTI with his complicated urological history. -abx sent while waiting on culture, risks discussed. -Patient advised to return or notify a doctor immediately if symptoms worsen or persist or new concerns arise.  Patient Instructions  -As we discussed, we have prescribed a new medication (ciprofloxacin for urine infection) for you at this appointment. We discussed the common and serious potential adverse effects of this medication and you can review these and more with the pharmacist when you pick up your medication.  Please follow the instructions for use carefully and notify us immediately if you have any problems taking this medication.  -please schedule a follow up appointment with your urologist this week  -please see a doctor immediately if abdominal pain returns, worsening or feel sick      [name], [name]

## 2012-10-23 LAB — URINE CULTURE

## 2012-11-11 ENCOUNTER — Telehealth: Payer: Self-pay | Admitting: Internal Medicine

## 2012-11-11 MED ORDER — LORAZEPAM 1 MG PO TABS: 1.0000 mg | ORAL_TABLET | Freq: Every day | ORAL | Status: DC | PRN

## 2012-11-11 NOTE — Telephone Encounter (Signed)
Pt has appt in November.  90 supply faxed to [facility]

## 2012-11-11 NOTE — Telephone Encounter (Signed)
Pt has been trying to refill LORazepam (ATIVAN) 1 MG tablet., but [facility] states they have not heard from [place] about this med. Pls advise. Pt would like 90 day supply.

## 2012-11-12 ENCOUNTER — Other Ambulatory Visit: Payer: Self-pay | Admitting: Internal Medicine

## 2012-11-18 ENCOUNTER — Encounter: Payer: Self-pay | Admitting: Gastroenterology

## 2012-11-20 ENCOUNTER — Telehealth: Payer: Self-pay | Admitting: Internal Medicine

## 2012-11-20 ENCOUNTER — Other Ambulatory Visit: Payer: Self-pay | Admitting: Internal Medicine

## 2012-11-20 DIAGNOSIS — H9190 Unspecified hearing loss, unspecified ear: Secondary | ICD-10-CM

## 2012-11-20 NOTE — Telephone Encounter (Signed)
Ok per Dr. Swords, referral order placed 

## 2012-11-20 NOTE — Telephone Encounter (Signed)
PT is requesting a referral to see Dr. Nicholaus Corolla (Audiologist). He states that he was seen previously by her for his hearing aid. In the past visits where not completely covered, and he was instructed to obtain a referral from his pcp. Please assist.

## 2013-01-22 DIAGNOSIS — E785 Hyperlipidemia, unspecified: Secondary | ICD-10-CM | POA: Insufficient documentation

## 2013-01-22 HISTORY — DX: Hyperlipidemia, unspecified: E78.5

## 2013-01-23 ENCOUNTER — Other Ambulatory Visit: Payer: Self-pay | Admitting: Internal Medicine

## 2013-02-27 ENCOUNTER — Encounter: Payer: Medicare PPO | Admitting: Internal Medicine

## 2013-06-17 ENCOUNTER — Encounter: Payer: Self-pay | Admitting: Gastroenterology

## 2013-06-26 ENCOUNTER — Other Ambulatory Visit: Payer: Self-pay | Admitting: Cardiology

## 2013-07-01 ENCOUNTER — Telehealth: Payer: Self-pay | Admitting: Internal Medicine

## 2013-07-01 NOTE — Telephone Encounter (Signed)
[place] [facility] [address] requesting refill of LORazepam (ATIVAN) 1 MG tablet

## 2013-07-02 NOTE — Telephone Encounter (Signed)
Pt needs to be seen.  He has not seen Dr. Leanne Chang since 2013.  He has seen other providers for acute visits but he needs an appt for med refills

## 2013-07-02 NOTE — Telephone Encounter (Signed)
Pt has appt on 08/26/13

## 2013-07-02 NOTE — Telephone Encounter (Signed)
lmom for pt to call back

## 2013-07-02 NOTE — Telephone Encounter (Signed)
Pt has a scheduled appointment, pt needs enough meds to last until he see dr. Leanne Chang.

## 2013-07-03 MED ORDER — LORAZEPAM 1 MG PO TABS: 1.0000 mg | ORAL_TABLET | Freq: Every day | ORAL | Status: DC | PRN

## 2013-07-03 NOTE — Telephone Encounter (Signed)
rx was faxed.

## 2013-07-22 ENCOUNTER — Ambulatory Visit (INDEPENDENT_AMBULATORY_CARE_PROVIDER_SITE_OTHER): Payer: Medicare PPO | Admitting: Gastroenterology

## 2013-07-22 ENCOUNTER — Telehealth: Payer: Self-pay | Admitting: *Deleted

## 2013-07-22 ENCOUNTER — Encounter: Payer: Self-pay | Admitting: Gastroenterology

## 2013-07-22 VITALS — BP 126/68 | HR 80 | Ht 73.0 in | Wt 212.8 lb

## 2013-07-22 DIAGNOSIS — R1310 Dysphagia, unspecified: Secondary | ICD-10-CM

## 2013-07-22 DIAGNOSIS — R51 Headache: Secondary | ICD-10-CM

## 2013-07-22 DIAGNOSIS — Z86010 Personal history of colonic polyps: Secondary | ICD-10-CM | POA: Insufficient documentation

## 2013-07-22 NOTE — Assessment & Plan Note (Signed)
Although three-year followup was recommended because of limitations of prior colonoscopy due to prep will defer this exam because of his comorbidities

## 2013-07-22 NOTE — Assessment & Plan Note (Addendum)
Dysphagia is likely 2 to a recurrent esophageal stricture.  Last endoscopy was unsuccessful because of difficulty intubating the esophagus.  Accordingly, I will use a wire-guided intubation technique.  Patient has other multiple comorbidities which are stable.  Recommendations #1 upper endoscopy with wire guided dilation.

## 2013-07-22 NOTE — Progress Notes (Signed)
_                                                                                                                History of Present Illness: 80 year old white male with history of esophageal stricture, hypertension, pulmonary fibrosis, COPD, coronary artery disease referred for evaluation of dysphagia.  He's complaining of dysphagia to solids.  He denies pyrosis.  A distal esophageal stricture was dilated over 10 years ago.  At endoscopy in May, 2014 was unsuccessful because of inability to intubate the esophagus.  An adenomatous polyp was removed in 2011.    Past Medical History  Diagnosis Date  . Kidney tumor (benign)   . Chronic headache   . Hypertension   . Nephrolithiasis   . Renal insufficiency   . Peripheral vascular disease   . BPH (benign prostatic hypertrophy)   . CAD (coronary artery disease)   . Detached retina   . Asthma   . Allergic rhinitis   . Sleep apnea   . Pulmonary fibrosis Jan 2009    very subtle possible fibrosis noted on CT Jan 2009.  Unchanged since Oct 2006. Deemed possibly due to chlorine exposure at gym work swimming pool  . COPD (chronic obstructive pulmonary disease)     2001 PFTs  - FEv1 2.5L/66%, DLCO 57% -> March 2009: Fev1 1.91L/625, Ratio 50, DLCO 50%  . AAA (abdominal aortic aneurysm)     Rupture 16 years ago  . History of colonic diverticulitis   . Aneurysm, common iliac artery   . Myocardial infarction     minor in 2000, 2001  . Aortic insufficiency   . Chronic systolic CHF (congestive heart failure)    Past Surgical History  Procedure Laterality Date  . Nephrectomy  2003     left partial, benign tumor  . Vasectomy    . Ptca  2001 and 2002  . Abdominal aortic aneurysm repair  1997  . Cataract extraction      bilateral  . Cardiac catheterization  07/22/02    EF 55%  . Abdominal aortic aneurysm repair    . Eye surgery    . Esophagogastroduodenoscopy N/A 09/15/2012    Procedure: ESOPHAGOGASTRODUODENOSCOPY (EGD);   Surgeon: Lafayette Dragon, MD;  Location: [facility] ENDOSCOPY;  Service: Endoscopy;  Laterality: N/A;   family history includes Aneurysm (age of onset: 39) in his father; Hypertension in his mother; Other in his father and mother. Current Outpatient Prescriptions  Medication Sig Dispense Refill  . acetaminophen (TYLENOL) 325 MG tablet Take 650 mg by mouth every 6 (six) hours as needed for pain.      Marland Kitchen aspirin 81 MG tablet Take 81 mg by mouth daily.        Marland Kitchen doxazosin (CARDURA) 8 MG tablet TAKE 1 TABLET BY MOUTH EVERY DAY  90 tablet  3  . finasteride (PROSCAR) 5 MG tablet TAKE 1 TABLET (5 MG TOTAL) DAILY.  30 tablet  0  . ibuprofen (ADVIL,MOTRIN) 800 MG tablet Take 800 mg by mouth every  8 (eight) hours as needed.       Marland Kitchen lisinopril (PRINIVIL,ZESTRIL) 40 MG tablet TAKE 1 TABLET EVERY DAY  90 tablet  0  . LORazepam (ATIVAN) 1 MG tablet Take 1 tablet (1 mg total) by mouth daily as needed for anxiety.  90 tablet  0  . nitroGLYCERIN (NITROSTAT) 0.4 MG SL tablet Place 1 tablet (0.4 mg total) under the tongue every 5 (five) minutes as needed for chest pain.  90 tablet  3  . omeprazole (PRILOSEC) 20 MG capsule TAKE 1 CAPSULE TWICE A DAY  180 capsule  0  . polyethylene glycol (MIRALAX / GLYCOLAX) packet Take 17 g by mouth daily as needed (constipation).       Marland Kitchen zolpidem (AMBIEN) 5 MG tablet Take 1 tablet (5 mg total) by mouth at bedtime as needed.  30 tablet  1   No current facility-administered medications for this visit.   Allergies as of 07/22/2013 - Review Complete 07/22/2013  Allergen Reaction Noted  . Beta adrenergic blockers Other (See Comments) 05/09/2007  . Statins Other (See Comments) 07/22/2013    reports that he quit smoking about 16 years ago. His smoking use included Cigarettes. He has a 50 pack-year smoking history. He has never used smokeless tobacco. He reports that he does not drink alcohol or use illicit drugs.     Review of Systems: He has stable dyspnea on exertion Pertinent positive  and negative review of systems were noted in the above HPI section. All other review of systems were otherwise negative.  Vital signs were reviewed in today's medical record Physical Exam: General: Well developed , well nourished, no acute distress Skin: anicteric Head: Normocephalic and atraumatic Eyes:  sclerae anicteric, EOMI Ears: Normal auditory acuity Mouth: No deformity or lesions Neck: Supple, no masses or thyromegaly Lungs: Clear throughout to auscultation Heart: Regular rate and rhythm; no murmurs, rubs or bruits Abdomen: Soft, non tender and non distended. No  hepatosplenomegaly or hernias noted. Normal Bowel sounds.  There is a 1 x 2 cm probable lipoma just below it the xiphoid Rectal:deferred Musculoskeletal: Symmetrical with no gross deformities  Skin: No lesions on visible extremities Pulses:  Normal pulses noted Extremities: No clubbing, cyanosis, edema or deformities noted Neurological: Alert oriented x 4, grossly nonfocal Cervical Nodes:  No significant cervical adenopathy Inguinal Nodes: No significant inguinal adenopathy Psychological:  Alert and cooperative. Normal mood and affect  See Assessment and Plan under Problem List

## 2013-07-22 NOTE — Telephone Encounter (Signed)
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  Appointment is 07/27/13.

## 2013-07-27 ENCOUNTER — Telehealth: Payer: Self-pay | Admitting: Internal Medicine

## 2013-07-27 NOTE — Telephone Encounter (Signed)
Pt need refer to [facility] per Pam pt will reschedule todays appt

## 2013-07-27 NOTE — Telephone Encounter (Signed)
Error // ck °

## 2013-08-19 ENCOUNTER — Telehealth: Payer: Self-pay | Admitting: Gastroenterology

## 2013-08-19 MED ORDER — OMEPRAZOLE 20 MG PO CPDR: DELAYED_RELEASE_CAPSULE | ORAL | Status: DC

## 2013-08-19 NOTE — Telephone Encounter (Signed)
Called pt to inform med sent 90 day to [facility]  No answer

## 2013-08-24 ENCOUNTER — Other Ambulatory Visit: Payer: Self-pay | Admitting: Cardiology

## 2013-08-26 ENCOUNTER — Ambulatory Visit: Payer: Medicare PPO | Admitting: Internal Medicine

## 2013-09-09 ENCOUNTER — Ambulatory Visit (HOSPITAL_COMMUNITY): Payer: Medicare HMO

## 2013-09-09 ENCOUNTER — Encounter (HOSPITAL_COMMUNITY): Payer: Medicare HMO | Admitting: Anesthesiology

## 2013-09-09 ENCOUNTER — Encounter (HOSPITAL_COMMUNITY): Payer: Self-pay

## 2013-09-09 ENCOUNTER — Ambulatory Visit (HOSPITAL_COMMUNITY): Payer: Medicare HMO | Admitting: Anesthesiology

## 2013-09-09 ENCOUNTER — Ambulatory Visit (HOSPITAL_COMMUNITY)
Admission: RE | Admit: 2013-09-09 | Discharge: 2013-09-09 | Disposition: A | Discharge status: Home / Self Care | Payer: Medicare HMO | Source: Ambulatory Visit | Attending: Gastroenterology | Admitting: Gastroenterology

## 2013-09-09 ENCOUNTER — Encounter (HOSPITAL_COMMUNITY)
Admission: RE | Disposition: A | Discharge status: Home / Self Care | Payer: Self-pay | Source: Ambulatory Visit | Attending: Gastroenterology

## 2013-09-09 DIAGNOSIS — I739 Peripheral vascular disease, unspecified: Secondary | ICD-10-CM | POA: Insufficient documentation

## 2013-09-09 DIAGNOSIS — F411 Generalized anxiety disorder: Secondary | ICD-10-CM | POA: Insufficient documentation

## 2013-09-09 DIAGNOSIS — I5022 Chronic systolic (congestive) heart failure: Secondary | ICD-10-CM | POA: Insufficient documentation

## 2013-09-09 DIAGNOSIS — Z86010 Personal history of colonic polyps: Secondary | ICD-10-CM

## 2013-09-09 DIAGNOSIS — Z87891 Personal history of nicotine dependence: Secondary | ICD-10-CM | POA: Insufficient documentation

## 2013-09-09 DIAGNOSIS — N400 Enlarged prostate without lower urinary tract symptoms: Secondary | ICD-10-CM | POA: Insufficient documentation

## 2013-09-09 DIAGNOSIS — I509 Heart failure, unspecified: Secondary | ICD-10-CM | POA: Insufficient documentation

## 2013-09-09 DIAGNOSIS — I2510 Atherosclerotic heart disease of native coronary artery without angina pectoris: Secondary | ICD-10-CM | POA: Insufficient documentation

## 2013-09-09 DIAGNOSIS — I252 Old myocardial infarction: Secondary | ICD-10-CM | POA: Insufficient documentation

## 2013-09-09 DIAGNOSIS — K222 Esophageal obstruction: Secondary | ICD-10-CM

## 2013-09-09 DIAGNOSIS — I10 Essential (primary) hypertension: Secondary | ICD-10-CM | POA: Insufficient documentation

## 2013-09-09 DIAGNOSIS — J4489 Other specified chronic obstructive pulmonary disease: Secondary | ICD-10-CM | POA: Insufficient documentation

## 2013-09-09 DIAGNOSIS — K5900 Constipation, unspecified: Secondary | ICD-10-CM | POA: Insufficient documentation

## 2013-09-09 DIAGNOSIS — J449 Chronic obstructive pulmonary disease, unspecified: Secondary | ICD-10-CM | POA: Insufficient documentation

## 2013-09-09 DIAGNOSIS — G4730 Sleep apnea, unspecified: Secondary | ICD-10-CM | POA: Insufficient documentation

## 2013-09-09 DIAGNOSIS — I2789 Other specified pulmonary heart diseases: Secondary | ICD-10-CM | POA: Insufficient documentation

## 2013-09-09 DIAGNOSIS — R1310 Dysphagia, unspecified: Secondary | ICD-10-CM | POA: Insufficient documentation

## 2013-09-09 HISTORY — PX: ESOPHAGOGASTRODUODENOSCOPY: SHX5428

## 2013-09-09 HISTORY — PX: SAVORY DILATION: SHX5439

## 2013-09-09 SURGERY — EGD (ESOPHAGOGASTRODUODENOSCOPY)
Anesthesia: Monitor Anesthesia Care

## 2013-09-09 MED ORDER — KETAMINE HCL 10 MG/ML IJ SOLN
INTRAMUSCULAR | Status: DC | PRN
  Administered 2013-09-09: 20 mg via INTRAVENOUS
  Administered 2013-09-09: 10 mg via INTRAVENOUS

## 2013-09-09 MED ORDER — LIDOCAINE HCL (CARDIAC) 20 MG/ML IV SOLN
INTRAVENOUS | Status: AC
  Filled 2013-09-09: qty 5

## 2013-09-09 MED ORDER — MIDAZOLAM HCL 2 MG/2ML IJ SOLN
INTRAMUSCULAR | Status: AC
  Filled 2013-09-09: qty 2

## 2013-09-09 MED ORDER — PROPOFOL 10 MG/ML IV BOLUS
INTRAVENOUS | Status: DC | PRN
  Administered 2013-09-09 (×2): 20 mg via INTRAVENOUS

## 2013-09-09 MED ORDER — PROPOFOL INFUSION 10 MG/ML OPTIME
INTRAVENOUS | Status: DC | PRN
  Administered 2013-09-09: 75 ug/kg/min via INTRAVENOUS

## 2013-09-09 MED ORDER — LACTATED RINGERS IV SOLN
INTRAVENOUS | Status: DC
  Administered 2013-09-09: 12:00:00 via INTRAVENOUS

## 2013-09-09 MED ORDER — LIDOCAINE HCL (CARDIAC) 20 MG/ML IV SOLN
INTRAVENOUS | Status: DC | PRN
  Administered 2013-09-09: 60 mg via INTRAVENOUS

## 2013-09-09 MED ORDER — SODIUM CHLORIDE 0.9 % IV SOLN: INTRAVENOUS | Status: DC

## 2013-09-09 MED ORDER — PROPOFOL 10 MG/ML IV BOLUS
INTRAVENOUS | Status: AC
  Filled 2013-09-09: qty 20

## 2013-09-09 MED ORDER — KETAMINE HCL 10 MG/ML IJ SOLN
INTRAMUSCULAR | Status: AC
  Filled 2013-09-09: qty 1

## 2013-09-09 NOTE — Anesthesia Postprocedure Evaluation (Signed)
  Anesthesia Post-op Note  Patient: [name]  Procedure(s) Performed: Procedure(s) (LRB): ESOPHAGOGASTRODUODENOSCOPY (EGD) (N/A) SAVORY DILATION (N/A)  Patient Location: PACU  Anesthesia Type: MAC  Level of Consciousness: awake and alert   Airway and Oxygen Therapy: Patient Spontanous Breathing  Post-op Pain: mild  Post-op Assessment: Post-op Vital signs reviewed, Patient's Cardiovascular Status Stable, Respiratory Function Stable, Patent Airway and No signs of Nausea or vomiting  Last Vitals:  Filed Vitals:   09/09/13 1316  BP: 142/70  Pulse: 72  Temp:   Resp: 19    Post-op Vital Signs: stable   Complications: No apparent anesthesia complications

## 2013-09-09 NOTE — Transfer of Care (Signed)
Immediate Anesthesia Transfer of Care Note  Patient: Willene Hatchet  Procedure(s) Performed: Procedure(s) with comments: ESOPHAGOGASTRODUODENOSCOPY (EGD) (N/A) - talk to robin ,had no opening for mac at this time could not put a mac case in dr. Paulita Fujita day .Shirlean Mylar said to put it as moderate sed.  jacqueline aiken,if something come available will contac robin SAVORY DILATION (N/A)  Patient Location: PACU and Endoscopy Unit  Anesthesia Type:MAC  Level of Consciousness: awake and sedated  Airway & Oxygen Therapy: Patient Spontanous Breathing and Patient connected to nasal cannula oxygen  Post-op Assessment: Report given to PACU RN and Post -op Vital signs reviewed and stable  Post vital signs: Reviewed and stable  Complications: No apparent anesthesia complications

## 2013-09-09 NOTE — Op Note (Signed)
[facility] [place], [address]   ENDOSCOPY PROCEDURE REPORT  PATIENT: [name], [name]  MR#: [id number] BIRTHDATE: 09-20-1930 , 82  yrs. old GENDER: Male ENDOSCOPIST: Inda Castle, MD ASSISTANT:   Sharon Mt, Endo Technician Clemmie Krill, RN, BSN REFERRED BY: PROCEDURE DATE:  09/09/2013 PROCEDURE:   EGD with dilatation over guidewire ASA CLASS:   Class III INDICATIONS:dysphagia. MEDICATIONS: MAC sedation, administered by CRNA TOPICAL ANESTHETIC:  DESCRIPTION OF PROCEDURE:   After the risks benefits and alternatives of the procedure were thoroughly explained, informed consent was obtained.  The     endoscope was introduced through the mouth  and advanced to the third portion of the duodenum ,      The instrument was slowly withdrawn as the mucosa was carefully examined.    There was a moderate stricture at the GE junction.  The 9 mm gastroscope easily traversed the stricture.  A 3 cm sliding hiatal hernia was present.   The remainder of the upper endoscopy exam was otherwise normal.     Dilation was then performed at the gastroesphageal junction  Dilator:Savary over guidewire Size:16-16mm, under fluoroscopic guidance  Resistance:minimal Heme:none  COMPLICATIONS: There were no complications. ENDOSCOPIC IMPRESSION: 1.   esophageal stricture-status post Savary dilatation  RECOMMENDATIONS: repeat dilation as needed  eSigned:  Inda Castle, MD 09/09/2013 1:00 PM  JO:ACZYS Kendall Flack, MD Marylynn Pearson MD  PATIENT NAME:  [name], [name] MR#: [id number]

## 2013-09-09 NOTE — H&P (Signed)
History of Present Illness: 80 year old white male with history of esophageal stricture, hypertension, pulmonary fibrosis, COPD, coronary artery disease referred for evaluation of dysphagia.  He's complaining of dysphagia to solids.  He denies pyrosis.  A distal esophageal stricture was dilated over 10 years ago.  At endoscopy in May, 2014 was unsuccessful because of inability to intubate the esophagus.  An adenomatous polyp was removed in 2011.        Past Medical History   Diagnosis  Date   .  Kidney tumor (benign)     .  Chronic headache     .  Hypertension     .  Nephrolithiasis     .  Renal insufficiency     .  Peripheral vascular disease     .  BPH (benign prostatic hypertrophy)     .  CAD (coronary artery disease)     .  Detached retina     .  Asthma     .  Allergic rhinitis     .  Sleep apnea     .  Pulmonary fibrosis  Jan 2009       very subtle possible fibrosis noted on CT Jan 2009.  Unchanged since Oct 2006. Deemed possibly due to chlorine exposure at gym work swimming pool   .  COPD (chronic obstructive pulmonary disease)         2001 PFTs  - FEv1 2.5L/66%, DLCO 57% -> March 2009: Fev1 1.91L/625, Ratio 50, DLCO 50%   .  AAA (abdominal aortic aneurysm)         Rupture 16 years ago   .  History of colonic diverticulitis     .  Aneurysm, common iliac artery     .  Myocardial infarction         minor in 2000, 2001   .  Aortic insufficiency     .  Chronic systolic CHF (congestive heart failure)         Past Surgical History   Procedure  Laterality  Date   .  Nephrectomy    2003        left partial, benign tumor   .  Vasectomy       .  Ptca    2001 and 2002   .  Abdominal aortic aneurysm repair    1997   .  Cataract extraction           bilateral   .  Cardiac catheterization    07/22/02       EF 55%   .  Abdominal aortic aneurysm repair       .  Eye surgery       .  Esophagogastroduodenoscopy  N/A  09/15/2012       Procedure: ESOPHAGOGASTRODUODENOSCOPY (EGD);   Surgeon: Lafayette Dragon, MD;  Location[facility] ENDOSCOPY;  Service: Endoscopy;  Laterality: N/A;      family history includes Aneurysm (age of onset: 68) in his father; Hypertension in his mother; Other in his father and mother. Current Outpatient Prescriptions   Medication  Sig  Dispense  Refill   .  acetaminophen (TYLENOL) 325 MG tablet  Take 650 mg by mouth every 6 (six) hours as needed for pain.         Marland Kitchen  aspirin 81 MG tablet  Take 81 mg by mouth daily.           Marland Kitchen  doxazosin (CARDURA) 8 MG tablet  TAKE 1 TABLET BY MOUTH  EVERY DAY   90 tablet   3   .  finasteride (PROSCAR) 5 MG tablet  TAKE 1 TABLET (5 MG TOTAL) DAILY.   30 tablet   0   .  ibuprofen (ADVIL,MOTRIN) 800 MG tablet  Take 800 mg by mouth every 8 (eight) hours as needed.          Marland Kitchen  lisinopril (PRINIVIL,ZESTRIL) 40 MG tablet  TAKE 1 TABLET EVERY DAY   90 tablet   0   .  LORazepam (ATIVAN) 1 MG tablet  Take 1 tablet (1 mg total) by mouth daily as needed for anxiety.   90 tablet   0   .  nitroGLYCERIN (NITROSTAT) 0.4 MG SL tablet  Place 1 tablet (0.4 mg total) under the tongue every 5 (five) minutes as needed for chest pain.   90 tablet   3   .  omeprazole (PRILOSEC) 20 MG capsule  TAKE 1 CAPSULE TWICE A DAY   180 capsule   0   .  polyethylene glycol (MIRALAX / GLYCOLAX) packet  Take 17 g by mouth daily as needed (constipation).          Marland Kitchen  zolpidem (AMBIEN) 5 MG tablet  Take 1 tablet (5 mg total) by mouth at bedtime as needed.   30 tablet   1       No current facility-administered medications for this visit.       Allergies as of 07/22/2013 - Review Complete 07/22/2013   Allergen  Reaction  Noted   .  Beta adrenergic blockers  Other (See Comments)  05/09/2007   .  Statins  Other (See Comments)  07/22/2013       reports that he quit smoking about 16 years ago. His smoking use included Cigarettes. He has a 50 pack-year smoking history. He has never used smokeless tobacco. He reports that he does not drink alcohol or use illicit  drugs.         Review of Systems: He has stable dyspnea on exertion Pertinent positive and negative review of systems were noted in the above HPI section. All other review of systems were otherwise negative.   Vital signs were reviewed in today's medical record Physical Exam: General: Well developed , well nourished, no acute distress Skin: anicteric Head: Normocephalic and atraumatic Eyes:  sclerae anicteric, EOMI Ears: Normal auditory acuity Mouth: No deformity or lesions Neck: Supple, no masses or thyromegaly Lungs: Clear throughout to auscultation Heart: Regular rate and rhythm; no murmurs, rubs or bruits Abdomen: Soft, non tender and non distended. No  hepatosplenomegaly or hernias noted. Normal Bowel sounds.  There is a 1 x 2 cm probable lipoma just below it the xiphoid Rectal:deferred Musculoskeletal: Symmetrical with no gross deformities   Skin: No lesions on visible extremities Pulses:  Normal pulses noted Extremities: No clubbing, cyanosis, edema or deformities noted Neurological: Alert oriented x 4, grossly nonfocal Cervical Nodes:  No significant cervical adenopathy Inguinal Nodes: No significant inguinal adenopathy Psychological:  Alert and cooperative. Normal mood and affect  Impression   1.   Dysphagia, unspecified(787.20)       Status: Edited Related Problem: Dysphagia, unspecified(787.20)     Dysphagia is likely 2 to a recurrent esophageal stricture.  Last endoscopy was unsuccessful because of difficulty intubating the esophagus.  Accordingly, I will use a wire-guided intubation technique.  Patient has other multiple comorbidities which are stable.   Recommendations #1 upper endoscopy with wire guided dilation.    2.  Other  medical problems - stable

## 2013-09-09 NOTE — Anesthesia Preprocedure Evaluation (Addendum)
Anesthesia Evaluation  Patient identified by MRN, date of birth, ID band Patient awake    Reviewed: Allergy & Precautions, H&P , NPO status , Patient's Chart, lab work & pertinent test results  Airway Mallampati: III TM Distance: >3 FB Neck ROM: full    Dental  (+) Edentulous Upper, Edentulous Lower, Dental Advisory Given   Pulmonary asthma , sleep apnea , COPDformer smoker,  Moderate to severe COPD breath sounds clear to auscultation  Pulmonary exam normal       Cardiovascular Exercise Tolerance: Poor hypertension, Pt. on medications + CAD, + Past MI, + Peripheral Vascular Disease and +CHF + Valvular Problems/Murmurs AI Rhythm:regular Rate:Normal + Diastolic murmurs Chronic CHF EF 25%. AAA repair 1987. Pulmonary htn.  LBBB.  MI 2000 and 2001   Neuro/Psych negative neurological ROS  negative psych ROS   GI/Hepatic negative GI ROS, Neg liver ROS, dysphagia   Endo/Other  negative endocrine ROS  Renal/GU negative Renal ROS  negative genitourinary   Musculoskeletal   Abdominal   Peds  Hematology negative hematology ROS (+)   Anesthesia Other Findings   Reproductive/Obstetrics negative OB ROS                          Anesthesia Physical Anesthesia Plan  ASA: IV  Anesthesia Plan: MAC   Post-op Pain Management:    Induction:   Airway Management Planned: Simple Face Mask  Additional Equipment:   Intra-op Plan:   Post-operative Plan:   Informed Consent: I have reviewed the patients History and Physical, chart, labs and discussed the procedure including the risks, benefits and alternatives for the proposed anesthesia with the patient or authorized representative who has indicated his/her understanding and acceptance.   Dental Advisory Given  Plan Discussed with: CRNA and Surgeon  Anesthesia Plan Comments:         Anesthesia Quick Evaluation

## 2013-09-10 ENCOUNTER — Encounter (HOSPITAL_COMMUNITY): Payer: Self-pay | Admitting: Gastroenterology

## 2013-09-17 ENCOUNTER — Telehealth: Payer: Self-pay | Admitting: Internal Medicine

## 2013-09-17 NOTE — Telephone Encounter (Signed)
Butch Penny called from [facility] spoke with [facility] they rec your fax but there was no area code on your phone number and no cpt code for the procedure please  Butch Penny number if you need to call her [phone]  [phone] ext [phone]

## 2013-10-16 NOTE — Telephone Encounter (Signed)
S/w Neoma Laming this has been done

## 2013-11-25 ENCOUNTER — Other Ambulatory Visit: Payer: Self-pay

## 2013-11-25 MED ORDER — LISINOPRIL 40 MG PO TABS: ORAL_TABLET | ORAL | Status: DC

## 2014-02-02 ENCOUNTER — Other Ambulatory Visit: Payer: Self-pay | Admitting: Cardiology

## 2014-02-24 DIAGNOSIS — I713 Abdominal aortic aneurysm, ruptured, unspecified: Secondary | ICD-10-CM | POA: Insufficient documentation

## 2014-02-24 DIAGNOSIS — Z9889 Other specified postprocedural states: Secondary | ICD-10-CM | POA: Insufficient documentation

## 2014-02-24 DIAGNOSIS — Z4889 Encounter for other specified surgical aftercare: Secondary | ICD-10-CM | POA: Insufficient documentation

## 2014-03-09 ENCOUNTER — Other Ambulatory Visit: Payer: Self-pay | Admitting: Internal Medicine

## 2014-03-09 DIAGNOSIS — G43909 Migraine, unspecified, not intractable, without status migrainosus: Secondary | ICD-10-CM

## 2014-03-29 ENCOUNTER — Telehealth: Payer: Self-pay | Admitting: Internal Medicine

## 2014-03-29 ENCOUNTER — Ambulatory Visit: Payer: Medicare PPO | Admitting: Cardiology

## 2014-03-29 NOTE — Telephone Encounter (Signed)
Pt needs appt for refills.  Appt sched

## 2014-04-07 ENCOUNTER — Encounter: Payer: Self-pay | Admitting: Family Medicine

## 2014-04-07 ENCOUNTER — Ambulatory Visit (INDEPENDENT_AMBULATORY_CARE_PROVIDER_SITE_OTHER): Payer: Medicare PPO | Admitting: Family Medicine

## 2014-04-07 VITALS — BP 120/58 | HR 70 | Temp 97.8°F | Wt 200.0 lb

## 2014-04-07 DIAGNOSIS — G43009 Migraine without aura, not intractable, without status migrainosus: Secondary | ICD-10-CM | POA: Insufficient documentation

## 2014-04-07 DIAGNOSIS — K219 Gastro-esophageal reflux disease without esophagitis: Secondary | ICD-10-CM | POA: Insufficient documentation

## 2014-04-07 DIAGNOSIS — N183 Chronic kidney disease, stage 3 unspecified: Secondary | ICD-10-CM

## 2014-04-07 DIAGNOSIS — G44219 Episodic tension-type headache, not intractable: Secondary | ICD-10-CM

## 2014-04-07 DIAGNOSIS — G4700 Insomnia, unspecified: Secondary | ICD-10-CM

## 2014-04-07 MED ORDER — LORAZEPAM 1 MG PO TABS: 1.0000 mg | ORAL_TABLET | Freq: Every day | ORAL | Status: DC | PRN

## 2014-04-07 MED ORDER — IBUPROFEN 800 MG PO TABS: 800.0000 mg | ORAL_TABLET | Freq: Three times a day (TID) | ORAL | Status: DC | PRN

## 2014-04-07 MED ORDER — ZOLPIDEM TARTRATE 5 MG PO TABS: 5.0000 mg | ORAL_TABLET | Freq: Every evening | ORAL | Status: DC | PRN

## 2014-04-07 NOTE — Assessment & Plan Note (Signed)
Patient described a history of migraines approximately once a month that respond to 800mg  ibuprofen and ativan 1mg  (whch he uses to help him rest). Patient reported being tried on topamax as well as zonegran (which was on his medication list at time of visit) to prevent headaches but had constipation. States current regimen is the only one that worked for him. I reviewed his notes back to 2012 without clear migraine diagnosis and in history only mentions "chronic headache". Notes from 02/23/2011 mention lorazepam for upper back pain. In addition-patient with infequent visits and reports going to a [facility] practice as closer to his home. I am concerned about this inconsistent story and patient not having regular visits here despite high risk medications which were refilled including lorazepam and ambien. Will need to have a conversation with patient at his establish visit about diagnosis history as notes say something about headache clinic in phone notes. We will need records of this. In addition, he will need to only get medication through our office in regards to benzo and may sign controlled substance contract if we do in fact continue medication.

## 2014-04-07 NOTE — Progress Notes (Signed)
Garret Reddish, MD Phone: [phone]  Subjective:   [name] is a 80 y.o. year old very pleasant male patient who presents with the following:  Migraine headaches -averages once a week (sometimes will be a month in between othertimes more than once a week). Uses ativan and ibuprofen 100mg . topamax and zonegran caused constipation. Stable headaches for years.   Patient also with insomnia and rarely uses [place]. Most nights sleeps well but helps on nights that are difficult to rest and does not have daytime sleepiness next day.   ROS- no blurry vision, extremity weakness  Past Medical History- Patient Active Problem List   Diagnosis Date Noted  . Headache 04/07/2014    Priority: High  . Chronic systolic CHF (congestive heart failure)- EF 25-30%     Priority: High  . AAA (abdominal aortic aneurysm) 09/18/2011    Priority: High  . COPD (chronic obstructive pulmonary disease)     Priority: High  . CORONARY ARTERY DISEASE 10/09/2006    Priority: High  . Insomnia 04/07/2014    Priority: Medium  . Dysphagia 07/22/2013    Priority: Medium  . Aortic regurgitation 07/24/2010    Priority: Medium  . Pulmonary fibrosis 04/24/2007    Priority: Medium  . BPH (benign prostatic hyperplasia) 10/09/2006    Priority: Medium  . Essential hypertension 10/08/2006    Priority: Medium  . CKD (chronic kidney disease), stage III 10/08/2006    Priority: Medium  . GERD (gastroesophageal reflux disease) 04/07/2014    Priority: Low  . Personal history of colonic polyps 07/22/2013    Priority: Low  . Thrombocytopenia 09/14/2012    Priority: Low  . LBBB (left bundle branch block) 12/20/2009    Priority: Low  . ERECTILE DYSFUNCTION 12/12/2009    Priority: Low  . Peripheral vascular disease 10/09/2006    Priority: Low   Medications- reviewed and updated Current Outpatient Prescriptions  Medication Sig Dispense Refill  . aspirin 81 MG tablet Take 81 mg by mouth daily.      . beta carotene  w/minerals (OCUVITE) tablet Take 1 tablet by mouth daily.    Marland Kitchen doxazosin (CARDURA) 8 MG tablet TAKE 1 TABLET BY MOUTH EVERY DAY 90 tablet 3  . finasteride (PROSCAR) 5 MG tablet TAKE 1 TABLET (5 MG TOTAL) DAILY. 30 tablet 0  . ibuprofen (ADVIL,MOTRIN) 800 MG tablet Take 1 tablet (800 mg total) by mouth every 8 (eight) hours as needed (migraine headaches). 30 tablet 3  . lisinopril (PRINIVIL,ZESTRIL) 40 MG tablet Take 1 tablet (40 mg total) by mouth daily. 30 tablet 6  . LORazepam (ATIVAN) 1 MG tablet Take 1 tablet (1 mg total) by mouth daily as needed for anxiety. 90 tablet 0  . Multiple Vitamin (MULTIVITAMIN) tablet Take 1 tablet by mouth daily.    Marland Kitchen omeprazole (PRILOSEC) 20 MG capsule TAKE 1 CAPSULE TWICE A DAY 180 capsule 3  . polyethylene glycol (MIRALAX / GLYCOLAX) packet Take 17 g by mouth daily as needed (constipation).     Marland Kitchen acetaminophen (TYLENOL) 325 MG tablet Take 650 mg by mouth every 6 (six) hours as needed for pain.    . nitroGLYCERIN (NITROSTAT) 0.4 MG SL tablet Place 1 tablet (0.4 mg total) under the tongue every 5 (five) minutes as needed for chest pain. (Patient not taking: Reported on 04/07/2014) 90 tablet 3  . zolpidem (AMBIEN) 5 MG tablet Take 1 tablet (5 mg total) by mouth at bedtime as needed. 30 tablet 0   No current facility-administered medications for  this visit.    Objective: BP 120/58 mmHg  Pulse 70  Temp(Src) 97.8 F (36.6 C)  Wt 200 lb (90.719 kg) Gen: NAD, resting comfortably CV: RRR 3/6 SEM (chronic) no rubs or gallops Lungs: CTAB no crackles, wheeze, rhonchi. Prolonged expiratory phase.  Abdomen: soft/nontender/nondistended/normal bowel sounds. Ext: no edema Skin: warm, dry, no rash   Assessment/Plan:  Headache Insomnia Patient described a history of migraines approximately once a month that respond to 800mg  ibuprofen and ativan 1mg  (whch he uses to help him rest). Patient reported being tried on topamax as well as zonegran (which was on his  medication list at time of visit) to prevent headaches but had constipation. States current regimen is the only one that worked for him. I reviewed his notes back to 2012 without clear migraine diagnosis and in history only mentions "chronic headache". Notes from 02/23/2011 mention lorazepam for upper back pain. In addition-patient with infequent visits and reports going to a [facility] practice as closer to his home. I am concerned about this inconsistent story and patient not having regular visits here despite high risk medications which were refilled including lorazepam and ambien. Will need to have a conversation with patient at his establish visit about diagnosis history as notes say something about headache clinic in phone notes. We will need records of this. In addition, he will need to only get medication through our office in regards to benzo and may sign controlled substance contract if we do in fact continue medication.    Return precautions advised. Has visit with me in several months to establish.   TO evaluate ongoing ibuprofen 800mg  Orders Placed This Encounter  Procedures  . Basic Metabolic Panel    Standing Status: Future     Number of Occurrences:      Standing Expiration Date: 07/06/2014    Meds ordered this encounter  Medications  . LORazepam (ATIVAN) 1 MG tablet    Sig: Take 1 tablet (1 mg total) by mouth daily as needed for anxiety.    Dispense:  90 tablet    Refill:  0  . ibuprofen (ADVIL,MOTRIN) 800 MG tablet    Sig: Take 1 tablet (800 mg total) by mouth every 8 (eight) hours as needed (migraine headaches).    Dispense:  30 tablet    Refill:  3  . zolpidem (AMBIEN) 5 MG tablet    Sig: Take 1 tablet (5 mg total) by mouth at bedtime as needed.    Dispense:  30 tablet    Refill:  0   >50% of 25 minute office visit was spent on counseling (risks of NSAIDS including cardiac risk, risks of benzos, risks of ambien, discussing receiving care in 1 location ideally) and  coordination of care

## 2014-04-07 NOTE — Patient Instructions (Addendum)
Update kidney function to make sure ibuprofen still safe.   Refilled lorazepam used to help you rest with headaches.   Refilled ambien-using as sparing as possible is idea.   We discussed risks of all medications and that they are not ideal in your age group but other options are limited.

## 2014-04-28 ENCOUNTER — Ambulatory Visit (INDEPENDENT_AMBULATORY_CARE_PROVIDER_SITE_OTHER): Payer: Medicare PPO | Admitting: Family Medicine

## 2014-04-28 ENCOUNTER — Encounter: Payer: Self-pay | Admitting: Family Medicine

## 2014-04-28 VITALS — BP 130/64 | Temp 97.9°F | Wt 200.0 lb

## 2014-04-28 DIAGNOSIS — Z1211 Encounter for screening for malignant neoplasm of colon: Secondary | ICD-10-CM

## 2014-04-28 DIAGNOSIS — L989 Disorder of the skin and subcutaneous tissue, unspecified: Secondary | ICD-10-CM

## 2014-04-28 DIAGNOSIS — K5909 Other constipation: Secondary | ICD-10-CM

## 2014-04-28 DIAGNOSIS — R072 Precordial pain: Secondary | ICD-10-CM

## 2014-04-28 MED ORDER — CEPHALEXIN 500 MG PO CAPS: 500.0000 mg | ORAL_CAPSULE | Freq: Three times a day (TID) | ORAL | Status: DC

## 2014-04-28 MED ORDER — NITROGLYCERIN 0.4 MG SL SUBL: 0.4000 mg | SUBLINGUAL_TABLET | SUBLINGUAL | Status: DC | PRN

## 2014-04-28 NOTE — Progress Notes (Signed)
Garret Reddish, MD Phone: [phone]  Subjective:   [name] is a 81 y.o. year old very pleasant male patient who presents with the following:  Constipation Several months ago had bowel movement every day or every other day with taking fiber. 2 months of difficulty witth bowel movement with 6-7 days in between bowel movements. Abdominal pain before he has the bowel movements including gaseous distension and pain. Takes stool softeners to help finally get something out. Previously could stick his finger up to help disimpact if ever had problem in past but not finding anything at present. Had MRI for similar episode a year ago with [facility]. Taking senna nightly. Not taking colace. Miralax daily with full cap. Past Saturday after 6-7 days without BM had some nonsolid/loose bowel movements. Not up to date on colonoscopy.   Drinks 5-6 bottles of water a day (knows he has CHF).   Painful lesion on buttocks Pimple on buttocks?x several months then recurred. Very painful to sit on. Has tried to "pop" it like a pimple but just makes things worse  ROS- No fever/chlls/vomiting. Nausea noted.   Past Medical History- Patient Active Problem List   Diagnosis Date Noted  . Headache 04/07/2014    Priority: High  . Chronic systolic CHF (congestive heart failure)- EF 25-30%     Priority: High  . AAA (abdominal aortic aneurysm) 09/18/2011    Priority: High  . COPD (chronic obstructive pulmonary disease)     Priority: High  . CORONARY ARTERY DISEASE 10/09/2006    Priority: High  . Insomnia 04/07/2014    Priority: Medium  . Dysphagia 07/22/2013    Priority: Medium  . Aortic regurgitation 07/24/2010    Priority: Medium  . Pulmonary fibrosis 04/24/2007    Priority: Medium  . BPH (benign prostatic hyperplasia) 10/09/2006    Priority: Medium  . Essential hypertension 10/08/2006    Priority: Medium  . CKD (chronic kidney disease), stage III 10/08/2006    Priority: Medium  . GERD  (gastroesophageal reflux disease) 04/07/2014    Priority: Low  . Personal history of colonic polyps 07/22/2013    Priority: Low  . Thrombocytopenia 09/14/2012    Priority: Low  . LBBB (left bundle branch block) 12/20/2009    Priority: Low  . ERECTILE DYSFUNCTION 12/12/2009    Priority: Low  . Peripheral vascular disease 10/09/2006    Priority: Low   Medications- reviewed and updated Current Outpatient Prescriptions  Medication Sig Dispense Refill  . aspirin 81 MG tablet Take 81 mg by mouth daily.      . beta carotene w/minerals (OCUVITE) tablet Take 1 tablet by mouth daily.    Marland Kitchen doxazosin (CARDURA) 8 MG tablet TAKE 1 TABLET BY MOUTH EVERY DAY 90 tablet 3  . finasteride (PROSCAR) 5 MG tablet TAKE 1 TABLET (5 MG TOTAL) DAILY. 30 tablet 0  . ibuprofen (ADVIL,MOTRIN) 800 MG tablet Take 1 tablet (800 mg total) by mouth every 8 (eight) hours as needed (migraine headaches). 30 tablet 3  . lisinopril (PRINIVIL,ZESTRIL) 40 MG tablet Take 1 tablet (40 mg total) by mouth daily. 30 tablet 6  . Multiple Vitamin (MULTIVITAMIN) tablet Take 1 tablet by mouth daily.    Marland Kitchen omeprazole (PRILOSEC) 20 MG capsule TAKE 1 CAPSULE TWICE A DAY 180 capsule 3  . polyethylene glycol (MIRALAX / GLYCOLAX) packet Take 17 g by mouth daily as needed (constipation).     Marland Kitchen acetaminophen (TYLENOL) 325 MG tablet Take 650 mg by mouth every 6 (six) hours as needed  for pain.    Marland Kitchen LORazepam (ATIVAN) 1 MG tablet Take 1 tablet (1 mg total) by mouth daily as needed for anxiety. (Patient not taking: Reported on 04/28/2014) 90 tablet 0  . nitroGLYCERIN (NITROSTAT) 0.4 MG SL tablet Place 1 tablet (0.4 mg total) under the tongue every 5 (five) minutes as needed for chest pain. (Patient not taking: Reported on 04/07/2014) 90 tablet 3  . zolpidem (AMBIEN) 5 MG tablet Take 1 tablet (5 mg total) by mouth at bedtime as needed. (Patient not taking: Reported on 04/28/2014) 30 tablet 0   Objective: BP 130/64 mmHg  Temp(Src) 97.9 F (36.6 C)  Wt  200 lb (90.719 kg) Gen: NAD, resting comfortably Abdomen: soft/nontender/nondistended/normal bowel sounds. No rebound or guarding.  Rectal: no stool burden, enlarged prostate noted Skin: warm, dry, no rash Left buttocks cheek with 1 cm erythematous area with central white area appears to be a pimple. Cannot get area to drain and not indurated.   Assessment/Plan:  Constipation Increase miralax to BID x 7-10 days then follow up. Continue senna. Given recent change in bowel movements (also had similar episode a year ago through [facility] and reportedly had MRI with constipation alone) will refer patient for repeat colonoscopy (he was advised 3 year follow up 5 years ago). Continue fiber. Nausea and abdominal pain resolve after each BM so hopeful these will decrease with this plan.   Painful lesion left buttocks Dos not appear to be abscess as no induration, appears to be pimple but prolonged in course and painful to patient. He has tried steroids TOC and antibiotic ointment. We will trial a 7 day course of keflex which may also help with constipation (if has some diarrhea from antibiotic)   Return precautions advised.   Orders Placed This Encounter  Procedures  . Ambulatory referral to Gastroenterology    Referral Priority:  Routine    Referral Type:  Consultation    Referral Reason:  Specialty Services Required    Requested Specialty:  Gastroenterology    Number of Visits Requested:  1   Refilled nitroglycerin per patient request though has not used in years per patient today.  Meds ordered this encounter  Medications  . cephALEXin (KEFLEX) 500 MG capsule    Sig: Take 1 capsule (500 mg total) by mouth 3 (three) times daily.    Dispense:  21 capsule    Refill:  0  . nitroGLYCERIN (NITROSTAT) 0.4 MG SL tablet    Sig: Place 1 tablet (0.4 mg total) under the tongue every 5 (five) minutes as needed for chest pain.    Dispense:  90 tablet    Refill:  0

## 2014-04-28 NOTE — Patient Instructions (Signed)
Constipation. Continue senna. Take capful of miralax twice a day until you see me back on the 16th. Please keep a journal of your bowel movements.   For the spot on your buttocks. Seems to be a pimple but prolonged. Not a hemorrhoid. Does not appear to be a pressure ulcer. Let's try a short course of antibiotics to see if it helps. May also give you some diarrhea/more regular bowel movement.

## 2014-05-04 ENCOUNTER — Telehealth: Payer: Self-pay | Admitting: Internal Medicine

## 2014-05-04 NOTE — Telephone Encounter (Signed)
Medications called in to [facility]

## 2014-05-04 NOTE — Telephone Encounter (Signed)
Pt request refill of the following: LORazepam (ATIVAN) 1 MG tablet, zolpidem (AMBIEN) 5 MG tablet   Dr Yong Channel had printed the above rx for the pt and pt found out that he could not fax the rx himself. So he is requesting that they be called in to [facility] today      Phamacy: [employer]

## 2014-05-10 ENCOUNTER — Ambulatory Visit (INDEPENDENT_AMBULATORY_CARE_PROVIDER_SITE_OTHER): Payer: Commercial Managed Care - HMO | Admitting: Gastroenterology

## 2014-05-10 ENCOUNTER — Encounter: Payer: Self-pay | Admitting: Gastroenterology

## 2014-05-10 VITALS — BP 144/62 | HR 84 | Ht 73.0 in | Wt 196.5 lb

## 2014-05-10 DIAGNOSIS — R194 Change in bowel habit: Secondary | ICD-10-CM

## 2014-05-10 DIAGNOSIS — R197 Diarrhea, unspecified: Secondary | ICD-10-CM

## 2014-05-10 DIAGNOSIS — Z86010 Personal history of colonic polyps: Secondary | ICD-10-CM

## 2014-05-10 DIAGNOSIS — K5900 Constipation, unspecified: Secondary | ICD-10-CM | POA: Insufficient documentation

## 2014-05-10 NOTE — Assessment & Plan Note (Signed)
Although patient had a suboptimal colonoscopy because of poor prep and three-year follow-up recommended, in view of his multiple comorbidities I will defer elective colonoscopy.  Neoplasm is unlikely.  At most, he may have small polyps which are probably asymptomatic.

## 2014-05-10 NOTE — Assessment & Plan Note (Signed)
Patient probably had idiopathic constipation.  Most recent episode of severe diarrhea is probably antibiotic-related.  Need to rule out pseudomembranous colitis.    Recommendations #1 check stool for C. difficile toxin #2 begin Flora Q #3 if no infection, one stools become normal again, should he develop recurrent constipation I would consider adding MiraLAX as needed

## 2014-05-10 NOTE — Patient Instructions (Signed)
Go to the basement for your lab kit today  Purchase Flora Q over the counter to use once a day  Call back in 4-5 days if no better   We will defer the colonoscopy at this time

## 2014-05-10 NOTE — Progress Notes (Signed)
      History of Present Illness:  Mr. Splawn has returned for evaluation of change in bowel habits.  He was seen 9 months ago for dysphagia.  A distal esophageal stricture was dilated.  In 2011 he underwent colonoscopy where 2 1-2 mm adenomatous polyps were removed.  Exam was suboptimal because of a poor prep.  Over the past 2 months he's developed constipation whereby he may go 6-7 days without a bowel movement followed by her loose stools.  This happened on at least 3-4 occasions.  Approximate 2 weeks ago he was started on an oral antibiotic for a skin infection.  Over the past 2 days he's had uncontrollable diarrhea with incontinence.  He may go up to 6-8 times a day.  He denies abdominal pain or rectal bleeding.  Review of Systems: Pertinent positive and negative review of systems were noted in the above HPI section. All other review of systems were otherwise negative.    Current Medications, Allergies, Past Medical History, Past Surgical History, Family History and Social History were reviewed in Center record  Vital signs were reviewed in today's medical record. Physical Exam: General: Well developed , well nourished, no acute distress Skin: anicteric Head: Normocephalic and atraumatic Eyes:  sclerae anicteric, EOMI Ears: Normal auditory acuity Mouth: No deformity or lesions Lungs: Clear throughout to auscultation Heart: Regular rate and rhythm; no murmurs, rubs or bruits Abdomen: Soft, non tender and non distended. No masses, hepatosplenomegaly or hernias noted. Normal Bowel sounds Rectal:deferred Musculoskeletal: Symmetrical with no gross deformities  Pulses:  Normal pulses noted Extremities: No clubbing, cyanosis, edema or deformities noted Neurological: Alert oriented x 4, grossly nonfocal Psychological:  Alert and cooperative. Normal mood and affect  See Assessment and Plan under Problem List

## 2014-05-11 ENCOUNTER — Other Ambulatory Visit: Payer: Commercial Managed Care - HMO

## 2014-05-11 DIAGNOSIS — R197 Diarrhea, unspecified: Secondary | ICD-10-CM

## 2014-05-12 LAB — CLOSTRIDIUM DIFFICILE BY PCR: CDIFFPCR: DETECTED — AB

## 2014-05-17 ENCOUNTER — Other Ambulatory Visit: Payer: Self-pay

## 2014-05-17 MED ORDER — METRONIDAZOLE 500 MG PO TABS: 500.0000 mg | ORAL_TABLET | Freq: Three times a day (TID) | ORAL | Status: DC

## 2014-06-04 ENCOUNTER — Ambulatory Visit (INDEPENDENT_AMBULATORY_CARE_PROVIDER_SITE_OTHER): Payer: Commercial Managed Care - HMO | Admitting: Family Medicine

## 2014-06-04 ENCOUNTER — Encounter: Payer: Self-pay | Admitting: Family Medicine

## 2014-06-04 VITALS — BP 130/80 | HR 68 | Temp 97.6°F | Wt 200.0 lb

## 2014-06-04 DIAGNOSIS — A0472 Enterocolitis due to Clostridium difficile, not specified as recurrent: Secondary | ICD-10-CM

## 2014-06-04 DIAGNOSIS — K5900 Constipation, unspecified: Secondary | ICD-10-CM

## 2014-06-04 DIAGNOSIS — A047 Enterocolitis due to Clostridium difficile: Secondary | ICD-10-CM

## 2014-06-04 DIAGNOSIS — Z86010 Personal history of colonic polyps: Secondary | ICD-10-CM

## 2014-06-04 DIAGNOSIS — G43009 Migraine without aura, not intractable, without status migrainosus: Secondary | ICD-10-CM

## 2014-06-04 NOTE — Progress Notes (Signed)
Garret Reddish, MD Phone: [phone]  Subjective:   [name] is a 81 y.o. year old very pleasant male patient who presents with the following:  Patient saw me for constipation/change in bowel habits and a prolonged pimple on buttocks. I sent him to GI for bowel changes and history of adenomatous polyp. Antibiotic led to c. Diff infection and he developed diarrhea. This recently has improved after course of flagyl and constipation resumed. Dr. Deatra Ina did not advise repeat colonoscopy. He restarted his miralax and is having regular nonpainful bowel movements for the last 2 days.   ROS- no fever/chills/nausea/vomiting. Chronic low back pain noted.   We also discussed his migraine/headache history and my concerns over inconsistent story. He reports he was seen at headache clinic but cost was too high and care transitioned to this office. Discussed getting records for verification  Past Medical History- Patient Active Problem List   Diagnosis Date Noted  . Headache 04/07/2014    Priority: High  . Chronic systolic CHF (congestive heart failure)- EF 25-30%     Priority: High  . AAA (abdominal aortic aneurysm) 09/18/2011    Priority: High  . COPD (chronic obstructive pulmonary disease)     Priority: High  . CORONARY ARTERY DISEASE 10/09/2006    Priority: High  . Insomnia 04/07/2014    Priority: Medium  . Dysphagia 07/22/2013    Priority: Medium  . Aortic regurgitation 07/24/2010    Priority: Medium  . Pulmonary fibrosis 04/24/2007    Priority: Medium  . BPH (benign prostatic hyperplasia) 10/09/2006    Priority: Medium  . Essential hypertension 10/08/2006    Priority: Medium  . CKD (chronic kidney disease), stage III 10/08/2006    Priority: Medium  . Constipation 05/10/2014    Priority: Low  . GERD (gastroesophageal reflux disease) 04/07/2014    Priority: Low  . History of colonic polyps 07/22/2013    Priority: Low  . Thrombocytopenia 09/14/2012    Priority: Low  . LBBB  (left bundle branch block) 12/20/2009    Priority: Low  . ERECTILE DYSFUNCTION 12/12/2009    Priority: Low  . Peripheral vascular disease 10/09/2006    Priority: Low   Medications- reviewed and updated Current Outpatient Prescriptions  Medication Sig Dispense Refill  . acetaminophen (TYLENOL) 325 MG tablet Take 650 mg by mouth every 6 (six) hours as needed.    Marland Kitchen aspirin EC 81 MG tablet Take 81 mg by mouth daily.    . beta carotene w/minerals (OCUVITE) tablet Take 1 tablet by mouth daily.    . finasteride (PROSCAR) 5 MG tablet Take 5 mg by mouth daily.    . Garlic 2 MG CAPS Take 1 capsule by mouth daily.    Marland Kitchen lisinopril (PRINIVIL,ZESTRIL) 40 MG tablet Take 40 mg by mouth daily.    Marland Kitchen LORazepam (ATIVAN) 1 MG tablet Take 1 mg by mouth daily as needed for anxiety.    . Magnesium 100 MG CAPS Take 1 capsule by mouth daily.    . Melatonin 5 MG TABS Take 1 tablet by mouth daily as needed.    . Multiple Vitamin (MULTIVITAMIN) capsule Take 1 capsule by mouth daily.    . nitroGLYCERIN (NITROSTAT) 0.4 MG SL tablet Place 0.4 mg under the tongue every 5 (five) minutes as needed for chest pain.    Marland Kitchen omeprazole (PRILOSEC) 20 MG capsule Take 20 mg by mouth 2 (two) times daily before a meal.    . tiotropium (SPIRIVA) 18 MCG inhalation capsule Place 18 mcg  into inhaler and inhale daily.    Marland Kitchen zolpidem (AMBIEN) 5 MG tablet Take 5 mg by mouth at bedtime as needed for sleep.    Marland Kitchen Phenylephrine-Acetaminophen 5-325 MG CAPS Take by mouth.     Objective: BP 130/80 mmHg  Pulse 68  Temp(Src) 97.6 F (36.4 C) (Oral)  Wt 200 lb (90.719 kg) Gen: NAD, resting comfortably CV: RRR no rubs or gallops 3/6 systolic ejection murmur Lungs: CTAB no crackles, wheeze, rhonchi, proonged expiratory phase.  Abdomen: soft/nontender/nondistended/normal bowel sounds. No rebound or guarding.  Ext: no edema Skin: warm, dry, no rash on abdomen   Assessment/Plan:  Headache Asked patient to request records from headache clinic.  Hopeful office visits there will clarify his transition to ibuprofen and ativan for managing what he reports are migraines.    Constipation/change in bowel habits C. Diff infection Adenomatous polyp Constipation was evaluated by Dr. Deatra Ina though patient had diarrhea by the time he saw patient due to C. Diff. It was not thought that significant growth of prior polyps would have occurred to provoke such symptoms and repeat colonoscopy was not advised due to age/comorbidities. Fortunately, diarrhea resolved with flagyl from c. Diff. Infection. Continue daily miralax as he is constipated once again.  Return precautions advised. During ROS, Patient reported some moderate chronic back pain and I advised him to trial extended release tylenol scheduled for 2 weeks and follow up for further investigation if this method is not helpful.

## 2014-06-04 NOTE — Progress Notes (Signed)
Pre visit review using our clinic review tool, if applicable. No additional management support is needed unless otherwise documented below in the visit note. 

## 2014-06-04 NOTE — Patient Instructions (Addendum)
Glad the diarrhea has finally resolved  STick with the miralax to prevent constipation  Dr. Kelby Fam has evaluated you and said no colonoscopy needed  Sign release of information at the front desk for the headache clinic  Try tylenol extended release 8 hour pill 1 pill every 8 hours for your aches and pains mainly in the back. Give this at least a 2 week trial, if no improvement-come see me and we will discuss other options.   See me at least every 6 months

## 2014-06-05 NOTE — Assessment & Plan Note (Signed)
Asked patient to request records from headache clinic. Hopeful office visits there will clarify his transition to ibuprofen and ativan for managing what he reports are migraines.

## 2014-06-05 NOTE — Assessment & Plan Note (Addendum)
Constipation was evaluated by Dr. Deatra Ina though patient had diarrhea by the time he saw patient due to C. Diff. It was not thought that significant growth of prior polyps would have occurred to provoke such symptoms and repeat colonoscopy was not advised due to age/comorbidities. Fortunately, diarrhea resolved with flagyl from c. Diff. Infection. Continue daily miralax as he is constipated once again.

## 2014-06-22 NOTE — Progress Notes (Signed)
[facility]  03/16/14  HPI: history of HA since 45s.   Ativan in medication list  Diagnosis: migraine without aura, cervicalgia, occipital neuralgia, supraoorbital nerualgia, yalgia, spasm of muscle.   Did not tolerate zonegran 150mg  daily, self d/c. Lack of tolerance for unclear reason-previous visits had titrated up.  Patient wanted to restart topamax which previously had been as high as 350mg . Plan was up to 75mg  at that visit. Topamax had previously become inefective  Sounds like patient had not tolerated injections(nerve blocks)  in the past-had post injection headaches. Not clear what was injected.  Triptans contraindicated.  Discouraged use ibuprofen for back pain. F/u 3 months.   Ativan was mentioned in medication list but never mentioned as a therapy for migraines.

## 2014-08-24 ENCOUNTER — Encounter: Payer: Medicare PPO | Admitting: Family Medicine

## 2014-08-26 ENCOUNTER — Other Ambulatory Visit: Payer: Self-pay | Admitting: Cardiology

## 2014-08-27 NOTE — Telephone Encounter (Signed)
Patient has not been seen in over two years. Please advise on refill. Thanks, MI

## 2014-09-03 MED ORDER — LISINOPRIL 40 MG PO TABS: 40.0000 mg | ORAL_TABLET | Freq: Every day | ORAL | Status: DC

## 2014-09-03 NOTE — Telephone Encounter (Signed)
Patient called no answer.Left message on personal voice mail received a message you needed a refill for LIsinopril.Last office visit with Dr.Jordan 06/02/12.Advised to keep appointment scheduled with Dr.Jordan 11/08/14 at 4:30 pm at [facility].Advised to call back if he needs directions.

## 2014-10-04 ENCOUNTER — Other Ambulatory Visit: Payer: Self-pay | Admitting: Gastroenterology

## 2014-10-15 ENCOUNTER — Telehealth: Payer: Self-pay | Admitting: General Practice

## 2014-10-15 NOTE — Telephone Encounter (Signed)
Patient has seen Dr. Yong Channel on multiple occasions but not for an official establishment visit, does he still need it or can I change the PCP to [facility]??

## 2014-10-15 NOTE — Telephone Encounter (Signed)
See below

## 2014-10-16 NOTE — Telephone Encounter (Signed)
I was able to review most of his history. You can change the title and he does not need an establish visit.

## 2014-10-18 ENCOUNTER — Ambulatory Visit (INDEPENDENT_AMBULATORY_CARE_PROVIDER_SITE_OTHER): Payer: Commercial Managed Care - HMO | Admitting: Family Medicine

## 2014-10-18 ENCOUNTER — Encounter: Payer: Self-pay | Admitting: Family Medicine

## 2014-10-18 VITALS — BP 122/60 | HR 84 | Temp 98.5°F | Wt 197.0 lb

## 2014-10-18 DIAGNOSIS — M62830 Muscle spasm of back: Secondary | ICD-10-CM

## 2014-10-18 MED ORDER — ORPHENADRINE CITRATE ER 100 MG PO TB12: 100.0000 mg | ORAL_TABLET | Freq: Two times a day (BID) | ORAL | Status: DC | PRN

## 2014-10-18 MED ORDER — CYCLOBENZAPRINE HCL 5 MG PO TABS: 5.0000 mg | ORAL_TABLET | Freq: Three times a day (TID) | ORAL | Status: DC | PRN

## 2014-10-18 NOTE — Progress Notes (Signed)
Garret Reddish, MD  Subjective:  [name] is a 81 y.o. year old very pleasant male patient who presents with:  Left Shoulder Pain -years of shoulder pain. Fell through ceiling in 2000 (though landed on leg but noted immediate pain in left shoulder just medial to scapula). 2006 cervical films- DDD c5-c. No imaging of shoulder. Flexeril has helped a great deal in the past (flares every once and a while). Current pain started a few days ago. Stable in course. No treatments tried.   ROS- no changes in baseline shortness of breath. No chest pain, labored breathing. No fever, chills, redness over area of pain or rash  Past Medical History- CHF EF 25-30% not a candidate for beta blocker due to pulmonary disease, COPD, CAD  Medications- reviewed and updated Current Outpatient Prescriptions  Medication Sig Dispense Refill  . aspirin EC 81 MG tablet Take 81 mg by mouth daily.    . beta carotene w/minerals (OCUVITE) tablet Take 1 tablet by mouth daily.    . finasteride (PROSCAR) 5 MG tablet Take 5 mg by mouth daily.    . Garlic 2 MG CAPS Take 1 capsule by mouth daily.    Marland Kitchen lisinopril (PRINIVIL,ZESTRIL) 40 MG tablet Take 1 tablet (40 mg total) by mouth daily. 30 tablet 2  . Magnesium 100 MG CAPS Take 1 capsule by mouth daily.    . Multiple Vitamin (MULTIVITAMIN) capsule Take 1 capsule by mouth daily.    Marland Kitchen omeprazole (PRILOSEC) 20 MG capsule TAKE 1 CAPSULE TWICE A DAY 180 capsule 3  . Phenylephrine-Acetaminophen 5-325 MG CAPS Take by mouth.    . tiotropium (SPIRIVA) 18 MCG inhalation capsule Place 18 mcg into inhaler and inhale daily.    Marland Kitchen acetaminophen (TYLENOL) 325 MG tablet Take 650 mg by mouth every 6 (six) hours as needed.    Marland Kitchen LORazepam (ATIVAN) 1 MG tablet Take 1 mg by mouth daily as needed for anxiety.    . Melatonin 5 MG TABS Take 1 tablet by mouth daily as needed.    . nitroGLYCERIN (NITROSTAT) 0.4 MG SL tablet Place 0.4 mg under the tongue every 5 (five) minutes as needed for chest pain.     Marland Kitchen zolpidem (AMBIEN) 5 MG tablet Take 5 mg by mouth at bedtime as needed for sleep.     Objective: BP 122/60 mmHg  Pulse 84  Temp(Src) 98.5 F (36.9 C)  Wt 197 lb (89.359 kg) Gen: NAD, resting comfortably CV: RRR no murmurs rubs or gallops Lungs: CTAB no crackles, wheeze, rhonchi Ext: no edema Skin: warm, dry Neuro: 5/5 strength in upper extremities  Very Tense muscle spasm noted just medial to left shoulder blade  Bilateral Shoulder: Inspection reveals no abnormalities, atrophy or asymmetry. Palpation is normal with no tenderness over AC joint or bicipital groove. ROM is full in all planes. Rotator cuff strength normal throughout. No signs of impingement with negative Neer and Hawkin's tests, empty can. Some movements do cause pain over area of muscle spasm.  Normal scapular function observed. No painful arc and no drop arm sign.  Assessment/Plan:  Muscle spasm of left upper back Left Shoulder Pain due to tense muscle spasm medial to scapula Long term reoccuring injury usually responding to muscle relaxant. Flexeril was not covered by insurance- which has worked in past. Robaxin tier 4 with prior auth listed, norflex tier 3 with prior auth- we will trial the norflex. If does not work, will work with Virgilio Belling to find an alternate or fill out prior  auth. Follow up instructions given  no signs to suggest this is cardiac or pulmonary related though patient is high risk.   See avs  Meds ordered this encounter  Medications  . DISCONTD: cyclobenzaprine (FLEXERIL) 5 MG tablet  NOT COVERED so tried norflex    Sig: Take 1 tablet (5 mg total) by mouth 3 (three) times daily as needed for muscle spasms.    Dispense:  30 tablet    Refill:  0  . orphenadrine (NORFLEX) 100 MG tablet    Sig: Take 1 tablet (100 mg total) by mouth 2 (two) times daily as needed for muscle spasms.    Dispense:  30 tablet    Refill:  0

## 2014-10-18 NOTE — Telephone Encounter (Signed)
done

## 2014-10-18 NOTE — Assessment & Plan Note (Signed)
Left Shoulder Pain due to tense muscle spasm medial to scapula Long term reoccuring injury usually responding to muscle relaxant. Flexeril was not covered by insurance- which has worked in past. Robaxin tier 4 with prior auth listed, norflex tier 3 with prior auth- we will trial the norflex. If does not work, will work with [place] to find an alternate or fill out prior [place]. Follow up instructions given

## 2014-10-18 NOTE — Patient Instructions (Addendum)
Have record of Pneumonia shot faxed to [place] at [phone].  Really bad muscle spasm next to your left shoulder blade Flexeril has worked in past and I have called this in.  There are some questions on whether this is covered based on how it showed up. We can try norflex if this doesn't work. Please go straight to pharmacy and give Bevelyn Ngo a call [phone] ext. [phone] if not covered  Follow up with me in the next 1-2 weeks if not improving or sooner if worsening. We could get you plugged in with ortho or sports medicine if needed

## 2014-10-27 ENCOUNTER — Telehealth: Payer: Self-pay | Admitting: Family Medicine

## 2014-10-27 MED ORDER — ORPHENADRINE CITRATE ER 100 MG PO TB12: 100.0000 mg | ORAL_TABLET | Freq: Two times a day (BID) | ORAL | Status: AC | PRN

## 2014-10-27 NOTE — Telephone Encounter (Signed)
Called and informed pt that Rx is upfront

## 2014-10-27 NOTE — Telephone Encounter (Signed)
Pt needs written rx orphenadrine 100 mg to pick up.

## 2014-10-27 NOTE — Telephone Encounter (Signed)
Is this ok to fill? 

## 2014-10-27 NOTE — Telephone Encounter (Signed)
Yes thanks may fill. Please remind patient he can only take this medicine twice a day.

## 2014-11-08 ENCOUNTER — Ambulatory Visit (INDEPENDENT_AMBULATORY_CARE_PROVIDER_SITE_OTHER): Payer: Commercial Managed Care - HMO | Admitting: Cardiology

## 2014-11-08 ENCOUNTER — Encounter: Payer: Self-pay | Admitting: Cardiology

## 2014-11-08 VITALS — BP 128/70 | HR 76 | Ht 72.0 in | Wt 196.0 lb

## 2014-11-08 DIAGNOSIS — I714 Abdominal aortic aneurysm, without rupture, unspecified: Secondary | ICD-10-CM

## 2014-11-08 DIAGNOSIS — I5022 Chronic systolic (congestive) heart failure: Secondary | ICD-10-CM | POA: Diagnosis not present

## 2014-11-08 DIAGNOSIS — I10 Essential (primary) hypertension: Secondary | ICD-10-CM

## 2014-11-08 DIAGNOSIS — I351 Nonrheumatic aortic (valve) insufficiency: Secondary | ICD-10-CM

## 2014-11-08 DIAGNOSIS — I2510 Atherosclerotic heart disease of native coronary artery without angina pectoris: Secondary | ICD-10-CM

## 2014-11-08 MED ORDER — NITROGLYCERIN 0.4 MG SL SUBL: 0.4000 mg | SUBLINGUAL_TABLET | SUBLINGUAL | Status: AC | PRN

## 2014-11-08 NOTE — Patient Instructions (Signed)
Continue your current therapy  I will see you in one year   

## 2014-11-08 NOTE — Progress Notes (Signed)
[name] Date of Birth: 1930/12/18 Medical Record #[id number]  History of Present Illness: Mr. [name] is seen for follow up CAD. He was last seen in Februrary 2014.   His coronary disease history includes an inferior posterior myocardial infarction in May of 2000. He had stenting of the distal circumflex. Repeat cardiac catheterization in 2001 demonstrated that the stent was occluded and the vessel filled by right to left collaterals.   He had a history of ruptured abdominal aortic aneurysm in 1999. This was treated with emergency surgery. 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 This has been evaluated by vascular surgery here as well as by Dr. Sammuel Hines in [facility]. Conservative followup has been recommended. Last CT one year ago in [place] showed no change.  On follow up he states he has been doing very well. He denies any angina or SOB. He is very active exercising regularly and working in his yard. He had some abdominal pain and constipation last year but this resolved with medical therapy. He has occasional GERD symptoms. He has lost 12 lbs in last 2 years.  Current Outpatient Prescriptions on File Prior to Visit  Medication Sig Dispense Refill  . acetaminophen (TYLENOL) 325 MG tablet Take 650 mg by mouth every 6 (six) hours as needed.    Marland Kitchen aspirin EC 81 MG tablet Take 81 mg by mouth daily.    . beta carotene w/minerals (OCUVITE) tablet Take 1 tablet by mouth daily.    . finasteride (PROSCAR) 5 MG tablet Take 5 mg by mouth daily.    . Garlic 2 MG CAPS Take 1 capsule by mouth daily.    Marland Kitchen lisinopril (PRINIVIL,ZESTRIL) 40 MG tablet Take 1 tablet (40 mg total) by mouth daily. 30 tablet 2  . LORazepam (ATIVAN) 1 MG tablet Take 1 mg by mouth daily as needed for anxiety.    . Magnesium 100 MG CAPS Take 1 capsule by mouth daily.    . Melatonin 5 MG TABS Take 1 tablet by mouth daily as needed.    . Multiple Vitamin (MULTIVITAMIN) capsule Take 1 capsule by mouth daily.     Marland Kitchen omeprazole (PRILOSEC) 20 MG capsule TAKE 1 CAPSULE TWICE A DAY 180 capsule 3  . orphenadrine (NORFLEX) 100 MG tablet Take 1 tablet (100 mg total) by mouth 2 (two) times daily as needed for muscle spasms. 30 tablet 0  . Phenylephrine-Acetaminophen 5-325 MG CAPS Take by mouth.    . tiotropium (SPIRIVA) 18 MCG inhalation capsule Place 18 mcg into inhaler and inhale daily.    Marland Kitchen zolpidem (AMBIEN) 5 MG tablet Take 5 mg by mouth at bedtime as needed for sleep.     No current facility-administered medications on file prior to visit.    Allergies  Allergen Reactions  . Beta Adrenergic Blockers Other (See Comments)    Difficulty breathing  . Statins Other (See Comments)    Crestor, lipitor    Past Medical History  Diagnosis Date  . Kidney tumor (benign)   . Chronic headache   . Hypertension   . Nephrolithiasis   . Renal insufficiency   . Peripheral vascular disease   . BPH (benign prostatic hypertrophy)   . CAD (coronary artery disease)   . Detached retina   . Asthma   . Allergic rhinitis   . Sleep apnea   . Pulmonary fibrosis Jan 2009    very subtle possible fibrosis noted on CT Jan 2009.  Unchanged since Oct 2006.  Deemed possibly due to chlorine exposure at gym work swimming pool  . COPD (chronic obstructive pulmonary disease)     2001 PFTs  - FEv1 2.5L/66%, DLCO 57% -> March 2009: Fev1 1.91L/625, Ratio 50, DLCO 50%  . AAA (abdominal aortic aneurysm)     Rupture 16 years ago  . History of colonic diverticulitis   . Aneurysm, common iliac artery   . Myocardial infarction     minor in 2000, 2001  . Aortic insufficiency   . Chronic systolic CHF (congestive heart failure)   . Cholelithiasis 09/14/2012  . Pulmonary nodule, left 10/27/2010    #Pulmonary nodule Left Lower Lobe  - CT 2009 present  - CXR 2012 - not present  - Patient prefers clinical followup after weighing options    . NEPHROLITHIASIS, HX OF 10/08/2006    Qualifier: Diagnosis of  By: Leanne Chang MD, Bruce    . PULMONARY  HYPERTENSION 06/02/2007    Qualifier: Diagnosis of  By: Chase Caller MD, Murali  Echo 07/31/10: no mention of pulmonary htn  Tricuspid valve: Structurally normal valve. Leaflet separation was normal. Doppler: Transvalvular velocity was within the normal range. Mild regurgitation.     . TESTOSTERONE DEFICIENCY 02/11/2007    Qualifier: Diagnosis of  By: Leanne Chang MD, Bruce      Past Surgical History  Procedure Laterality Date  . Nephrectomy  2003     left partial, benign tumor  . Vasectomy    . Ptca  2001 and 2002  . Abdominal aortic aneurysm repair  1997  . Cataract extraction      bilateral  . Cardiac catheterization  07/22/02    EF 55%  . Abdominal aortic aneurysm repair    . Eye surgery    . Esophagogastroduodenoscopy N/A 09/15/2012    Procedure: ESOPHAGOGASTRODUODENOSCOPY (EGD);  Surgeon: Lafayette Dragon, MD;  Location: [facility] ENDOSCOPY;  Service: Endoscopy;  Laterality: N/A;  . Esophagogastroduodenoscopy N/A 09/09/2013    Procedure: ESOPHAGOGASTRODUODENOSCOPY (EGD);  Surgeon: Inda Castle, MD;  Location: [facility] ENDOSCOPY;  Service: Endoscopy;  Laterality: N/A;  talk to robin ,had no opening for mac at this time could not put a mac case in dr. Paulita Fujita day .Shirlean Mylar said to put it as moderate sed.  jacqueline aiken,if something come available will contac robin  . Savory dilation N/A 09/09/2013    Procedure: SAVORY DILATION;  Surgeon: Inda Castle, MD;  Location: [facility] ENDOSCOPY;  Service: Endoscopy;  Laterality: N/A;    History  Smoking status  . Former Smoker -- 1.00 packs/day for 50 years  . Types: Cigarettes  . Quit date: 04/23/1997  Smokeless tobacco  . Never Used    History  Alcohol Use No    Family History  Problem Relation Age of Onset  . Aneurysm Father 21    deceased  . Other Father     AAA  . Hypertension Mother   . Other Mother     amputation (leg)    Review of Systems: As noted in HPI.  All other systems were reviewed and are negative.  Physical Exam: BP 128/70 mmHg   Pulse 76  Ht 6' (1.829 m)  Wt 88.905 kg (196 lb)  BMI 26.58 kg/m2 He is an elderly white male in no acute distress. HEENT: Normal No JVD, adenopathy, thyromegaly, or bruits. Lungs: Clear  Cardiovascular: Regular rate and rhythm normal S1 and S2 with a grade 2/6 systolic ejection murmur the right upper sternal border. There is an early diastolic murmur grade 1/6. Abdomen: Obese, soft,  nontender. Well healed midline surgical scar. No masses or bruits. Extremities: Normal femoral and pedal pulses. No cyanosis or edema. Skin: Warm and dry. Neuro: Alert and oriented x3. Cranial nerves II through XII are intact. No focal findings.  LABORATORY DATA:  Ecg today shows NSR with occ. PVC. LBBB. I have personally reviewed and interpreted this study.    Assessment / Plan: 1.  Chronic aortic insufficiency moderate by recent Echo.  2. Coronary disease with remote inferior posterior myocardial infarction. Chronic occlusion of the distal left circumflex. He is asymptomatic and functioning at a high level for his age. Continue medical therapy and follow up in one year.   3. COPD with mild pulmonary fibrosis.  4. Hypertension. Controlled.  5. Abdominal aortic aneurysm status post repair. 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 Location argues for conservative management. Stable by CT July 2015.  7. Chronic systolic CHF with ischemic cardiomyopathy. EF by Echo 25-30%. Euvolemic. On ACEi. Felt to be a poor candidate for beta blocker due to lung disease.

## 2014-11-11 ENCOUNTER — Other Ambulatory Visit: Payer: Self-pay | Admitting: Cardiology

## 2015-01-03 ENCOUNTER — Encounter: Payer: Self-pay | Admitting: Family Medicine

## 2015-01-03 ENCOUNTER — Ambulatory Visit (INDEPENDENT_AMBULATORY_CARE_PROVIDER_SITE_OTHER): Payer: Commercial Managed Care - HMO | Admitting: Family Medicine

## 2015-01-03 VITALS — BP 128/82 | HR 73 | Temp 98.4°F | Wt 198.0 lb

## 2015-01-03 DIAGNOSIS — I25118 Atherosclerotic heart disease of native coronary artery with other forms of angina pectoris: Secondary | ICD-10-CM

## 2015-01-03 NOTE — Assessment & Plan Note (Addendum)
S: Patient complains of chest pain in the center of his chest last week. He thought it was his reflux and took extra tums which did not help. He also took his omeprazole. He does admit he realized this did not feel like his reflux later. Pain would be sharp lasting for a few minutes then would ease off. He states he had some mild shortness of breath each time as well as some numbness going into his left arm. He states the last time he felt similar pain was in 2001 when he had a catheterization. He has a hard time stating what he was doing when episodes occurred but he had several a day for 3-4 days then they ended on their own. Never took nitroglcyerin A/P: We attempted to get patient worked into cardiology this week but unable to get appointment until next week. Fortunately, chest pain has resolved and not recurred in last 3 weeks. i strongly doubt  Reflux. Did not think EKG likely helpful given known LBBB and no active chest pain. Suspect patient will need repeat stress testing. Would also ask cards opinion on PSK9 inhibitor since patient has failed multiple attempts on statins and has allergy listed (patient was not fasting to update lipids).  Advised patient if recurrent issues, to proceed to the emergency room if not relieved by nitro (he should trial this first). Will also forward note to Dr. Martinique

## 2015-01-03 NOTE — Progress Notes (Signed)
Garret Reddish, MD  Subjective:  [name] is a 81 y.o. year old very pleasant male patient who presents for/with See problem oriented charting ROS- Fell after things improved- hit r side, Varying levels of discomfort. weedeating in ditch in front of house and stepped on rock the wrong way. Some mucus production. SHortness of breath and chest pain as noted.   Past Medical History-  Patient Active Problem List   Diagnosis Date Noted  . Migraine without aura 04/07/2014    Priority: High  . Chronic systolic CHF (congestive heart failure)- EF 25-30%     Priority: High  . AAA (abdominal aortic aneurysm) 09/18/2011    Priority: High  . COPD (chronic obstructive pulmonary disease)     Priority: High  . Coronary atherosclerosis 10/09/2006    Priority: High  . Insomnia 04/07/2014    Priority: Medium  . Dysphagia 07/22/2013    Priority: Medium  . Aortic regurgitation 07/24/2010    Priority: Medium  . Pulmonary fibrosis 04/24/2007    Priority: Medium  . BPH (benign prostatic hyperplasia) 10/09/2006    Priority: Medium  . Essential hypertension 10/08/2006    Priority: Medium  . CKD (chronic kidney disease), stage III 10/08/2006    Priority: Medium  . Muscle spasm of left upper back 10/18/2014    Priority: Low  . Constipation 05/10/2014    Priority: Low  . GERD (gastroesophageal reflux disease) 04/07/2014    Priority: Low  . History of colonic polyps 07/22/2013    Priority: Low  . Thrombocytopenia 09/14/2012    Priority: Low  . LBBB (left bundle branch block) 12/20/2009    Priority: Low  . ERECTILE DYSFUNCTION 12/12/2009    Priority: Low  . Peripheral vascular disease 10/09/2006    Priority: Low    Medications- reviewed and updated Current Outpatient Prescriptions  Medication Sig Dispense Refill  . acetaminophen (TYLENOL) 325 MG tablet Take 650 mg by mouth every 6 (six) hours as needed.    Marland Kitchen aspirin EC 81 MG tablet Take 81 mg by mouth daily.    . beta carotene w/minerals  (OCUVITE) tablet Take 1 tablet by mouth daily.    . finasteride (PROSCAR) 5 MG tablet Take 5 mg by mouth daily.    . Garlic 2 MG CAPS Take 1 capsule by mouth daily.    Marland Kitchen lisinopril (PRINIVIL,ZESTRIL) 40 MG tablet TAKE 1 TABLET EVERY DAY  ( APPOINTMENT IS NEEDED  FOR  FURTHER  REFILLS ) 90 tablet 4  . Magnesium 100 MG CAPS Take 1 capsule by mouth daily.    . Melatonin 5 MG TABS Take 1 tablet by mouth daily as needed.    . Multiple Vitamin (MULTIVITAMIN) capsule Take 1 capsule by mouth daily.    Marland Kitchen omeprazole (PRILOSEC) 20 MG capsule TAKE 1 CAPSULE TWICE A DAY 180 capsule 3  . Phenylephrine-Acetaminophen 5-325 MG CAPS Take by mouth.    . tiotropium (SPIRIVA) 18 MCG inhalation capsule Place 18 mcg into inhaler and inhale daily.    Marland Kitchen LORazepam (ATIVAN) 1 MG tablet Take 1 mg by mouth daily as needed for anxiety.    . nitroGLYCERIN (NITROSTAT) 0.4 MG SL tablet Place 1 tablet (0.4 mg total) under the tongue every 5 (five) minutes as needed for chest pain. (Patient not taking: Reported on 01/03/2015) 25 tablet 6  . orphenadrine (NORFLEX) 100 MG tablet Take 1 tablet (100 mg total) by mouth 2 (two) times daily as needed for muscle spasms. (Patient not taking: Reported on 01/03/2015) 30  tablet 0  . zolpidem (AMBIEN) 5 MG tablet Take 5 mg by mouth at bedtime as needed for sleep.     No current facility-administered medications for this visit.    Objective: BP 128/82 mmHg  Pulse 73  Temp(Src) 98.4 F (36.9 C)  Wt 198 lb (89.812 kg) Gen: NAD, resting comfortably CV: RRR no murmurs rubs or gallops Mild pain over left rib (site of fall contact) Lungs: CTAB no crackles, wheeze, rhonchi Abdomen: soft/nontender/nondistended/normal bowel sounds. No rebound or guarding.  Ext: no edema Skin: warm, dry, no rash Neuro: grossly normal, moves all extremities  Assessment/Plan:  Coronary atherosclerosis S: Patient complains of chest pain in the center of his chest last week. He thought it was his reflux and  took extra tums which did not help. He also took his omeprazole. He does admit he realized this did not feel like his reflux later. Pain would be sharp lasting for a few minutes then would ease off. He states he had some mild shortness of breath each time as well as some numbness going into his left arm. He states the last time he felt similar pain was in 2001 when he had a catheterization. He has a hard time stating what he was doing when episodes occurred but he had several a day for 3-4 days then they ended on their own. Never took nitroglcyerin A/P: We attempted to get patient worked into cardiology this week but unable to get appointment until next week. Fortunately, chest pain has resolved. Did not think EKG likely helpful given known LBBB and no active chest pain. Suspect patient will need repeat stress testing. Would also ask cards opinion on PSK9 inhibitor since patient has failed multiple attempts on statins and has allergy listed (patient was not fasting to update lipids).  Advised patient if recurrent issues, to proceed to the emergency room if not relieved by nitro (he should trial this first). Will also forward note to Dr. Martinique

## 2015-01-03 NOTE — Patient Instructions (Addendum)
[name] is going to get you set up with cardiology hopefully before the end of the week.   I think you need at least a stress test and potentially repeat catheterization. I do not feel comfortable attributing this to reflux  I think it would benefit you to be on cholesterol medication if we can find a way or there are new injectables that cardiology can discuss with you  If you have recurrent issues, encourage you to go to the emergency room

## 2015-01-12 ENCOUNTER — Ambulatory Visit (INDEPENDENT_AMBULATORY_CARE_PROVIDER_SITE_OTHER): Payer: Commercial Managed Care - HMO | Admitting: Cardiology

## 2015-01-12 ENCOUNTER — Encounter: Payer: Self-pay | Admitting: Cardiology

## 2015-01-12 VITALS — BP 138/74 | HR 77 | Ht 73.0 in | Wt 198.1 lb

## 2015-01-12 DIAGNOSIS — J438 Other emphysema: Secondary | ICD-10-CM

## 2015-01-12 DIAGNOSIS — R079 Chest pain, unspecified: Secondary | ICD-10-CM

## 2015-01-12 DIAGNOSIS — I2510 Atherosclerotic heart disease of native coronary artery without angina pectoris: Secondary | ICD-10-CM

## 2015-01-12 DIAGNOSIS — I5022 Chronic systolic (congestive) heart failure: Secondary | ICD-10-CM

## 2015-01-12 DIAGNOSIS — I10 Essential (primary) hypertension: Secondary | ICD-10-CM

## 2015-01-12 DIAGNOSIS — I714 Abdominal aortic aneurysm, without rupture, unspecified: Secondary | ICD-10-CM

## 2015-01-12 NOTE — Progress Notes (Signed)
Cardiology Office Note   Date:  01/12/2015   ID:  [name], DOB 04-Jul-1930, MRN [id number]  PCP:  Garret Reddish, MD  Cardiologist:  Dr. Martinique    Chief Complaint  Patient presents with  . Chest Pain    PCP referral - patient reports he has acid reflux but this pain hurts worse and lasts longer than his usual reflux; hasn't had pain in over 1 week and doubled up on prilosec; complains of shortness of breath that seemed worse with this pain; fell about 2 weeks ago and hit chest and this caused difficulty breathing      History of Present Illness: [name] is a 81 y.o. male who presents for CAD with new chest pain.  Pain began about 1 month ago and would come and go.  Associated with SOB, no nausea or diaphoresis. He also has hx of GERD but this was different he also had some lt arm numbness.   Usually with GERD he drinks pepsi and belches and pain improves, this time it did not.  He saw his PCP and they requested he be seen by cardiology.  No pain in last couple of months.   His history includes an inferior posterior myocardial infarction in May of 2000. He had stenting of the distal circumflex. Repeat cardiac catheterization in 2001 demonstrated that the stent was occluded and the vessel filled by right to left collaterals.   He had a history of ruptured abdominal aortic aneurysm in 1999. This was treated with emergency surgery. 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 This has been evaluated by vascular surgery here as well as by Dr. Sammuel Hines in [facility]. Conservative followup has been recommended. Last CT one year ago in [place] showed no change.     Past Medical History  Diagnosis Date  . Kidney tumor (benign)   . Chronic headache   . Hypertension   . Nephrolithiasis   . Renal insufficiency   . Peripheral vascular disease   . BPH (benign prostatic hypertrophy)   . CAD (coronary artery disease)   . Detached retina   . Asthma   . Allergic  rhinitis   . Sleep apnea   . Pulmonary fibrosis Jan 2009    very subtle possible fibrosis noted on CT Jan 2009.  Unchanged since Oct 2006. Deemed possibly due to chlorine exposure at gym work swimming pool  . COPD (chronic obstructive pulmonary disease)     2001 PFTs  - FEv1 2.5L/66%, DLCO 57% -> March 2009: Fev1 1.91L/625, Ratio 50, DLCO 50%  . AAA (abdominal aortic aneurysm)     Rupture 16 years ago  . History of colonic diverticulitis   . Aneurysm, common iliac artery   . Myocardial infarction     minor in 2000, 2001  . Aortic insufficiency   . Chronic systolic CHF (congestive heart failure)   . Cholelithiasis 09/14/2012  . Pulmonary nodule, left 10/27/2010    #Pulmonary nodule Left Lower Lobe  - CT 2009 present  - CXR 2012 - not present  - Patient prefers clinical followup after weighing options    . NEPHROLITHIASIS, HX OF 10/08/2006    Qualifier: Diagnosis of  By: Leanne Chang MD, Bruce    . PULMONARY HYPERTENSION 06/02/2007    Qualifier: Diagnosis of  By: Chase Caller MD, Murali  Echo 07/31/10: no mention of pulmonary htn  Tricuspid valve: Structurally normal valve. Leaflet separation was normal. Doppler: Transvalvular velocity was within the normal  range. Mild regurgitation.     . TESTOSTERONE DEFICIENCY 02/11/2007    Qualifier: Diagnosis of  By: Leanne Chang MD, Bruce      Past Surgical History  Procedure Laterality Date  . Nephrectomy  2003     left partial, benign tumor  . Vasectomy    . Ptca  2001 and 2002  . Abdominal aortic aneurysm repair  1997  . Cataract extraction      bilateral  . Cardiac catheterization  07/22/02    EF 55%  . Abdominal aortic aneurysm repair    . Eye surgery    . Esophagogastroduodenoscopy N/A 09/15/2012    Procedure: ESOPHAGOGASTRODUODENOSCOPY (EGD);  Surgeon: Lafayette Dragon, MD;  Location: [facility] ENDOSCOPY;  Service: Endoscopy;  Laterality: N/A;  . Esophagogastroduodenoscopy N/A 09/09/2013    Procedure: ESOPHAGOGASTRODUODENOSCOPY (EGD);  Surgeon: Inda Castle, MD;   Location: [facility] ENDOSCOPY;  Service: Endoscopy;  Laterality: N/A;  talk to robin ,had no opening for mac at this time could not put a mac case in dr. Paulita Fujita day .Shirlean Mylar said to put it as moderate sed.  jacqueline aiken,if something come available will contac robin  . Savory dilation N/A 09/09/2013    Procedure: SAVORY DILATION;  Surgeon: Inda Castle, MD;  Location: [facility] ENDOSCOPY;  Service: Endoscopy;  Laterality: N/A;     Current Outpatient Prescriptions  Medication Sig Dispense Refill  . acetaminophen (TYLENOL) 325 MG tablet Take 650 mg by mouth every 6 (six) hours as needed.    Marland Kitchen aspirin EC 81 MG tablet Take 81 mg by mouth daily.    . beta carotene w/minerals (OCUVITE) tablet Take 1 tablet by mouth daily.    . finasteride (PROSCAR) 5 MG tablet Take 5 mg by mouth daily.    . Garlic 2 MG CAPS Take 1 capsule by mouth daily.    Marland Kitchen ibuprofen (ADVIL,MOTRIN) 800 MG tablet Take 1 tablet by mouth every 8 (eight) hours as needed.  3  . lisinopril (PRINIVIL,ZESTRIL) 40 MG tablet TAKE 1 TABLET EVERY DAY  ( APPOINTMENT IS NEEDED  FOR  FURTHER  REFILLS ) 90 tablet 4  . LORazepam (ATIVAN) 1 MG tablet Take 1 mg by mouth daily as needed for anxiety.    . Magnesium 100 MG CAPS Take 1 capsule by mouth daily.    . Melatonin 5 MG TABS Take 1 tablet by mouth daily as needed.    . Multiple Vitamin (MULTIVITAMIN) capsule Take 1 capsule by mouth daily.    . nitroGLYCERIN (NITROSTAT) 0.4 MG SL tablet Place 1 tablet (0.4 mg total) under the tongue every 5 (five) minutes as needed for chest pain. 25 tablet 6  . omeprazole (PRILOSEC) 20 MG capsule TAKE 1 CAPSULE TWICE A DAY 180 capsule 3  . orphenadrine (NORFLEX) 100 MG tablet Take 1 tablet (100 mg total) by mouth 2 (two) times daily as needed for muscle spasms. 30 tablet 0  . tiotropium (SPIRIVA) 18 MCG inhalation capsule Place 18 mcg into inhaler and inhale daily.    Marland Kitchen zolpidem (AMBIEN) 5 MG tablet Take 5 mg by mouth at bedtime as needed for sleep.     No current  facility-administered medications for this visit.    Allergies:   Beta adrenergic blockers and Statins    Social History:  The patient  reports that he quit smoking about 17 years ago. His smoking use included Cigarettes. He has a 50 pack-year smoking history. He has never used smokeless tobacco. He reports that he does not drink alcohol  or use illicit drugs.   Family History:  The patient's family history includes Aneurysm (age of onset: 58) in his father; Hypertension in his mother; Other in his father and mother.    ROS:  General:no colds or fevers, no weight changes Skin:no rashes or ulcers HEENT:no blurred vision, no congestion CV:see HPI PUL:see HPI GI:no diarrhea constipation or melena, + indigestion GU:no hematuria, no dysuria MS:no joint pain, no claudication Neuro:no syncope, no lightheadedness Endo:no diabetes, no thyroid disease  Wt Readings from Last 3 Encounters:  01/12/15 198 lb 1.6 oz (89.858 kg)  01/03/15 198 lb (89.812 kg)  11/08/14 196 lb (88.905 kg)     PHYSICAL EXAM: VS:  BP 138/74 mmHg  Pulse 77  Ht 6\' 1"  (1.854 m)  Wt 198 lb 1.6 oz (89.858 kg)  BMI 26.14 kg/m2 , BMI Body mass index is 26.14 kg/(m^2). General:Pleasant affect, NAD Skin:Warm and dry, brisk capillary refill HEENT:normocephalic, sclera clear, mucus membranes moist Neck:supple, no JVD, no bruits  Heart:S1S2 RRR with 2/6systolic murmur,no gallup, rub or click Lungs:clear without rales, rhonchi, or wheezes UKG:URKY, non tender, + BS, do not palpate liver spleen or masses Ext:no lower ext edema, 2+ pedal pulses, 2+ radial pulses Neuro:alert and oriented X 3, MAE, follows commands, + facial symmetry    EKG:  EKG is ordered today. The ekg ordered today demonstrates SR 1st degree av block HR 77 LBBB no acute changes.   Recent Labs: No results found for requested labs within last 365 days.    Lipid Panel    Component Value Date/Time   CHOL 154 04/10/2011 1116   TRIG 182.0*  04/10/2011 1116   HDL 29.60* 04/10/2011 1116   CHOLHDL 5 04/10/2011 1116   VLDL 36.4 04/10/2011 1116   LDLCALC 88 04/10/2011 1116       Other studies Reviewed: Additional studies/ records that were reviewed today include: previous notes, PCP note.Marland Kitchen ECHO 2013: Study Conclusions  - Left ventricle: The cavity size was moderately dilated. There was mild focal basal hypertrophy of the septum. Systolic function was severely reduced. The estimated ejection fraction was in the range of 25% to 30%. Diffuse hypokinesis. There is akinesis of the inferior myocardium. Doppler parameters are consistent with abnormal left ventricular relaxation (grade 1 diastolic dysfunction). - Aortic valve: Moderate regurgitation. - Aortic root: The aortic root was moderately dilated. - Ascending aorta: The ascending aorta was moderately dilated. - Mitral valve: Mild regurgitation. - Left atrium: The atrium was mildly dilated. - Right atrium: The atrium was mildly dilated. - Pulmonary arteries: Systolic pressure was mildly increased. PA peak pressure: 98mm Hg (S). Impressions:  - Ascending aorta appears to be moderately dilated; suggest CTA or MRA to better assess.  NUC 2013 EF 35%old inf infarct but no ischemia.    ASSESSMENT AND PLAN:  1.  Chest pain-- for about 1 month. LBBB on EKG no acute changes,  Will do lexiscan myoview.   2. Coronary disease with remote inferior posterior myocardial infarction. Chronic occlusion of the distal left circumflex.   3. . Chronic aortic insufficiency moderate by recent Echo.  4. COPD with mild pulmonary fibrosis.  5. Hypertension. Controlled.  6. Abdominal aortic aneurysm status post repair. 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 Location argues for conservative management. Stable by CT July 2015.  7. Chronic systolic CHF with ischemic cardiomyopathy. EF by Echo 25-30%. Euvolemic. On ACEi. Felt to be a poor candidate for beta blocker due to lung  disease.   8. Hyperlipidemia with intolerance to statins, once nuc  results back will refer to Pharmacy here for PSK9          He will follow up in 3-4 weeks with APP   Current medicines are reviewed with the patient today.  The patient Has no concerns regarding medicines.  The following changes have been made:  See above Labs/ tests ordered today include:see above  Disposition:   FU:  see above  Lennie Muckle, NP  01/12/2015 9:46 PM    [facility] [place], [place], [place], [place] Phone: [phone]; Fax: [phone]

## 2015-01-12 NOTE — Patient Instructions (Signed)
Your physician has requested that you have a lexiscan myoview in next 1-2 weeks. For further information please visit [email or web address]. Please follow instruction sheet, as given.  Your physician recommends that you schedule a follow-up appointment in: 3-4 weeks with NP or PA

## 2015-01-20 ENCOUNTER — Telehealth (HOSPITAL_COMMUNITY): Payer: Self-pay

## 2015-01-20 NOTE — Telephone Encounter (Signed)
Encounter complete. 

## 2015-01-25 ENCOUNTER — Ambulatory Visit (HOSPITAL_COMMUNITY)
Admission: RE | Admit: 2015-01-25 | Discharge: 2015-01-25 | Disposition: A | Discharge status: Home / Self Care | Payer: Commercial Managed Care - HMO | Source: Ambulatory Visit | Attending: Urology | Admitting: Urology

## 2015-01-25 DIAGNOSIS — R079 Chest pain, unspecified: Secondary | ICD-10-CM

## 2015-01-26 ENCOUNTER — Telehealth (HOSPITAL_COMMUNITY): Payer: Self-pay

## 2015-01-26 NOTE — Telephone Encounter (Signed)
Encounter complete. 

## 2015-01-28 ENCOUNTER — Ambulatory Visit (HOSPITAL_COMMUNITY)
Admission: RE | Admit: 2015-01-28 | Discharge: 2015-01-28 | Disposition: A | Discharge status: Home / Self Care | Payer: Commercial Managed Care - HMO | Source: Ambulatory Visit | Attending: Cardiology | Admitting: Cardiology

## 2015-01-28 DIAGNOSIS — Z87891 Personal history of nicotine dependence: Secondary | ICD-10-CM | POA: Diagnosis not present

## 2015-01-28 DIAGNOSIS — R9439 Abnormal result of other cardiovascular function study: Secondary | ICD-10-CM | POA: Insufficient documentation

## 2015-01-28 DIAGNOSIS — R079 Chest pain, unspecified: Secondary | ICD-10-CM | POA: Diagnosis not present

## 2015-01-28 DIAGNOSIS — R0609 Other forms of dyspnea: Secondary | ICD-10-CM | POA: Diagnosis not present

## 2015-01-28 DIAGNOSIS — I10 Essential (primary) hypertension: Secondary | ICD-10-CM | POA: Diagnosis not present

## 2015-01-28 DIAGNOSIS — I517 Cardiomegaly: Secondary | ICD-10-CM | POA: Insufficient documentation

## 2015-01-28 DIAGNOSIS — Z8249 Family history of ischemic heart disease and other diseases of the circulatory system: Secondary | ICD-10-CM | POA: Diagnosis not present

## 2015-01-28 DIAGNOSIS — R0602 Shortness of breath: Secondary | ICD-10-CM | POA: Insufficient documentation

## 2015-01-28 DIAGNOSIS — I447 Left bundle-branch block, unspecified: Secondary | ICD-10-CM | POA: Insufficient documentation

## 2015-01-28 LAB — MYOCARDIAL PERFUSION IMAGING
CHL CUP NUCLEAR SDS: 0
CHL CUP NUCLEAR SRS: 12
CHL CUP RESTING HR STRESS: 67 {beats}/min
LV dias vol: 299 mL
LV sys vol: 225 mL
Peak HR: 90 {beats}/min
SSS: 12
TID: 1.13

## 2015-01-28 MED ORDER — REGADENOSON 0.4 MG/5ML IV SOLN
0.4000 mg | Freq: Once | INTRAVENOUS | Status: AC
  Administered 2015-01-28: 0.4 mg via INTRAVENOUS

## 2015-01-28 MED ORDER — AMINOPHYLLINE 25 MG/ML IV SOLN
75.0000 mg | Freq: Once | INTRAVENOUS | Status: AC
  Administered 2015-01-28: 75 mg via INTRAVENOUS

## 2015-01-28 MED ORDER — TECHNETIUM TC 99M SESTAMIBI GENERIC - CARDIOLITE
30.7000 | Freq: Once | INTRAVENOUS | Status: AC | PRN
  Administered 2015-01-28: 30.7 via INTRAVENOUS

## 2015-01-28 MED ORDER — TECHNETIUM TC 99M SESTAMIBI GENERIC - CARDIOLITE
10.0000 | Freq: Once | INTRAVENOUS | Status: AC | PRN
Start: 2015-01-28 — End: 2015-01-28
  Administered 2015-01-28: 10 via INTRAVENOUS

## 2015-01-31 ENCOUNTER — Other Ambulatory Visit: Payer: Self-pay | Admitting: Family Medicine

## 2015-02-01 ENCOUNTER — Other Ambulatory Visit: Payer: Self-pay

## 2015-02-01 DIAGNOSIS — R079 Chest pain, unspecified: Secondary | ICD-10-CM

## 2015-02-01 DIAGNOSIS — I10 Essential (primary) hypertension: Secondary | ICD-10-CM

## 2015-02-01 DIAGNOSIS — R9439 Abnormal result of other cardiovascular function study: Secondary | ICD-10-CM

## 2015-02-01 NOTE — Telephone Encounter (Signed)
Yes thanks 

## 2015-02-01 NOTE — Telephone Encounter (Signed)
Refill ok? 

## 2015-02-02 ENCOUNTER — Telehealth: Payer: Self-pay | Admitting: Physician Assistant

## 2015-02-02 NOTE — Telephone Encounter (Signed)
Pt is going to have a Cath on this Friday. He wants to know if still need the appointment on [id number] with Rosaria Ferries?

## 2015-02-02 NOTE — Telephone Encounter (Signed)
Spoke with patient who reports his OV on 10/18 was to review his stress test but he has already gotten the results and has his cath scheduled for 02/04/15. 10/18 appointment cancelled as he already has a follow up cath OV made for 02/23/15

## 2015-02-03 ENCOUNTER — Ambulatory Visit
Admission: RE | Admit: 2015-02-03 | Discharge: 2015-02-03 | Disposition: A | Discharge status: Home / Self Care | Payer: Commercial Managed Care - HMO | Source: Ambulatory Visit | Attending: Cardiology | Admitting: Cardiology

## 2015-02-03 ENCOUNTER — Other Ambulatory Visit: Payer: Self-pay | Admitting: Cardiology

## 2015-02-03 DIAGNOSIS — R079 Chest pain, unspecified: Secondary | ICD-10-CM

## 2015-02-03 DIAGNOSIS — I10 Essential (primary) hypertension: Secondary | ICD-10-CM

## 2015-02-03 DIAGNOSIS — R9439 Abnormal result of other cardiovascular function study: Secondary | ICD-10-CM

## 2015-02-03 LAB — BASIC METABOLIC PANEL
BUN: 19 mg/dL (ref 7–25)
CALCIUM: 9.2 mg/dL (ref 8.6–10.3)
CO2: 23 mmol/L (ref 20–31)
Chloride: 106 mmol/L (ref 98–110)
Creat: 1.27 mg/dL — ABNORMAL HIGH (ref 0.70–1.11)
GLUCOSE: 87 mg/dL (ref 65–99)
Potassium: 4.6 mmol/L (ref 3.5–5.3)
SODIUM: 142 mmol/L (ref 135–146)

## 2015-02-04 ENCOUNTER — Ambulatory Visit (HOSPITAL_COMMUNITY)
Admission: RE | Admit: 2015-02-04 | Discharge: 2015-02-04 | Disposition: A | Discharge status: Home / Self Care | Payer: Commercial Managed Care - HMO | Source: Ambulatory Visit | Attending: Cardiology | Admitting: Cardiology

## 2015-02-04 ENCOUNTER — Encounter (HOSPITAL_COMMUNITY)
Admission: RE | Disposition: A | Discharge status: Home / Self Care | Payer: Self-pay | Source: Ambulatory Visit | Attending: Cardiology

## 2015-02-04 DIAGNOSIS — K219 Gastro-esophageal reflux disease without esophagitis: Secondary | ICD-10-CM | POA: Diagnosis not present

## 2015-02-04 DIAGNOSIS — R9439 Abnormal result of other cardiovascular function study: Secondary | ICD-10-CM | POA: Diagnosis present

## 2015-02-04 DIAGNOSIS — I5022 Chronic systolic (congestive) heart failure: Secondary | ICD-10-CM | POA: Insufficient documentation

## 2015-02-04 DIAGNOSIS — Z87442 Personal history of urinary calculi: Secondary | ICD-10-CM | POA: Diagnosis not present

## 2015-02-04 DIAGNOSIS — I447 Left bundle-branch block, unspecified: Secondary | ICD-10-CM | POA: Diagnosis not present

## 2015-02-04 DIAGNOSIS — I272 Other secondary pulmonary hypertension: Secondary | ICD-10-CM | POA: Insufficient documentation

## 2015-02-04 DIAGNOSIS — J449 Chronic obstructive pulmonary disease, unspecified: Secondary | ICD-10-CM | POA: Diagnosis not present

## 2015-02-04 DIAGNOSIS — I739 Peripheral vascular disease, unspecified: Secondary | ICD-10-CM | POA: Insufficient documentation

## 2015-02-04 DIAGNOSIS — Z7982 Long term (current) use of aspirin: Secondary | ICD-10-CM | POA: Insufficient documentation

## 2015-02-04 DIAGNOSIS — I2510 Atherosclerotic heart disease of native coronary artery without angina pectoris: Secondary | ICD-10-CM | POA: Diagnosis not present

## 2015-02-04 DIAGNOSIS — J45909 Unspecified asthma, uncomplicated: Secondary | ICD-10-CM | POA: Insufficient documentation

## 2015-02-04 DIAGNOSIS — N400 Enlarged prostate without lower urinary tract symptoms: Secondary | ICD-10-CM | POA: Diagnosis not present

## 2015-02-04 DIAGNOSIS — I714 Abdominal aortic aneurysm, without rupture: Secondary | ICD-10-CM | POA: Diagnosis not present

## 2015-02-04 DIAGNOSIS — G4730 Sleep apnea, unspecified: Secondary | ICD-10-CM | POA: Diagnosis not present

## 2015-02-04 DIAGNOSIS — I10 Essential (primary) hypertension: Secondary | ICD-10-CM | POA: Diagnosis present

## 2015-02-04 DIAGNOSIS — I110 Hypertensive heart disease with heart failure: Secondary | ICD-10-CM | POA: Insufficient documentation

## 2015-02-04 DIAGNOSIS — Z87891 Personal history of nicotine dependence: Secondary | ICD-10-CM | POA: Diagnosis not present

## 2015-02-04 DIAGNOSIS — E785 Hyperlipidemia, unspecified: Secondary | ICD-10-CM | POA: Diagnosis not present

## 2015-02-04 DIAGNOSIS — N183 Chronic kidney disease, stage 3 unspecified: Secondary | ICD-10-CM | POA: Diagnosis present

## 2015-02-04 DIAGNOSIS — J8410 Pulmonary fibrosis, unspecified: Secondary | ICD-10-CM | POA: Diagnosis not present

## 2015-02-04 DIAGNOSIS — I252 Old myocardial infarction: Secondary | ICD-10-CM | POA: Insufficient documentation

## 2015-02-04 DIAGNOSIS — I255 Ischemic cardiomyopathy: Secondary | ICD-10-CM | POA: Insufficient documentation

## 2015-02-04 DIAGNOSIS — I351 Nonrheumatic aortic (valve) insufficiency: Secondary | ICD-10-CM | POA: Diagnosis not present

## 2015-02-04 DIAGNOSIS — I2582 Chronic total occlusion of coronary artery: Secondary | ICD-10-CM | POA: Diagnosis not present

## 2015-02-04 DIAGNOSIS — N289 Disorder of kidney and ureter, unspecified: Secondary | ICD-10-CM | POA: Diagnosis not present

## 2015-02-04 HISTORY — PX: CARDIAC CATHETERIZATION: SHX172

## 2015-02-04 LAB — PROTIME-INR
INR: 1.02 (ref <1.50)
Prothrombin Time: 13.5 seconds (ref 11.6–15.2)

## 2015-02-04 LAB — CBC WITH DIFFERENTIAL/PLATELET
BASOS ABS: 0 10*3/uL (ref 0.0–0.1)
Basophils Relative: 0 % (ref 0–1)
Eosinophils Absolute: 0.1 10*3/uL (ref 0.0–0.7)
Eosinophils Relative: 2 % (ref 0–5)
HEMATOCRIT: 46.1 % (ref 39.0–52.0)
HEMOGLOBIN: 15.7 g/dL (ref 13.0–17.0)
LYMPHS ABS: 1.9 10*3/uL (ref 0.7–4.0)
LYMPHS PCT: 25 % (ref 12–46)
MCH: 30.5 pg (ref 26.0–34.0)
MCHC: 34.1 g/dL (ref 30.0–36.0)
MCV: 89.7 fL (ref 78.0–100.0)
MONO ABS: 0.9 10*3/uL (ref 0.1–1.0)
MPV: 8.7 fL (ref 8.6–12.4)
Monocytes Relative: 12 % (ref 3–12)
NEUTROS ABS: 4.5 10*3/uL (ref 1.7–7.7)
Neutrophils Relative %: 61 % (ref 43–77)
Platelets: 126 10*3/uL — ABNORMAL LOW (ref 150–400)
RBC: 5.14 MIL/uL (ref 4.22–5.81)
RDW: 14.6 % (ref 11.5–15.5)
WBC: 7.4 10*3/uL (ref 4.0–10.5)

## 2015-02-04 LAB — POCT ACTIVATED CLOTTING TIME
ACTIVATED CLOTTING TIME: 183 s
Activated Clotting Time: 165 seconds

## 2015-02-04 SURGERY — LEFT HEART CATH AND CORONARY ANGIOGRAPHY
Anesthesia: LOCAL

## 2015-02-04 MED ORDER — HEPARIN SODIUM (PORCINE) 1000 UNIT/ML IJ SOLN
INTRAMUSCULAR | Status: AC
  Filled 2015-02-04: qty 1

## 2015-02-04 MED ORDER — HEPARIN (PORCINE) IN NACL 2-0.9 UNIT/ML-% IJ SOLN
INTRAMUSCULAR | Status: AC
  Filled 2015-02-04: qty 1000

## 2015-02-04 MED ORDER — SODIUM CHLORIDE 0.9 % IV SOLN
INTRAVENOUS | Status: DC
  Administered 2015-02-04: 07:00:00 via INTRAVENOUS

## 2015-02-04 MED ORDER — IOHEXOL 350 MG/ML SOLN
INTRAVENOUS | Status: DC | PRN
  Administered 2015-02-04: 90 mL via INTRA_ARTERIAL

## 2015-02-04 MED ORDER — SODIUM CHLORIDE 0.9 % IV SOLN: 250.0000 mL | INTRAVENOUS | Status: DC | PRN

## 2015-02-04 MED ORDER — FENTANYL CITRATE (PF) 100 MCG/2ML IJ SOLN
INTRAMUSCULAR | Status: AC
  Filled 2015-02-04: qty 4

## 2015-02-04 MED ORDER — FENTANYL CITRATE (PF) 100 MCG/2ML IJ SOLN
INTRAMUSCULAR | Status: DC | PRN
  Administered 2015-02-04: 25 ug via INTRAVENOUS

## 2015-02-04 MED ORDER — VERAPAMIL HCL 2.5 MG/ML IV SOLN
INTRAVENOUS | Status: AC
  Filled 2015-02-04: qty 2

## 2015-02-04 MED ORDER — SODIUM CHLORIDE 0.9 % IJ SOLN: 3.0000 mL | INTRAMUSCULAR | Status: DC | PRN

## 2015-02-04 MED ORDER — SODIUM CHLORIDE 0.9 % IJ SOLN: 3.0000 mL | Freq: Two times a day (BID) | INTRAMUSCULAR | Status: DC

## 2015-02-04 MED ORDER — MIDAZOLAM HCL 2 MG/2ML IJ SOLN
INTRAMUSCULAR | Status: AC
  Filled 2015-02-04: qty 4

## 2015-02-04 MED ORDER — MIDAZOLAM HCL 2 MG/2ML IJ SOLN
INTRAMUSCULAR | Status: DC | PRN
  Administered 2015-02-04: 1 mg via INTRAVENOUS

## 2015-02-04 MED ORDER — ASPIRIN 81 MG PO CHEW
81.0000 mg | CHEWABLE_TABLET | ORAL | Status: AC
  Administered 2015-02-04: 81 mg via ORAL

## 2015-02-04 MED ORDER — LIDOCAINE HCL (PF) 1 % IJ SOLN
INTRAMUSCULAR | Status: DC | PRN
  Administered 2015-02-04: 08:00:00

## 2015-02-04 MED ORDER — HEPARIN SODIUM (PORCINE) 1000 UNIT/ML IJ SOLN
INTRAMUSCULAR | Status: DC | PRN
  Administered 2015-02-04: 4500 [IU] via INTRAVENOUS

## 2015-02-04 MED ORDER — HEPARIN (PORCINE) IN NACL 2-0.9 UNIT/ML-% IJ SOLN
INTRAMUSCULAR | Status: AC
  Filled 2015-02-04: qty 500

## 2015-02-04 MED ORDER — ASPIRIN 81 MG PO CHEW
CHEWABLE_TABLET | ORAL | Status: AC
  Administered 2015-02-04: 81 mg via ORAL
  Filled 2015-02-04: qty 1

## 2015-02-04 MED ORDER — SODIUM CHLORIDE 0.9 % WEIGHT BASED INFUSION: 3.0000 mL/kg/h | INTRAVENOUS | Status: AC

## 2015-02-04 MED ORDER — LIDOCAINE HCL (PF) 1 % IJ SOLN
INTRAMUSCULAR | Status: AC
  Filled 2015-02-04: qty 30

## 2015-02-04 MED ORDER — VERAPAMIL HCL 2.5 MG/ML IV SOLN
INTRAVENOUS | Status: DC | PRN
  Administered 2015-02-04: 08:00:00 via INTRA_ARTERIAL

## 2015-02-04 SURGICAL SUPPLY — 17 items
CATH EXPO 5F MPA-1 (CATHETERS) ×1 IMPLANT
CATH INFINITI 5 FR JL3.5 (CATHETERS) ×2 IMPLANT
CATH INFINITI 5 FR JL6.0 (CATHETERS) ×1 IMPLANT
CATH INFINITI 5F PIG 125CM (CATHETERS) ×1 IMPLANT
CATH INFINITI 5FR ANG PIGTAIL (CATHETERS) ×2 IMPLANT
CATH INFINITI JR4 5F (CATHETERS) ×2 IMPLANT
DEVICE RAD COMP TR BAND LRG (VASCULAR PRODUCTS) ×2 IMPLANT
GLIDESHEATH SLEND SS 6F .021 (SHEATH) ×2 IMPLANT
KIT HEART LEFT (KITS) ×2 IMPLANT
PACK CARDIAC CATHETERIZATION (CUSTOM PROCEDURE TRAY) ×2 IMPLANT
SHEATH PINNACLE 5F 10CM (SHEATH) ×1 IMPLANT
SYR MEDRAD MARK V 150ML (SYRINGE) ×1 IMPLANT
TRANSDUCER W/STOPCOCK (MISCELLANEOUS) ×2 IMPLANT
TUBING CIL FLEX 10 FLL-RA (TUBING) ×2 IMPLANT
WIRE EMERALD 3MM-J .035X150CM (WIRE) ×1 IMPLANT
WIRE ROSEN-J .035X260CM (WIRE) ×1 IMPLANT
WIRE SAFE-T 1.5MM-J .035X260CM (WIRE) ×2 IMPLANT

## 2015-02-04 NOTE — H&P (View-Only) (Signed)
Cardiology Office Note   Date:  01/12/2015   ID:  [name], DOB 08-29-1930, MRN [id number]  PCP:  Garret Reddish, MD  Cardiologist:  Dr. Martinique    Chief Complaint  Patient presents with  . Chest Pain    PCP referral - patient reports he has acid reflux but this pain hurts worse and lasts longer than his usual reflux; hasn't had pain in over 1 week and doubled up on prilosec; complains of shortness of breath that seemed worse with this pain; fell about 2 weeks ago and hit chest and this caused difficulty breathing      History of Present Illness: [name] is a 81 y.o. male who presents for CAD with new chest pain.  Pain began about 1 month ago and would come and go.  Associated with SOB, no nausea or diaphoresis. He also has hx of GERD but this was different he also had some lt arm numbness.   Usually with GERD he drinks pepsi and belches and pain improves, this time it did not.  He saw his PCP and they requested he be seen by cardiology.  No pain in last couple of months.   His history includes an inferior posterior myocardial infarction in May of 2000. He had stenting of the distal circumflex. Repeat cardiac catheterization in 2001 demonstrated that the stent was occluded and the vessel filled by right to left collaterals.   He had a history of ruptured abdominal aortic aneurysm in 1999. This was treated with emergency surgery. 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 This has been evaluated by vascular surgery here as well as by Dr. Sammuel Hines in [facility]. Conservative followup has been recommended. Last CT one year ago in [place] showed no change.     Past Medical History  Diagnosis Date  . Kidney tumor (benign)   . Chronic headache   . Hypertension   . Nephrolithiasis   . Renal insufficiency   . Peripheral vascular disease   . BPH (benign prostatic hypertrophy)   . CAD (coronary artery disease)   . Detached retina   . Asthma   . Allergic  rhinitis   . Sleep apnea   . Pulmonary fibrosis Jan 2009    very subtle possible fibrosis noted on CT Jan 2009.  Unchanged since Oct 2006. Deemed possibly due to chlorine exposure at gym work swimming pool  . COPD (chronic obstructive pulmonary disease)     2001 PFTs  - FEv1 2.5L/66%, DLCO 57% -> March 2009: Fev1 1.91L/625, Ratio 50, DLCO 50%  . AAA (abdominal aortic aneurysm)     Rupture 16 years ago  . History of colonic diverticulitis   . Aneurysm, common iliac artery   . Myocardial infarction     minor in 2000, 2001  . Aortic insufficiency   . Chronic systolic CHF (congestive heart failure)   . Cholelithiasis 09/14/2012  . Pulmonary nodule, left 10/27/2010    #Pulmonary nodule Left Lower Lobe  - CT 2009 present  - CXR 2012 - not present  - Patient prefers clinical followup after weighing options    . NEPHROLITHIASIS, HX OF 10/08/2006    Qualifier: Diagnosis of  By: Leanne Chang MD, Bruce    . PULMONARY HYPERTENSION 06/02/2007    Qualifier: Diagnosis of  By: Chase Caller MD, Murali  Echo 07/31/10: no mention of pulmonary htn  Tricuspid valve: Structurally normal valve. Leaflet separation was normal. Doppler: Transvalvular velocity was within the normal  range. Mild regurgitation.     . TESTOSTERONE DEFICIENCY 02/11/2007    Qualifier: Diagnosis of  By: Leanne Chang MD, Bruce      Past Surgical History  Procedure Laterality Date  . Nephrectomy  2003     left partial, benign tumor  . Vasectomy    . Ptca  2001 and 2002  . Abdominal aortic aneurysm repair  1997  . Cataract extraction      bilateral  . Cardiac catheterization  07/22/02    EF 55%  . Abdominal aortic aneurysm repair    . Eye surgery    . Esophagogastroduodenoscopy N/A 09/15/2012    Procedure: ESOPHAGOGASTRODUODENOSCOPY (EGD);  Surgeon: Lafayette Dragon, MD;  Location[facility] ENDOSCOPY;  Service: Endoscopy;  Laterality: N/A;  . Esophagogastroduodenoscopy N/A 09/09/2013    Procedure: ESOPHAGOGASTRODUODENOSCOPY (EGD);  Surgeon: Inda Castle, MD;   Location: [facility] ENDOSCOPY;  Service: Endoscopy;  Laterality: N/A;  talk to robin ,had no opening for mac at this time could not put a mac case in dr. Paulita Fujita day .Shirlean Mylar said to put it as moderate sed.  jacqueline aiken,if something come available will contac robin  . Savory dilation N/A 09/09/2013    Procedure: SAVORY DILATION;  Surgeon: Inda Castle, MD;  Location: [facility] ENDOSCOPY;  Service: Endoscopy;  Laterality: N/A;     Current Outpatient Prescriptions  Medication Sig Dispense Refill  . acetaminophen (TYLENOL) 325 MG tablet Take 650 mg by mouth every 6 (six) hours as needed.    Marland Kitchen aspirin EC 81 MG tablet Take 81 mg by mouth daily.    . beta carotene w/minerals (OCUVITE) tablet Take 1 tablet by mouth daily.    . finasteride (PROSCAR) 5 MG tablet Take 5 mg by mouth daily.    . Garlic 2 MG CAPS Take 1 capsule by mouth daily.    Marland Kitchen ibuprofen (ADVIL,MOTRIN) 800 MG tablet Take 1 tablet by mouth every 8 (eight) hours as needed.  3  . lisinopril (PRINIVIL,ZESTRIL) 40 MG tablet TAKE 1 TABLET EVERY DAY  ( APPOINTMENT IS NEEDED  FOR  FURTHER  REFILLS ) 90 tablet 4  . LORazepam (ATIVAN) 1 MG tablet Take 1 mg by mouth daily as needed for anxiety.    . Magnesium 100 MG CAPS Take 1 capsule by mouth daily.    . Melatonin 5 MG TABS Take 1 tablet by mouth daily as needed.    . Multiple Vitamin (MULTIVITAMIN) capsule Take 1 capsule by mouth daily.    . nitroGLYCERIN (NITROSTAT) 0.4 MG SL tablet Place 1 tablet (0.4 mg total) under the tongue every 5 (five) minutes as needed for chest pain. 25 tablet 6  . omeprazole (PRILOSEC) 20 MG capsule TAKE 1 CAPSULE TWICE A DAY 180 capsule 3  . orphenadrine (NORFLEX) 100 MG tablet Take 1 tablet (100 mg total) by mouth 2 (two) times daily as needed for muscle spasms. 30 tablet 0  . tiotropium (SPIRIVA) 18 MCG inhalation capsule Place 18 mcg into inhaler and inhale daily.    Marland Kitchen zolpidem (AMBIEN) 5 MG tablet Take 5 mg by mouth at bedtime as needed for sleep.     No current  facility-administered medications for this visit.    Allergies:   Beta adrenergic blockers and Statins    Social History:  The patient  reports that he quit smoking about 17 years ago. His smoking use included Cigarettes. He has a 50 pack-year smoking history. He has never used smokeless tobacco. He reports that he does not drink alcohol  or use illicit drugs.   Family History:  The patient's family history includes Aneurysm (age of onset: 60) in his father; Hypertension in his mother; Other in his father and mother.    ROS:  General:no colds or fevers, no weight changes Skin:no rashes or ulcers HEENT:no blurred vision, no congestion CV:see HPI PUL:see HPI GI:no diarrhea constipation or melena, + indigestion GU:no hematuria, no dysuria MS:no joint pain, no claudication Neuro:no syncope, no lightheadedness Endo:no diabetes, no thyroid disease  Wt Readings from Last 3 Encounters:  01/12/15 198 lb 1.6 oz (89.858 kg)  01/03/15 198 lb (89.812 kg)  11/08/14 196 lb (88.905 kg)     PHYSICAL EXAM: VS:  BP 138/74 mmHg  Pulse 77  Ht 6\' 1"  (1.854 m)  Wt 198 lb 1.6 oz (89.858 kg)  BMI 26.14 kg/m2 , BMI Body mass index is 26.14 kg/(m^2). General:Pleasant affect, NAD Skin:Warm and dry, brisk capillary refill HEENT:normocephalic, sclera clear, mucus membranes moist Neck:supple, no JVD, no bruits  Heart:S1S2 RRR with 2/6systolic murmur,no gallup, rub or click Lungs:clear without rales, rhonchi, or wheezes HBZ:JIRC, non tender, + BS, do not palpate liver spleen or masses Ext:no lower ext edema, 2+ pedal pulses, 2+ radial pulses Neuro:alert and oriented X 3, MAE, follows commands, + facial symmetry    EKG:  EKG is ordered today. The ekg ordered today demonstrates SR 1st degree av block HR 77 LBBB no acute changes.   Recent Labs: No results found for requested labs within last 365 days.    Lipid Panel    Component Value Date/Time   CHOL 154 04/10/2011 1116   TRIG 182.0*  04/10/2011 1116   HDL 29.60* 04/10/2011 1116   CHOLHDL 5 04/10/2011 1116   VLDL 36.4 04/10/2011 1116   LDLCALC 88 04/10/2011 1116       Other studies Reviewed: Additional studies/ records that were reviewed today include: previous notes, PCP note.Marland Kitchen ECHO 2013: Study Conclusions  - Left ventricle: The cavity size was moderately dilated. There was mild focal basal hypertrophy of the septum. Systolic function was severely reduced. The estimated ejection fraction was in the range of 25% to 30%. Diffuse hypokinesis. There is akinesis of the inferior myocardium. Doppler parameters are consistent with abnormal left ventricular relaxation (grade 1 diastolic dysfunction). - Aortic valve: Moderate regurgitation. - Aortic root: The aortic root was moderately dilated. - Ascending aorta: The ascending aorta was moderately dilated. - Mitral valve: Mild regurgitation. - Left atrium: The atrium was mildly dilated. - Right atrium: The atrium was mildly dilated. - Pulmonary arteries: Systolic pressure was mildly increased. PA peak pressure: 98mm Hg (S). Impressions:  - Ascending aorta appears to be moderately dilated; suggest CTA or MRA to better assess.  NUC 2013 EF 35%old inf infarct but no ischemia.    ASSESSMENT AND PLAN:  1.  Chest pain-- for about 1 month. LBBB on EKG no acute changes,  Will do lexiscan myoview.   2. Coronary disease with remote inferior posterior myocardial infarction. Chronic occlusion of the distal left circumflex.   3. . Chronic aortic insufficiency moderate by recent Echo.  4. COPD with mild pulmonary fibrosis.  5. Hypertension. Controlled.  6. Abdominal aortic aneurysm status post repair. 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 Location argues for conservative management. Stable by CT July 2015.  7. Chronic systolic CHF with ischemic cardiomyopathy. EF by Echo 25-30%. Euvolemic. On ACEi. Felt to be a poor candidate for beta blocker due to lung  disease.   8. Hyperlipidemia with intolerance to statins, once nuc  results back will refer to Pharmacy here for PSK9          He will follow up in 3-4 weeks with APP   Current medicines are reviewed with the patient today.  The patient Has no concerns regarding medicines.  The following changes have been made:  See above Labs/ tests ordered today include:see above  Disposition:   FU:  see above  Lennie Muckle, NP  01/12/2015 9:46 PM    [facility] [place], [place], [place], [place] Phone: [phone]; Fax: [phone]

## 2015-02-04 NOTE — Discharge Instructions (Signed)
Radial Site Care Refer to this sheet in the next few weeks. These instructions provide you with information about caring for yourself after your procedure. Your health care provider may also give you more specific instructions. Your treatment has been planned according to current medical practices, but problems sometimes occur. Call your health care provider if you have any problems or questions after your procedure. WHAT TO EXPECT AFTER THE PROCEDURE After your procedure, it is typical to have the following:  Bruising at the radial site that usually fades within 1-2 weeks.  Blood collecting in the tissue (hematoma) that may be painful to the touch. It should usually decrease in size and tenderness within 1-2 weeks. HOME CARE INSTRUCTIONS  Take medicines only as directed by your health care provider.  You may shower 24-48 hours after the procedure or as directed by your health care provider. Remove the bandage (dressing) and gently wash the site with plain soap and water. Pat the area dry with a clean towel. Do not rub the site, because this may cause bleeding.  Do not take baths, swim, or use a hot tub until your health care provider approves.  Check your insertion site every day for redness, swelling, or drainage.  Do not apply powder or lotion to the site.  Do not flex or bend the affected arm for 24 hours or as directed by your health care provider.  Do not push or pull heavy objects with the affected arm for 24 hours or as directed by your health care provider.  Do not lift over 10 lb (4.5 kg) for 5 days after your procedure or as directed by your health care provider.  Ask your health care provider when it is okay to:  Return to work or school.  Resume usual physical activities or sports.  Resume sexual activity.  Do not drive home if you are discharged the same day as the procedure. Have someone else drive you.  You may drive 24 hours after the procedure unless otherwise  instructed by your health care provider.  Do not operate machinery or power tools for 24 hours after the procedure.  If your procedure was done as an outpatient procedure, which means that you went home the same day as your procedure, a responsible adult should be with you for the first 24 hours after you arrive home.  Keep all follow-up visits as directed by your health care provider. This is important. SEEK MEDICAL CARE IF:  You have a fever.  You have chills.  You have increased bleeding from the radial site. Hold pressure on the site and call 911. SEEK IMMEDIATE MEDICAL CARE IF:  You have unusual pain at the radial site.  You have redness, warmth, or swelling at the radial site.  You have drainage (other than a small amount of blood on the dressing) from the radial site.  The radial site is bleeding, and the bleeding does not stop after 30 minutes of holding steady pressure on the site.  Your arm or hand becomes pale, cool, tingly, or numb.   This information is not intended to replace advice given to you by your health care provider. Make sure you discuss any questions you have with your health care provider.   Document Released: 05/12/2010 Document Revised: 04/30/2014 Document Reviewed: 10/26/2013 Elsevier Interactive Patient Education 2016 [place] After Refer to this sheet in the next few weeks. These instructions provide you with information about caring for yourself after your  procedure. Your health care provider may also give you more specific instructions. Your treatment has been planned according to current medical practices, but problems sometimes occur. Call your health care provider if you have any problems or questions after your procedure. WHAT TO EXPECT AFTER THE PROCEDURE After your procedure, it is typical to have the following:  Bruising at the catheter insertion site that usually fades within 1-2 weeks.  Blood collecting in the  tissue (hematoma) that may be painful to the touch. It should usually decrease in size and tenderness within 1-2 weeks. HOME CARE INSTRUCTIONS  Take medicines only as directed by your health care provider.  You may shower 24-48 hours after the procedure or as directed by your health care provider. Remove the bandage (dressing) and gently wash the site with plain soap and water. Pat the area dry with a clean towel. Do not rub the site, because this may cause bleeding.  Do not take baths, swim, or use a hot tub until your health care provider approves.  Check your insertion site every day for redness, swelling, or drainage.  Do not apply powder or lotion to the site.  Do not lift over 10 lb (4.5 kg) for 5 days after your procedure or as directed by your health care provider.  Ask your health care provider when it is okay to:  Return to work or school.  Resume usual physical activities or sports.  Resume sexual activity.  Do not drive home if you are discharged the same day as the procedure. Have someone else drive you.  You may drive 24 hours after the procedure unless otherwise instructed by your health care provider.  Do not operate machinery or power tools for 24 hours after the procedure or as directed by your health care provider.  If your procedure was done as an outpatient procedure, which means that you went home the same day as your procedure, a responsible adult should be with you for the first 24 hours after you arrive home.  Keep all follow-up visits as directed by your health care provider. This is important. SEEK MEDICAL CARE IF:  You have a fever.  You have chills.  You have increased bleeding from the catheter insertion site. Hold pressure on the site and call 911. SEEK IMMEDIATE MEDICAL CARE IF:  You have unusual pain at the catheter insertion site.  You have redness, warmth, or swelling at the catheter insertion site.  You have drainage (other than a small  amount of blood on the dressing) from the catheter insertion site.  The catheter insertion site is bleeding, and the bleeding does not stop after 30 minutes of holding steady pressure on the site.  The area near or just beyond the catheter insertion site becomes pale, cool, tingly, or numb.   This information is not intended to replace advice given to you by your health care provider. Make sure you discuss any questions you have with your health care provider.   Document Released: 10/26/2004 Document Revised: 04/30/2014 Document Reviewed: 09/10/2012 Elsevier Interactive Patient Education [employer].

## 2015-02-04 NOTE — Progress Notes (Signed)
Site area: right groin  Site Prior to Removal:  Level 0 Pressure Applied For: 20 Manual:   Yes Debbie RN  Patient Status During Pull:  stable Post Pull Site:  Level 0 Post Pull Instructions Given:  yes Post Pull Pulses Present: dp +2 pt +1 Dressing Applied: occlusive  Bedrest begins @ 0920 Comments: resting comfortably

## 2015-02-04 NOTE — Interval H&P Note (Signed)
History and Physical Interval Note:  02/04/2015 7:33 AM  Clyde Lundborg Wordell  has presented today for surgery, with the diagnosis of c/p  The various methods of treatment have been discussed with the patient and family. After consideration of risks, benefits and other options for treatment, the patient has consented to  Procedure(s): Left Heart Cath and Coronary Angiography (N/A) as a surgical intervention .  The patient's history has been reviewed, patient examined, no change in status, stable for surgery.  I have reviewed the patient's chart and labs.  Questions were answered to the patient's satisfaction.   Cath Lab Visit (complete for each Cath Lab visit)  Clinical Evaluation Leading to the Procedure:   ACS: No.  Non-ACS:    Anginal Classification: CCS II  Anti-ischemic medical therapy: No Therapy  Non-Invasive Test Results: High-risk stress test findings: cardiac mortality >3%/year  Prior CABG: No previous CABG        Collier [facility] 02/04/2015 7:33 AM

## 2015-02-07 ENCOUNTER — Encounter (HOSPITAL_COMMUNITY): Payer: Self-pay | Admitting: Cardiology

## 2015-02-08 ENCOUNTER — Ambulatory Visit: Payer: Commercial Managed Care - HMO | Admitting: Physician Assistant

## 2015-02-21 NOTE — Progress Notes (Signed)
Cardiology Office Note   Date:  02/21/2015   ID:  [name], DOB 1930/05/31, MRN [id number]  PCP:  Garret Reddish, MD  Cardiologist:  Dr. Martinique    Post cath follow up   History of Present Illness: [name] is a 81 y.o. 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  His history includes an inferior posterior myocardial infarction in May of 2000. He had stenting of the distal circumflex. Repeat cardiac catheterization in 2001 demonstrated that the stent was occluded and the vessel filled by right to left collaterals. He had a history of ruptured abdominal aortic aneurysm in 1997. This was treated with emergency surgery. 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 This has been evaluated by vascular surgery here as well as by Dr. Sammuel Hines in [facility]. Conservative followup has been recommended. Last CT one year ago in [place] showed no change.   He was seen in the clinic by Cecilie Kicks NP on 01/12/15 for evaluation of chest pain. She set him up for nuclear stress test which was performed on 01/28/15 and reported large size, moderate intensity fixed septal defect - may represent scar or LBBB artifact. Severely dilated ventricle with global hypokinesis, LVEF 25%. Dr Martinique reviewed his scan and recommended that he proceed with coronary angiography. He presented to [facility] on [facility]04[facility]15 for outpatient cath which revealed single vessel obstructive disease with chronic occlusion of the mid LCx with collaterals. Marked aneurysmal disease of the RCA and first OM. These findings are chronic. Normal LV EDP.   Today he presents for post cath follow up. He is still pretty active outside and has been limited  in what he can do [name] to shortness of breath. No chest pain but does have some occasional GERD that is relieved by sipping on Pepsi. He has chronic orthopnea for years and sleeps in a recliner. He denies LE edema or PND. He does wake up feeling hot all over and thinking he is going to die, but not SOB. He has had this for years and doesn't know what causes it. It only happens occasionally.    Past Medical History  Diagnosis Date  . Kidney tumor (benign)   . Chronic headache   . Hypertension   . Nephrolithiasis   . Renal insufficiency   . Peripheral vascular disease ([place])   . BPH (benign prostatic hypertrophy)   . CAD (coronary artery disease)   . Detached retina   . Asthma   . Allergic rhinitis   . Sleep apnea   . Pulmonary fibrosis [facility]) Jan 2009    very subtle possible fibrosis noted on CT Jan 2009.  Unchanged since Oct 2006. Deemed possibly [name] to chlorine exposure at gym work swimming pool  . COPD (chronic obstructive pulmonary disease) ([place])     2001 PFTs  - FEv1 2.5L/66%, DLCO 57% -> March 2009: Fev1 1.91L/625, Ratio 50, DLCO 50%  . AAA (abdominal aortic aneurysm) ([facility])     Rupture 16 years ago  . History of colonic diverticulitis   . Aneurysm, common iliac artery ([place])   . Myocardial infarction ([place])     minor in 2000, 2001  . Aortic insufficiency   .  Chronic systolic CHF (congestive heart failure) ([place])   . Cholelithiasis 09/14/2012  . Pulmonary nodule, left 10/27/2010    #Pulmonary nodule Left Lower Lobe  - CT 2009 present  - CXR 2012 - not present  - Patient prefers clinical followup after weighing options    . NEPHROLITHIASIS, HX OF 10/08/2006    Qualifier: Diagnosis of  By: Leanne Chang MD, Bruce    . PULMONARY HYPERTENSION 06/02/2007    Qualifier: Diagnosis of  By: Chase Caller MD, Murali  Echo 07/31/10: no mention of pulmonary htn  Tricuspid valve: Structurally normal valve. Leaflet separation was normal. Doppler: Transvalvular velocity was within the normal range. Mild regurgitation.      . TESTOSTERONE DEFICIENCY 02/11/2007    Qualifier: Diagnosis of  By: Leanne Chang MD, Bruce      Past Surgical History  Procedure Laterality Date  . Nephrectomy  2003     left partial, benign tumor  . Vasectomy    . Ptca  2001 and 2002  . Abdominal aortic aneurysm repair  1997  . Cataract extraction      bilateral  . Cardiac catheterization  07/22/02    EF 55%  . Abdominal aortic aneurysm repair    . Eye surgery    . Esophagogastroduodenoscopy N/A 09/15/2012    Procedure: ESOPHAGOGASTRODUODENOSCOPY (EGD);  Surgeon: Lafayette Dragon, MD;  Location: [facility] ENDOSCOPY;  Service: Endoscopy;  Laterality: N/A;  . Esophagogastroduodenoscopy N/A 09/09/2013    Procedure: ESOPHAGOGASTRODUODENOSCOPY (EGD);  Surgeon: Inda Castle, MD;  Location: [facility] ENDOSCOPY;  Service: Endoscopy;  Laterality: N/A;  talk to robin ,had no opening for mac at this time could not put a mac case in dr. Paulita Fujita day .Shirlean Mylar said to put it as moderate sed.  jacqueline aiken,if something come available will contac robin  . Savory dilation N/A 09/09/2013    Procedure: SAVORY DILATION;  Surgeon: Inda Castle, MD;  Location: [facility] ENDOSCOPY;  Service: Endoscopy;  Laterality: N/A;  . Cardiac catheterization N/A 02/04/2015    Procedure: Left Heart Cath and Coronary Angiography;  Surgeon: Peter M Martinique, MD;  Location: [facility];  Service: Cardiovascular;  Laterality: N/A;     Current Outpatient Prescriptions  Medication Sig Dispense Refill  . acetaminophen (TYLENOL) 325 MG tablet Take 650 mg by mouth every 6 (six) hours as needed (pain).     Marland Kitchen aspirin EC 81 MG tablet Take 81 mg by mouth daily.    . beta carotene w/minerals (OCUVITE) tablet Take 1 tablet by mouth daily.    . finasteride (PROSCAR) 5 MG tablet Take 5 mg by mouth daily.    . Garlic 2 MG CAPS Take 1 capsule by mouth daily.    Marland Kitchen ibuprofen (ADVIL,MOTRIN) 800 MG tablet TAKE 1 TABLET BY MOUTH EVERY 8 HOURS AS NEEDED FOR MIGRAINE HEADACHES 30 tablet 0  . lisinopril  (PRINIVIL,ZESTRIL) 40 MG tablet TAKE 1 TABLET EVERY DAY  ( APPOINTMENT IS NEEDED  FOR  FURTHER  REFILLS ) 90 tablet 4  . LORazepam (ATIVAN) 1 MG tablet Take 1 mg by mouth daily as needed for anxiety.    . Magnesium 100 MG CAPS Take 1 capsule by mouth daily.    . Melatonin 5 MG TABS Take 1 tablet by mouth daily as needed (sleep).     . Multiple Vitamin (MULTIVITAMIN) capsule Take 1 capsule by mouth daily.    . nitroGLYCERIN (NITROSTAT) 0.4 MG SL tablet Place 1 tablet (0.4 mg total) under the tongue every 5 (five) minutes as needed for  chest pain. 25 tablet 6  . omeprazole (PRILOSEC) 20 MG capsule TAKE 1 CAPSULE TWICE A DAY 180 capsule 3  . orphenadrine (NORFLEX) 100 MG tablet Take 1 tablet (100 mg total) by mouth 2 (two) times daily as needed for muscle spasms. 30 tablet 0  . tiotropium (SPIRIVA) 18 MCG inhalation capsule Place 18 mcg into inhaler and inhale daily.    Marland Kitchen zolpidem (AMBIEN) 5 MG tablet TAKE 1 TABLET AT BEDTIME AS NEEDED (Patient taking differently: TAKE 1 TABLET AT BEDTIME AS NEEDED FOR SLEEP) 30 tablet 5   No current facility-administered medications for this visit.    Allergies:   Beta adrenergic blockers and Statins    Social History:  The patient  reports that he quit smoking about 17 years ago. His smoking use included Cigarettes. He has a 50 pack-year smoking history. He has never used smokeless tobacco. He reports that he does not drink alcohol or use illicit drugs.   Family History:  The patient's family history includes Aneurysm (age of onset: 54) in his father; Hypertension in his mother; Other in his father and mother.    ROS:  Please see the history of present illness.   Otherwise, review of systems are positive for none..   All other systems are reviewed and negative.    PHYSICAL EXAM: VS:  There were no vitals taken for this visit. , BMI There is no weight on file to calculate BMI. GEN: Well nourished, well developed, in no acute distress HEENT: normal Neck: no  JVD, carotid bruits, or masses Cardiac: RRR; ++ murmur, rubs, or gallops,no edema  Respiratory:  clear to auscultation bilaterally, normal work of breathing GI: soft, nontender, nondistended, + BS MS: no deformity or atrophy Skin: warm and dry, no rash Neuro:  Strength and sensation are intact Psych: euthymic mood, full affect   EKG:  EKG is not ordered today.    Recent Labs: 02/01/2015: BUN 19; Creat 1.27*; Hemoglobin 15.7; Platelets 126*; Potassium 4.6; Sodium 142     Wt Readings from Last 3 Encounters:  02/04/15 198 lb (89.812 kg)  01/28/15 198 lb (89.812 kg)  01/12/15 198 lb 1.6 oz (89.858 kg)      Other studies Reviewed: Additional studies/ records that were reviewed today include: LHC Review of the above records demonstrates:  LHC: 02/04/15: Prox Cx to Mid Cx lesion, 100% stenosed. 1. Single vessel obstructive disease with chronic occlusion of the mid LCx with collaterals. Marked aneurysmal disease of the RCA and first OM. These findings are chronic. 2. Normal LV EDP.    ASSESSMENT AND PLAN:  [name] is a 81 y.o. 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  CAD Chronic occlusion of the distal left circumflex. LHC by Dr. Martinique 02/04/15 with stable disease. Continue medical therapy. Continue ASA. Not a candidate for BB [name] to COPD and intolerant to statins.   Chronic systolic CHF with ischemic cardiomyopathy. EF by Echo 25-30%. Euvolemic. On ACEi. Felt to be a poor candidate for beta blocker [name] to lung disease. He does not take any lasix and has never had any clinical CHF. ?ICD candidate for chronically low EF <35? Will defer to Dr. Martinique   Hyperlipidemia with intolerance to  statins- He  has tried several different statins. He may be a PSK9 inhibitor candidate. Consider a lipid clinic appointment in the future. He does not want to take an additonal medicine right now because he is in the donut hole with Medicare and cannot pay for another medication currently.    Abdominal aortic aneurysm status post repair. 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 Location argues for conservative management. Stable by CT July 2015.  Chronic AR- moderate by recent Echo.  COPD with mild pulmonary fibrosis.  Hypertension. 124/64: Controlled.   Current medicines are reviewed at length with the patient today.  The patient does not have concerns regarding medicines.  The following changes have been made:  no change  Labs/ tests ordered today include:  No orders of the defined types were placed in this encounter.    Disposition:   FU with Dr. Martinique in 4-6 weeks or first available  Signed, Crista Luria  02/21/2015 7:31 PM    [address], [place], [place]  [address] Phone: [phone]; Fax: [phone]

## 2015-02-23 ENCOUNTER — Ambulatory Visit: Payer: Commercial Managed Care - HMO | Admitting: Nurse Practitioner

## 2015-02-24 ENCOUNTER — Encounter: Payer: Self-pay | Admitting: Physician Assistant

## 2015-02-24 ENCOUNTER — Ambulatory Visit (INDEPENDENT_AMBULATORY_CARE_PROVIDER_SITE_OTHER): Payer: Commercial Managed Care - HMO | Admitting: Physician Assistant

## 2015-02-24 VITALS — BP 124/64 | HR 76 | Ht 73.0 in | Wt 199.0 lb

## 2015-02-24 DIAGNOSIS — I2510 Atherosclerotic heart disease of native coronary artery without angina pectoris: Secondary | ICD-10-CM

## 2015-02-24 NOTE — Patient Instructions (Signed)
Your physician recommends that you schedule a follow-up appointment in: 4-6 weeks with Dr Martinique

## 2015-02-25 ENCOUNTER — Telehealth: Payer: Self-pay | Admitting: Internal Medicine

## 2015-02-25 MED ORDER — ALBUTEROL SULFATE HFA 108 (90 BASE) MCG/ACT IN AERS: 2.0000 | INHALATION_SPRAY | Freq: Four times a day (QID) | RESPIRATORY_TRACT | Status: AC | PRN

## 2015-02-25 NOTE — Telephone Encounter (Signed)
Called and spoke with patient.  He needed a refill on his rescue inhaler.  Patient is going out of town and could not schedule appointment today, but says that he will call back next week to schedule appointment.  Advised patient that we will refill it this one time since it is a rescue inhaler, but that he needs appointment to obtain any future refills.  Patient states he understands and says he will schedule appointment next week.  Nothing further needed. Closing encounter

## 2015-02-28 ENCOUNTER — Other Ambulatory Visit: Payer: Self-pay | Admitting: Family Medicine

## 2015-02-28 NOTE — Telephone Encounter (Signed)
Headache clinic notes did not mention ativan as treatment. He will need to return to headache clinic if he wants to continue this current treatment- very atypical migraine treatment. You may refer him. Can provide #10 pills to last him until that visit.

## 2015-02-28 NOTE — Telephone Encounter (Signed)
Refill ok? 

## 2015-03-22 ENCOUNTER — Telehealth: Payer: Self-pay | Admitting: Family Medicine

## 2015-03-22 MED ORDER — FINASTERIDE 5 MG PO TABS: 5.0000 mg | ORAL_TABLET | Freq: Every day | ORAL | Status: AC

## 2015-03-22 NOTE — Telephone Encounter (Signed)
Pt request refill of the following: finasteride (PROSCAR) 5 MG tablet   Phamacy: [facility] mail order

## 2015-03-22 NOTE — Telephone Encounter (Signed)
Medication refilled

## 2015-03-26 ENCOUNTER — Emergency Department (HOSPITAL_COMMUNITY): Payer: Commercial Managed Care - HMO

## 2015-03-26 ENCOUNTER — Encounter (HOSPITAL_COMMUNITY): Payer: Self-pay

## 2015-03-26 ENCOUNTER — Inpatient Hospital Stay (HOSPITAL_COMMUNITY)
Admission: EM | Admit: 2015-03-26 | Discharge: 2015-04-24 | DRG: 085 | Disposition: E | Payer: Commercial Managed Care - HMO | Attending: Surgery | Admitting: Surgery

## 2015-03-26 DIAGNOSIS — R402213 Coma scale, best verbal response, none, at hospital admission: Secondary | ICD-10-CM | POA: Diagnosis present

## 2015-03-26 DIAGNOSIS — Z7982 Long term (current) use of aspirin: Secondary | ICD-10-CM

## 2015-03-26 DIAGNOSIS — Z888 Allergy status to other drugs, medicaments and biological substances status: Secondary | ICD-10-CM

## 2015-03-26 DIAGNOSIS — K219 Gastro-esophageal reflux disease without esophagitis: Secondary | ICD-10-CM | POA: Diagnosis present

## 2015-03-26 DIAGNOSIS — W1839XA Other fall on same level, initial encounter: Secondary | ICD-10-CM | POA: Diagnosis present

## 2015-03-26 DIAGNOSIS — I5022 Chronic systolic (congestive) heart failure: Secondary | ICD-10-CM | POA: Diagnosis present

## 2015-03-26 DIAGNOSIS — S0003XA Contusion of scalp, initial encounter: Secondary | ICD-10-CM | POA: Diagnosis present

## 2015-03-26 DIAGNOSIS — R402313 Coma scale, best motor response, none, at hospital admission: Secondary | ICD-10-CM | POA: Diagnosis present

## 2015-03-26 DIAGNOSIS — I2510 Atherosclerotic heart disease of native coronary artery without angina pectoris: Secondary | ICD-10-CM | POA: Diagnosis present

## 2015-03-26 DIAGNOSIS — Z79899 Other long term (current) drug therapy: Secondary | ICD-10-CM

## 2015-03-26 DIAGNOSIS — Z9841 Cataract extraction status, right eye: Secondary | ICD-10-CM

## 2015-03-26 DIAGNOSIS — Y92007 Garden or yard of unspecified non-institutional (private) residence as the place of occurrence of the external cause: Secondary | ICD-10-CM

## 2015-03-26 DIAGNOSIS — Z905 Acquired absence of kidney: Secondary | ICD-10-CM

## 2015-03-26 DIAGNOSIS — W19XXXA Unspecified fall, initial encounter: Secondary | ICD-10-CM

## 2015-03-26 DIAGNOSIS — I252 Old myocardial infarction: Secondary | ICD-10-CM

## 2015-03-26 DIAGNOSIS — G936 Cerebral edema: Secondary | ICD-10-CM | POA: Diagnosis present

## 2015-03-26 DIAGNOSIS — N183 Chronic kidney disease, stage 3 (moderate): Secondary | ICD-10-CM | POA: Diagnosis present

## 2015-03-26 DIAGNOSIS — D696 Thrombocytopenia, unspecified: Secondary | ICD-10-CM | POA: Diagnosis present

## 2015-03-26 DIAGNOSIS — Z952 Presence of prosthetic heart valve: Secondary | ICD-10-CM

## 2015-03-26 DIAGNOSIS — S066X0A Traumatic subarachnoid hemorrhage without loss of consciousness, initial encounter: Secondary | ICD-10-CM | POA: Diagnosis not present

## 2015-03-26 DIAGNOSIS — E440 Moderate protein-calorie malnutrition: Secondary | ICD-10-CM | POA: Diagnosis present

## 2015-03-26 DIAGNOSIS — Q899 Congenital malformation, unspecified: Secondary | ICD-10-CM

## 2015-03-26 DIAGNOSIS — Z9861 Coronary angioplasty status: Secondary | ICD-10-CM

## 2015-03-26 DIAGNOSIS — N400 Enlarged prostate without lower urinary tract symptoms: Secondary | ICD-10-CM | POA: Diagnosis present

## 2015-03-26 DIAGNOSIS — J8410 Pulmonary fibrosis, unspecified: Secondary | ICD-10-CM | POA: Diagnosis present

## 2015-03-26 DIAGNOSIS — Z8249 Family history of ischemic heart disease and other diseases of the circulatory system: Secondary | ICD-10-CM

## 2015-03-26 DIAGNOSIS — J45909 Unspecified asthma, uncomplicated: Secondary | ICD-10-CM | POA: Diagnosis present

## 2015-03-26 DIAGNOSIS — S42292A Other displaced fracture of upper end of left humerus, initial encounter for closed fracture: Secondary | ICD-10-CM | POA: Diagnosis present

## 2015-03-26 DIAGNOSIS — R4689 Other symptoms and signs involving appearance and behavior: Secondary | ICD-10-CM

## 2015-03-26 DIAGNOSIS — G4730 Sleep apnea, unspecified: Secondary | ICD-10-CM | POA: Diagnosis present

## 2015-03-26 DIAGNOSIS — R000 Tachycardia, unspecified: Secondary | ICD-10-CM | POA: Diagnosis present

## 2015-03-26 DIAGNOSIS — J449 Chronic obstructive pulmonary disease, unspecified: Secondary | ICD-10-CM | POA: Diagnosis present

## 2015-03-26 DIAGNOSIS — Z4659 Encounter for fitting and adjustment of other gastrointestinal appliance and device: Secondary | ICD-10-CM

## 2015-03-26 DIAGNOSIS — G8191 Hemiplegia, unspecified affecting right dominant side: Secondary | ICD-10-CM | POA: Diagnosis present

## 2015-03-26 DIAGNOSIS — R319 Hematuria, unspecified: Secondary | ICD-10-CM | POA: Diagnosis present

## 2015-03-26 DIAGNOSIS — R911 Solitary pulmonary nodule: Secondary | ICD-10-CM | POA: Diagnosis present

## 2015-03-26 DIAGNOSIS — J9690 Respiratory failure, unspecified, unspecified whether with hypoxia or hypercapnia: Secondary | ICD-10-CM | POA: Diagnosis present

## 2015-03-26 DIAGNOSIS — G4700 Insomnia, unspecified: Secondary | ICD-10-CM | POA: Diagnosis present

## 2015-03-26 DIAGNOSIS — Z87891 Personal history of nicotine dependence: Secondary | ICD-10-CM

## 2015-03-26 DIAGNOSIS — I130 Hypertensive heart and chronic kidney disease with heart failure and stage 1 through stage 4 chronic kidney disease, or unspecified chronic kidney disease: Secondary | ICD-10-CM | POA: Diagnosis present

## 2015-03-26 DIAGNOSIS — Z66 Do not resuscitate: Secondary | ICD-10-CM | POA: Diagnosis present

## 2015-03-26 DIAGNOSIS — Z452 Encounter for adjustment and management of vascular access device: Secondary | ICD-10-CM

## 2015-03-26 DIAGNOSIS — Z8679 Personal history of other diseases of the circulatory system: Secondary | ICD-10-CM

## 2015-03-26 DIAGNOSIS — E785 Hyperlipidemia, unspecified: Secondary | ICD-10-CM | POA: Diagnosis present

## 2015-03-26 DIAGNOSIS — Z9842 Cataract extraction status, left eye: Secondary | ICD-10-CM

## 2015-03-26 DIAGNOSIS — R402113 Coma scale, eyes open, never, at hospital admission: Secondary | ICD-10-CM | POA: Diagnosis present

## 2015-03-26 DIAGNOSIS — M62838 Other muscle spasm: Secondary | ICD-10-CM | POA: Diagnosis present

## 2015-03-26 DIAGNOSIS — S43005A Unspecified dislocation of left shoulder joint, initial encounter: Secondary | ICD-10-CM | POA: Diagnosis present

## 2015-03-26 DIAGNOSIS — I619 Nontraumatic intracerebral hemorrhage, unspecified: Secondary | ICD-10-CM

## 2015-03-26 DIAGNOSIS — Z6825 Body mass index (BMI) 25.0-25.9, adult: Secondary | ICD-10-CM

## 2015-03-26 LAB — COMPREHENSIVE METABOLIC PANEL
ALK PHOS: 61 U/L (ref 38–126)
ALT: 15 U/L — AB (ref 17–63)
AST: 21 U/L (ref 15–41)
Albumin: 3.2 g/dL — ABNORMAL LOW (ref 3.5–5.0)
Anion gap: 7 (ref 5–15)
BILIRUBIN TOTAL: 0.9 mg/dL (ref 0.3–1.2)
BUN: 18 mg/dL (ref 6–20)
CALCIUM: 8.7 mg/dL — AB (ref 8.9–10.3)
CO2: 23 mmol/L (ref 22–32)
CREATININE: 1.19 mg/dL (ref 0.61–1.24)
Chloride: 110 mmol/L (ref 101–111)
GFR, EST NON AFRICAN AMERICAN: 55 mL/min — AB (ref >60)
Glucose, Bld: 161 mg/dL — ABNORMAL HIGH (ref 65–99)
Potassium: 4.4 mmol/L (ref 3.5–5.1)
SODIUM: 140 mmol/L (ref 135–145)
TOTAL PROTEIN: 5.5 g/dL — AB (ref 6.5–8.1)

## 2015-03-26 LAB — CBC WITH DIFFERENTIAL/PLATELET
BASOS ABS: 0 10*3/uL (ref 0.0–0.1)
BASOS PCT: 0 %
EOS ABS: 0 10*3/uL (ref 0.0–0.7)
Eosinophils Relative: 0 %
HEMATOCRIT: 43.9 % (ref 39.0–52.0)
HEMOGLOBIN: 14.9 g/dL (ref 13.0–17.0)
Lymphocytes Relative: 7 %
Lymphs Abs: 1.3 10*3/uL (ref 0.7–4.0)
MCH: 30.8 pg (ref 26.0–34.0)
MCHC: 33.9 g/dL (ref 30.0–36.0)
MCV: 90.7 fL (ref 78.0–100.0)
MONOS PCT: 6 %
Monocytes Absolute: 1.1 10*3/uL — ABNORMAL HIGH (ref 0.1–1.0)
NEUTROS PCT: 86 %
Neutro Abs: 15.3 10*3/uL — ABNORMAL HIGH (ref 1.7–7.7)
Platelets: 111 10*3/uL — ABNORMAL LOW (ref 150–400)
RBC: 4.84 MIL/uL (ref 4.22–5.81)
RDW: 13.8 % (ref 11.5–15.5)
WBC: 17.7 10*3/uL — ABNORMAL HIGH (ref 4.0–10.5)

## 2015-03-26 LAB — PROTIME-INR
INR: 1.21 (ref 0.00–1.49)
PROTHROMBIN TIME: 15.4 s — AB (ref 11.6–15.2)

## 2015-03-26 LAB — APTT: APTT: 30 s (ref 24–37)

## 2015-03-26 MED ORDER — IOHEXOL 300 MG/ML  SOLN
80.0000 mL | Freq: Once | INTRAMUSCULAR | Status: AC | PRN
  Administered 2015-03-26: 80 mL via INTRAVENOUS

## 2015-03-26 MED ORDER — HYDROMORPHONE HCL 1 MG/ML IJ SOLN
1.0000 mg | Freq: Once | INTRAMUSCULAR | Status: AC
  Administered 2015-03-26: 1 mg via INTRAVENOUS
  Filled 2015-03-26: qty 1

## 2015-03-26 NOTE — ED Notes (Signed)
Per EMS, pt fell at home while outside walking back from his car. Pt states that he tripped, denies dizziness. Pt hit the back of his head, no obvious injury to head, not on blood thinners. Pt fell onto left side and has obvious left humurus deformity and complains of left rib cage pain. Lung sounds clear. Pt alert and oriented x 4. Skin tear on the left arm. Good distal pulses of the left arm. Pt received 200mg  fentanyl and 4mg  of zofran in route.

## 2015-03-26 NOTE — ED Provider Notes (Signed)
Medical screening examination/treatment/procedure(s) were conducted as a shared visit with non-physician practitioner(s) and myself.  I personally evaluated the patient during the encounter.   EKG Interpretation None       See the written copy of this report in the patient's paper medical record.  These results did not interface directly into the electronic medical record and are summarized here.   81 yo M with a fall trying to get out of a car. Patient landed on his left side. Family unsure of loss of consciousness. Complaining of severe left shoulder pain. Mental status decreased after getting 200 mics of fentanyl en route from EMS. Patient having continued irritability while in the ED. Decision made to trauma scan. Patient having chest pain abdominal pain on my exam. CT scan of the head with 3 x 4 coronal hemorrhage in the left frontoparietal area. Patient not taking any blood thinners blood pressure less than [id number] systolic. Patient having worsening neurologic status over the past 20 or 30 minutes. Re-CT shows drastic increase of hemorrhage. Neurosurgery consult. Spoke with Dr. Janee Morn reviewed images feels that the location of this bleed is inoperable.  Will admit to trauma ICU  CRITICAL CARE Performed by: Cecilio Asper   Total critical care time: 85 minutes  Critical care time was exclusive of separately billable procedures and treating other patients.  Critical care was necessary to treat or prevent imminent or life-threatening deterioration.  Critical care was time spent personally by me on the following activities: development of treatment plan with patient and/or surrogate as well as nursing, discussions with consultants, evaluation of patient's response to treatment, examination of patient, obtaining history from patient or surrogate, ordering and performing treatments and interventions, ordering and review of laboratory studies, ordering and review of radiographic studies, pulse  oximetry and re-evaluation of patient's condition.  INTUBATION Performed by: Cecilio Asper  Required items: required blood products, implants, devices, and special equipment available Patient identity confirmed: provided demographic data and hospital-assigned identification number Time out: Immediately prior to procedure a "time out" was called to verify the correct patient, procedure, equipment, support staff and site/side marked as required.  Indications: airway protection  Intubation method: Glidescope Laryngoscopy   Preoxygenation: BVM  Sedatives: Etomidate Paralytic: Succinylcholine  Tube Size: 7.5 cuffed  Post-procedure assessment: chest rise and ETCO2 monitor Breath sounds: equal and absent over the epigastrium Tube secured with: ETT holder Chest x-ray interpreted by radiologist and me.  Chest x-ray findings: endotracheal tube in appropriate position  Patient tolerated the procedure well with no immediate complications.     Deno Etienne, DO 03/27/15 1459

## 2015-03-26 NOTE — ED Provider Notes (Signed)
CSN: [id number]     Arrival date & time 04/15/2015  2004 History   First MD Initiated Contact with Patient 04/17/2015 2018     Chief Complaint  Patient presents with  . Arm Injury     (Consider location/radiation/quality/duration/timing/severity/associated sxs/prior Treatment) HPI   KAMALI MENG is a 81 y.o. male  with significant PMH who presents to the Emergency Department after a functional fall where he missed his footing when getting out of the car. Per family at bedside, he fell and hit his head, but no LOC. On ASA but no other blood thinners. Was given 200 fentanyl and 4 of zofran by EMS PTA. Upon my initial exam, patient was very lethargic and had difficulty with following commands.   Level 5 caveat due to condition of patient.     Past Medical History  Diagnosis Date  . Kidney tumor (benign)   . Chronic headache   . Hypertension   . Nephrolithiasis   . Renal insufficiency   . Peripheral vascular disease ([place])   . BPH (benign prostatic hypertrophy)   . CAD (coronary artery disease)   . Detached retina   . Asthma   . Allergic rhinitis   . Sleep apnea   . Pulmonary fibrosis [facility]) Jan 2009    very subtle possible fibrosis noted on CT Jan 2009.  Unchanged since Oct 2006. Deemed possibly due to chlorine exposure at gym work swimming pool  . COPD (chronic obstructive pulmonary disease) ([place])     2001 PFTs  - FEv1 2.5L/66%, DLCO 57% -> March 2009: Fev1 1.91L/625, Ratio 50, DLCO 50%  . AAA (abdominal aortic aneurysm) ([facility])     Rupture 16 years ago  . History of colonic diverticulitis   . Aneurysm, common iliac artery ([place])   . Myocardial infarction ([place])     minor in 2000, 2001  . Aortic insufficiency   . Chronic systolic CHF (congestive heart failure) ([place])   . Cholelithiasis 09/14/2012  . Pulmonary nodule, left 10/27/2010    #Pulmonary nodule Left Lower Lobe  - CT 2009 present  - CXR 2012 - not present  - Patient prefers clinical followup after weighing options    .  NEPHROLITHIASIS, HX OF 10/08/2006    Qualifier: Diagnosis of  By: Leanne Chang MD, Bruce    . PULMONARY HYPERTENSION 06/02/2007    Qualifier: Diagnosis of  By: Chase Caller MD, Murali  Echo 07/31/10: no mention of pulmonary htn  Tricuspid valve: Structurally normal valve. Leaflet separation was normal. Doppler: Transvalvular velocity was within the normal range. Mild regurgitation.     . TESTOSTERONE DEFICIENCY 02/11/2007    Qualifier: Diagnosis of  By: Leanne Chang MD, Bruce    . HLD (hyperlipidemia) 01/22/2013   Past Surgical History  Procedure Laterality Date  . Nephrectomy  2003     left partial, benign tumor  . Vasectomy    . Ptca  2001 and 2002  . Abdominal aortic aneurysm repair  1997  . Cataract extraction      bilateral  . Cardiac catheterization  07/22/02    EF 55%  . Abdominal aortic aneurysm repair    . Eye surgery    . Esophagogastroduodenoscopy N/A 09/15/2012    Procedure: ESOPHAGOGASTRODUODENOSCOPY (EGD);  Surgeon: Lafayette Dragon, MD;  Location[facility] ENDOSCOPY;  Service: Endoscopy;  Laterality: N/A;  . Esophagogastroduodenoscopy N/A 09/09/2013    Procedure: ESOPHAGOGASTRODUODENOSCOPY (EGD);  Surgeon: Inda Castle, MD;  Location: [facility] ENDOSCOPY;  Service: Endoscopy;  Laterality: N/A;  talk to robin ,had  no opening for mac at this time could not put a mac case in dr. Paulita Fujita day .Shirlean Mylar said to put it as moderate sed.  jacqueline aiken,if something come available will contac robin  . Savory dilation N/A 09/09/2013    Procedure: SAVORY DILATION;  Surgeon: Inda Castle, MD;  Location: [facility] ENDOSCOPY;  Service: Endoscopy;  Laterality: N/A;  . Cardiac catheterization N/A 02/04/2015    Procedure: Left Heart Cath and Coronary Angiography;  Surgeon: Peter M Martinique, MD;  Location: [facility];  Service: Cardiovascular;  Laterality: N/A;   Family History  Problem Relation Age of Onset  . Aneurysm Father 38    deceased  . Other Father     AAA  . Hypertension Mother   . Other Mother      amputation (leg)   Social History  Substance Use Topics  . Smoking status: Former Smoker -- 1.00 packs/day for 50 years    Types: Cigarettes    Quit date: 04/23/1997  . Smokeless tobacco: Never Used  . Alcohol Use: No    Review of Systems  Unable to perform ROS: Acuity of condition      Allergies  Beta adrenergic blockers and Statins  Home Medications   Prior to Admission medications   Medication Sig Start Date End Date Taking? Authorizing Provider  acetaminophen (TYLENOL) 325 MG tablet Take 650 mg by mouth every 6 (six) hours as needed (pain).    Yes Historical Provider, MD  albuterol (PROVENTIL HFA;VENTOLIN HFA) 108 (90 BASE) MCG/ACT inhaler Inhale 2 puffs into the lungs every 6 (six) hours as needed for wheezing or shortness of breath. 02/25/15  Yes Brand Males, MD  aspirin EC 81 MG tablet Take 81 mg by mouth at bedtime.    Yes Historical Provider, MD  beta carotene w/minerals (OCUVITE) tablet Take 1 tablet by mouth daily.   Yes Historical Provider, MD  finasteride (PROSCAR) 5 MG tablet Take 1 tablet (5 mg total) by mouth daily. Patient taking differently: Take 5 mg by mouth every evening.  03/22/15  Yes Marin Olp, MD  Garlic 2 MG CAPS Take 1 capsule by mouth daily.   Yes Historical Provider, MD  ibuprofen (ADVIL,MOTRIN) 800 MG tablet TAKE 1 TABLET BY MOUTH EVERY 8 HOURS AS NEEDED FOR MIGRAINE HEADACHES 01/31/15  Yes Marin Olp, MD  lisinopril (PRINIVIL,ZESTRIL) 40 MG tablet TAKE 1 TABLET EVERY DAY  ( APPOINTMENT IS NEEDED  FOR  FURTHER  REFILLS ) 11/12/14  Yes Peter M Martinique, MD  LORazepam (ATIVAN) 1 MG tablet TAKE 1 TABLET EVERY DAY AS NEEDED FOR ANXIETY 03/01/15  Yes Marin Olp, MD  Magnesium 100 MG CAPS Take 1 capsule by mouth daily.   Yes Historical Provider, MD  Multiple Vitamin (MULTIVITAMIN) capsule Take 1 capsule by mouth daily.   Yes Historical Provider, MD  nitroGLYCERIN (NITROSTAT) 0.4 MG SL tablet Place 1 tablet (0.4 mg total) under the tongue  every 5 (five) minutes as needed for chest pain. 11/08/14  Yes Peter M Martinique, MD  omeprazole (PRILOSEC) 20 MG capsule TAKE 1 CAPSULE TWICE A DAY 10/04/14  Yes Inda Castle, MD  tiotropium (SPIRIVA) 18 MCG inhalation capsule Place 18 mcg into inhaler and inhale daily.   Yes Historical Provider, MD  zolpidem (AMBIEN) 5 MG tablet TAKE 1 TABLET AT BEDTIME AS NEEDED Patient taking differently: TAKE 1 TABLET AT BEDTIME AS NEEDED FOR SLEEP 02/01/15  Yes Marin Olp, MD  orphenadrine (NORFLEX) 100 MG tablet Take 1  tablet (100 mg total) by mouth 2 (two) times daily as needed for muscle spasms. 10/27/14   Marin Olp, MD   BP 130/73 mmHg  Pulse 109  Temp(Src) 97.4 F (36.3 C) (Oral)  Resp 24  Ht 6\' 1"  (1.854 m)  Wt 90.3 kg  BMI 26.27 kg/m2  SpO2 97% Physical Exam  Constitutional: He is oriented to person, place, and time. He appears well-developed and well-nourished. No distress.  Lethargic  HENT:  Head: Normocephalic and atraumatic.  Cardiovascular: Normal heart sounds and intact distal pulses.  Exam reveals no gallop and no friction rub.   No murmur heard. Tacky but regular  Pulmonary/Chest: Effort normal and breath sounds normal. No respiratory distress. He has no wheezes. He has no rales. He exhibits no tenderness.  Abdominal: He exhibits no mass. There is no rebound and no guarding.  Abdomen soft, non-distended No grimacing, rebound, or guarding with palpation - pt unable to specify abdominal tenderness Bowel sounds positive in all four quadrants  Musculoskeletal:       Left shoulder: He exhibits swelling, deformity and pain.       Arms: Decreased ROM of left shoulder. Decreased ROM of left elbow - likely 2/2 pain. No bony tenderness to left elbow.  5/5 grip strength bilaterally.  TTP as depicted in image. No open areas to skin of shoulder  Neurological: He is alert and oriented to person, place, and time.  Unable to give a coherent history. Intermittently able to follow  commands. Difficulty keeping eyes open during exam. All four extremities neurovascularly intact.   Skin: Skin is warm and dry. No rash noted.  Skin abrasion/tag of left hand.   Psychiatric: He has a normal mood and affect. His behavior is normal. Judgment and thought content normal.  Nursing note and vitals reviewed.   ED Course  Procedures (including critical care time) Labs Review Labs Reviewed  COMPREHENSIVE METABOLIC PANEL - Abnormal; Notable for the following:    Glucose, Bld 161 (*)    Calcium 8.7 (*)    Total Protein 5.5 (*)    Albumin 3.2 (*)    ALT 15 (*)    GFR calc non Af Amer 55 (*)    All other components within normal limits  CBC WITH DIFFERENTIAL/PLATELET - Abnormal; Notable for the following:    WBC 17.7 (*)    Platelets 111 (*)    Neutro Abs 15.3 (*)    Monocytes Absolute 1.1 (*)    All other components within normal limits  PROTIME-INR - Abnormal; Notable for the following:    Prothrombin Time 15.4 (*)    All other components within normal limits  MRSA PCR SCREENING  APTT  CBC  MAGNESIUM  PHOSPHORUS  COMPREHENSIVE METABOLIC PANEL    Imaging Review Dg Chest 1 View  04/23/2015  CLINICAL DATA:  Left rib pain after falling down steps today. Left shoulder and upper arm pain and deformity. EXAM: CHEST 1 VIEW COMPARISON:  02/03/2015 FINDINGS: Enlarged cardiac silhouette with a mild increase in size. Diffusely enlarged and tortuous thoracic aorta without significant change. Clear lungs. Interval mildly comminuted left humeral head and neck fracture with anterior dislocation of the humeral head relative to the glenoid. Mild right shoulder degenerative changes. No visible rib fracture or pneumothorax. IMPRESSION: 1. Comminuted left humeral head and neck fracture with dislocation of the humeral head. 2. Progressive cardiomegaly. Electronically Signed   By: Claudie Revering M.D.   On: 04/17/2015 22:00   Dg Shoulder 1v Left  04/22/2015  CLINICAL DATA:  [place] down 7 steps  today. EXAM: LEFT SHOULDER - 1 VIEW COMPARISON:  None. FINDINGS: There are significant study limitations due to positioning challenges. There appears to be a fracture dislocation of the left shoulder. The humeral head is inferior to the glenoid and there appears to be foreshortening about a comminuted proximal humeral fracture. IMPRESSION: Fracture dislocation about the left shoulder. Electronically Signed   By: Andreas Newport M.D.   On: 04/17/2015 22:07   Dg Elbow 2 Views Left  04/05/2015  CLINICAL DATA:  Left shoulder and proximal humerus pain and deformity following a fall down steps today. EXAM: LEFT ELBOW - 2 VIEW COMPARISON:  Left humerus radiographs obtained today. FINDINGS: The patient was unable to cooperate for an adequate lateral view due to the previously described proximal fracture/dislocation of the humerus. The elbow has a grossly normal appearance with the exception of mild coronoid process spur formation. No visible fracture, dislocation or effusion. IMPRESSION: Somewhat limited examination with no gross fracture or effusion. Electronically Signed   By: Claudie Revering M.D.   On: 04/11/2015 22:03   Ct Head Wo Contrast  03/27/2015  CLINICAL DATA:  Intracranial hemorrhage.  Increasing lethargy. EXAM: CT HEAD WITHOUT CONTRAST TECHNIQUE: Contiguous axial images were obtained from the base of the skull through the vertex without intravenous contrast. COMPARISON:  03/25/2015 at 21:50 FINDINGS: The intraparenchymal left frontoparietal convexity hemorrhage has enlarged. It currently measures 3.8 x 6.4 by 5 cm and previously measured 2.8 x 4.4 x 3 cm. There is increasing mass effect on the left lateral ventricular system and increasing sulcal effacement there now is 6 mm left-to-right midline shift. There also is increasing volume of subarachnoid hemorrhage. No intraventricular extension. Basal cisterns remain patent. IMPRESSION: Significant enlargement of the intraparenchymal hemorrhage. Increasing  subarachnoid hemorrhage. Worsening mass effect and midline shift. Critical Value/emergent results were called by telephone at the time of interpretation on 03/27/2015 at 12:35 am to Dr. Venora Maples, who verbally acknowledged these results. Electronically Signed   By: Andreas Newport M.D.   On: 03/27/2015 00:35   Ct Head Wo Contrast  04/01/2015  CLINICAL DATA:  81 year old male with fall and trauma to the back of the head EXAM: CT HEAD WITHOUT CONTRAST CT CERVICAL SPINE WITHOUT CONTRAST TECHNIQUE: Multidetector CT imaging of the head and cervical spine was performed following the standard protocol without intravenous contrast. Multiplanar CT image reconstructions of the cervical spine were also generated. COMPARISON:  The the head CT dated 02/09/2002 and MRI dated 05/26/2009 FINDINGS: CT HEAD FINDINGS There is a 2.8 x 4.4 cm intraparenchymal hemorrhage centered in the left frontoparietal convexity and centrum semiovale. There is mild white matter edema in the surrounding brain parenchyma with mild mass effect and effacement of the sulci. Small bilateral parietal lobe subarachnoid hemorrhages noted. Small scattered bilateral cortical hyperdensity involving the right occipital lobe as well as bilateral frontal lobes may represent cortical contusions versus small subarachnoid hemorrhages. No intraventricular hemorrhage identified. There is no midline shift. There is slight prominence of the ventricles and sulci compatible with age-related volume loss. Mild periventricular and deep white matter hypodensities represent chronic microvascular ischemic changes. The visualized paranasal sinuses and mastoid air cells are well aerated. The calvarium is intact. CT CERVICAL SPINE FINDINGS There is no acute fracture or subluxation of the cervical spine.There is osteopenia with multilevel degenerative changes. There is mild loss of C6 vertebral body height, likely chronic.The odontoid and spinous processes are intact.There is normal  anatomic alignment of the  C1-C2 lateral masses. The visualized soft tissues appear unremarkable. IMPRESSION: Intraparenchymal hemorrhage involving the left frontoparietal convexity with surrounding edema and mild mass effect. No midline shift. Small bilateral parietal subarachnoid hemorrhages. Small scattered cortical contusions predominantly involving the right parietal and bilateral frontal lobes. No intraventricular hemorrhage. No acute/traumatic cervical spine pathology. Critical Value/emergent results were called by telephone at the time of interpretation on 03/29/2015 at 10:24 pm to Dr. Deno Etienne , who verbally acknowledged these results. Electronically Signed   By: Anner Crete M.D.   On: 04/10/2015 22:26   Ct Chest W Contrast  04/15/2015  CLINICAL DATA:  Status post fall with left-sided pain. EXAM: CT CHEST WITH CONTRAST CT ABDOMEN AND PELVIS WITH CONTRAST TECHNIQUE: Multidetector CT imaging of the chest was performed during intravenous contrast administration. Multidetector CT imaging of the abdomen and pelvis was performed following the standard protocol after bolus administration of intravenous contrast. CONTRAST:  88mL OMNIPAQUE IOHEXOL 300 MG/ML  SOLN COMPARISON:  Chest radiograph 03/24/2015 FINDINGS: CT CHEST FINDINGS Mediastinum/Lymph Nodes: No masses, pathologically enlarged lymph nodes. The heart is enlarged. There is fusiform dilation of the ascending aorta measuring 4.6 cm. Heavy atherosclerotic disease and tortuosity of the aorta is noted. There is a saccular aneurysm of the mid descending thoracic aorta measuring 4.3 cm in maximum transverse diameter. Lungs/Pleura: No pulmonary mass, infiltrate, or effusion. Emphysematous changes of the lungs are noted. Musculoskeletal: No chest wall mass or suspicious bone lesions identified. There is a severely comminuted fracture of the left humeral head and neck with inferior/anterior dislocation of the humeral head in relation to the glenoid. CT  ABDOMEN PELVIS FINDINGS Hepatobiliary: No masses or other significant abnormality. Pancreas: No mass, inflammatory changes, or other significant abnormality. Spleen: Within normal limits in size and appearance. Adrenals/Urinary Tract: No masses identified. No evidence of hydronephrosis. 2.2 cm right inferior pole renal cyst is seen. Bilateral, left worse than right renal cortical thinning is seen. Stomach/Bowel: No evidence of obstruction, inflammatory process, or abnormal fluid collections. Vascular/Lymphatic: No pathologically enlarged lymph nodes. There is an abdominal aortic aneurysm at the level of the takeoff of the renal arteries which measures 4.6 cm in maximum transverse diameter. The aneurysmal sac measures approximately 5.5 cm in anterior to posterior dimension. There is a eccentric thrombus within the aneurysmal sac along the right aortic wall. Celiac trunk, superior mesenteric artery, bilateral renal arteries are opacified. Bilateral renal arteries arise from the aneurysmal portion of the aorta. Soft tissue thickening is noted along the aortic wall inferior to the aneurysmal dilation, which may represent a noncalcified plaque or intramural thrombus. 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 A small area of dissection is also seen within the aneurysmal portion of the superior mesenteric artery, best seen on image 52, coronal images. Reproductive: The urinary bladder is distended. The prostate gland is enlarged and lobular measuring up to 5.4 cm, impressing on the posterior wall of the urinary bladder. Other: None. Musculoskeletal:  No suspicious bone lesions identified. IMPRESSION: Fusiform dilation of the ascending aorta measuring 4.6 cm in maximum diameter. 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 Abdominal aortic aneurysm measuring 4.6 cm in maximum transverse diameter  at the level of the renal arteries. Large intramural thrombus is seen within the aneurysmal sac. Thickening of the abdominal aortic wall inferior to the aneurysmal dilation, which may represent soft atherosclerotic plaque. Posttraumatic changes are not excluded although felt less  likely in the absence of other signs of intra-abdominal injury. 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 Background of heavy atherosclerotic disease. Enlarged globular prostate gland, impressing on the posterior wall of the urinary bladder, which is distended. Bilateral renal cortical thinning. Comminuted fracture dislocation of the left humerus. These results were called by telephone at the time of interpretation on 04/05/2015 at 11:03 pm to Dr. Deno Etienne , who verbally acknowledged these results. Electronically Signed   By: Fidela Salisbury M.D.   On: 04/21/2015 23:05   Ct Cervical Spine Wo Contrast  04/22/2015  CLINICAL DATA:  81 year old male with fall and trauma to the back of the head EXAM: CT HEAD WITHOUT CONTRAST CT CERVICAL SPINE WITHOUT CONTRAST TECHNIQUE: Multidetector CT imaging of the head and cervical spine was performed following the standard protocol without intravenous contrast. Multiplanar CT image reconstructions of the cervical spine were also generated. COMPARISON:  The the head CT dated 02/09/2002 and MRI dated 05/26/2009 FINDINGS: CT HEAD FINDINGS There is a 2.8 x 4.4 cm intraparenchymal hemorrhage centered in the left frontoparietal convexity and centrum semiovale. There is mild white matter edema in the surrounding brain parenchyma with mild mass effect and effacement of the sulci. Small bilateral parietal lobe subarachnoid hemorrhages noted. Small scattered bilateral cortical hyperdensity involving the right occipital lobe as well as bilateral frontal lobes may represent cortical contusions versus small subarachnoid hemorrhages. No intraventricular  hemorrhage identified. There is no midline shift. There is slight prominence of the ventricles and sulci compatible with age-related volume loss. Mild periventricular and deep white matter hypodensities represent chronic microvascular ischemic changes. The visualized paranasal sinuses and mastoid air cells are well aerated. The calvarium is intact. CT CERVICAL SPINE FINDINGS There is no acute fracture or subluxation of the cervical spine.There is osteopenia with multilevel degenerative changes. There is mild loss of C6 vertebral body height, likely chronic.The odontoid and spinous processes are intact.There is normal anatomic alignment of the C1-C2 lateral masses. The visualized soft tissues appear unremarkable. IMPRESSION: Intraparenchymal hemorrhage involving the left frontoparietal convexity with surrounding edema and mild mass effect. No midline shift. Small bilateral parietal subarachnoid hemorrhages. Small scattered cortical contusions predominantly involving the right parietal and bilateral frontal lobes. No intraventricular hemorrhage. No acute/traumatic cervical spine pathology. Critical Value/emergent results were called by telephone at the time of interpretation on 03/31/2015 at 10:24 pm to Dr. Deno Etienne , who verbally acknowledged these results. Electronically Signed   By: Anner Crete M.D.   On: 03/25/2015 22:26   Ct Abdomen Pelvis W Contrast  04/11/2015  CLINICAL DATA:  Status post fall with left-sided pain. EXAM: CT CHEST WITH CONTRAST CT ABDOMEN AND PELVIS WITH CONTRAST TECHNIQUE: Multidetector CT imaging of the chest was performed during intravenous contrast administration. Multidetector CT imaging of the abdomen and pelvis was performed following the standard protocol after bolus administration of intravenous contrast. CONTRAST:  69mL OMNIPAQUE IOHEXOL 300 MG/ML  SOLN COMPARISON:  Chest radiograph 04/13/2015 FINDINGS: CT CHEST FINDINGS Mediastinum/Lymph Nodes: No masses, pathologically  enlarged lymph nodes. The heart is enlarged. There is fusiform dilation of the ascending aorta measuring 4.6 cm. Heavy atherosclerotic disease and tortuosity of the aorta is noted. There is a saccular aneurysm of the mid descending thoracic aorta measuring 4.3 cm in maximum transverse diameter. Lungs/Pleura: No pulmonary mass, infiltrate, or effusion. Emphysematous changes of the lungs are noted. Musculoskeletal: No chest wall mass or suspicious bone lesions identified. There is a severely comminuted fracture of the left  humeral head and neck with inferior/anterior dislocation of the humeral head in relation to the glenoid. CT ABDOMEN PELVIS FINDINGS Hepatobiliary: No masses or other significant abnormality. Pancreas: No mass, inflammatory changes, or other significant abnormality. Spleen: Within normal limits in size and appearance. Adrenals/Urinary Tract: No masses identified. No evidence of hydronephrosis. 2.2 cm right inferior pole renal cyst is seen. Bilateral, left worse than right renal cortical thinning is seen. Stomach/Bowel: No evidence of obstruction, inflammatory process, or abnormal fluid collections. Vascular/Lymphatic: No pathologically enlarged lymph nodes. There is an abdominal aortic aneurysm at the level of the takeoff of the renal arteries which measures 4.6 cm in maximum transverse diameter. The aneurysmal sac measures approximately 5.5 cm in anterior to posterior dimension. There is a eccentric thrombus within the aneurysmal sac along the right aortic wall. Celiac trunk, superior mesenteric artery, bilateral renal arteries are opacified. Bilateral renal arteries arise from the aneurysmal portion of the aorta. Soft tissue thickening is noted along the aortic wall inferior to the aneurysmal dilation, which may represent a noncalcified plaque or intramural thrombus. 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 A small area of  dissection is also seen within the aneurysmal portion of the superior mesenteric artery, best seen on image 52, coronal images. Reproductive: The urinary bladder is distended. The prostate gland is enlarged and lobular measuring up to 5.4 cm, impressing on the posterior wall of the urinary bladder. Other: None. Musculoskeletal:  No suspicious bone lesions identified. IMPRESSION: Fusiform dilation of the ascending aorta measuring 4.6 cm in maximum diameter. 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 Abdominal aortic aneurysm measuring 4.6 cm in maximum transverse diameter at the level of the renal arteries. Large intramural thrombus is seen within the aneurysmal sac. Thickening of the abdominal aortic wall inferior to the aneurysmal dilation, which may represent soft atherosclerotic plaque. Posttraumatic changes are not excluded although felt less likely in the absence of other signs of intra-abdominal injury. 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 Background of heavy atherosclerotic disease. Enlarged globular prostate gland, impressing on the posterior wall of the urinary bladder, which is distended. Bilateral renal cortical thinning. Comminuted fracture dislocation of the left humerus. These results were called by telephone at the time of interpretation on 04/17/2015 at 11:03 pm to Dr. Deno Etienne , who verbally acknowledged these results. Electronically Signed   By: Fidela Salisbury M.D.   On: 04/20/2015 23:05   Dg Humerus Left  04/19/2015  CLINICAL DATA:  Left shoulder and upper arm pain and deformity after falling down steps today. EXAM: LEFT HUMERUS - 2+ VIEW COMPARISON:  Portable chest obtained today. Left shoulder radiograph obtained today. FINDINGS: Again demonstrated is a comminuted fracture of the left humeral head and neck with inferior dislocation of the humeral head relative to the  glenoid. Associated soft tissue swelling. No other fractures or dislocations. IMPRESSION: Comminuted fracture of the left humeral head and neck with inferior dislocation of the humeral head relative to the glenoid. Electronically Signed   By: Claudie Revering M.D.   On: 04/16/2015 22:01   I have personally reviewed and evaluated these images and lab results as part of my medical decision-making.   EKG Interpretation None      MDM   Final diagnoses:  Fall  Deformity  ICH (intracerebral hemorrhage) ([place])   Malikah R Chancy presents with multiple areas of  trauma after fall.  9:23 PM - spoke with radiology who states rib series cannot be done at this time 2/2 condition of shoulder. Will still get one view chest.  10:08 PM - Consulted neurosurg - Dr. Cyndy Freeze who reviewed head images: states pt. Is not candidate for surgery and recommends ICU admission under trauma or critical care.   11:11 PM - Consulted critical care who will come see patient; patient re-evaluated following commands, but no verbal response. Family at bedside informed to let me or nurse aware of any changes in patient's condition. Will closely observe for changes in mental status.   12:14 AM - Pt. Re-evaluated; not following commands as quickly, responding to pain intermittently. Repeat CT head. Neurosurg informed. Trauma consulted and informed. Dr. Tyrone Nine intubated pt. Dr. Wyvonnia Dusky assuming care at shift change.   12:55 AM - Ortho consulted. Recommend sling and will see patient in the morning. Patient to be admitted to critical care. GCS of 6 before transport.   Patient seen by and discussed with Dr. Tyrone Nine who agrees with treatment plan.      [facility] Raydel Hosick, PA-C 03/27/15 [place], DO 03/27/15 [id number]

## 2015-03-27 ENCOUNTER — Inpatient Hospital Stay (HOSPITAL_COMMUNITY): Payer: Commercial Managed Care - HMO

## 2015-03-27 DIAGNOSIS — J9601 Acute respiratory failure with hypoxia: Secondary | ICD-10-CM

## 2015-03-27 DIAGNOSIS — Y92007 Garden or yard of unspecified non-institutional (private) residence as the place of occurrence of the external cause: Secondary | ICD-10-CM | POA: Diagnosis not present

## 2015-03-27 DIAGNOSIS — G4730 Sleep apnea, unspecified: Secondary | ICD-10-CM | POA: Diagnosis present

## 2015-03-27 DIAGNOSIS — D696 Thrombocytopenia, unspecified: Secondary | ICD-10-CM | POA: Diagnosis present

## 2015-03-27 DIAGNOSIS — Z9841 Cataract extraction status, right eye: Secondary | ICD-10-CM | POA: Diagnosis not present

## 2015-03-27 DIAGNOSIS — R402313 Coma scale, best motor response, none, at hospital admission: Secondary | ICD-10-CM | POA: Diagnosis present

## 2015-03-27 DIAGNOSIS — I252 Old myocardial infarction: Secondary | ICD-10-CM | POA: Diagnosis not present

## 2015-03-27 DIAGNOSIS — G936 Cerebral edema: Secondary | ICD-10-CM | POA: Diagnosis present

## 2015-03-27 DIAGNOSIS — S066X0A Traumatic subarachnoid hemorrhage without loss of consciousness, initial encounter: Secondary | ICD-10-CM | POA: Diagnosis present

## 2015-03-27 DIAGNOSIS — Z9861 Coronary angioplasty status: Secondary | ICD-10-CM | POA: Diagnosis not present

## 2015-03-27 DIAGNOSIS — Z66 Do not resuscitate: Secondary | ICD-10-CM | POA: Diagnosis present

## 2015-03-27 DIAGNOSIS — I2510 Atherosclerotic heart disease of native coronary artery without angina pectoris: Secondary | ICD-10-CM | POA: Diagnosis present

## 2015-03-27 DIAGNOSIS — K219 Gastro-esophageal reflux disease without esophagitis: Secondary | ICD-10-CM | POA: Diagnosis present

## 2015-03-27 DIAGNOSIS — R402213 Coma scale, best verbal response, none, at hospital admission: Secondary | ICD-10-CM | POA: Diagnosis present

## 2015-03-27 DIAGNOSIS — R319 Hematuria, unspecified: Secondary | ICD-10-CM | POA: Diagnosis present

## 2015-03-27 DIAGNOSIS — Z888 Allergy status to other drugs, medicaments and biological substances status: Secondary | ICD-10-CM | POA: Diagnosis not present

## 2015-03-27 DIAGNOSIS — E440 Moderate protein-calorie malnutrition: Secondary | ICD-10-CM | POA: Diagnosis present

## 2015-03-27 DIAGNOSIS — J8410 Pulmonary fibrosis, unspecified: Secondary | ICD-10-CM | POA: Diagnosis present

## 2015-03-27 DIAGNOSIS — Z6825 Body mass index (BMI) 25.0-25.9, adult: Secondary | ICD-10-CM | POA: Diagnosis not present

## 2015-03-27 DIAGNOSIS — E785 Hyperlipidemia, unspecified: Secondary | ICD-10-CM | POA: Diagnosis present

## 2015-03-27 DIAGNOSIS — G8191 Hemiplegia, unspecified affecting right dominant side: Secondary | ICD-10-CM | POA: Diagnosis present

## 2015-03-27 DIAGNOSIS — S0003XA Contusion of scalp, initial encounter: Secondary | ICD-10-CM | POA: Diagnosis present

## 2015-03-27 DIAGNOSIS — Z7982 Long term (current) use of aspirin: Secondary | ICD-10-CM | POA: Diagnosis not present

## 2015-03-27 DIAGNOSIS — N400 Enlarged prostate without lower urinary tract symptoms: Secondary | ICD-10-CM | POA: Diagnosis present

## 2015-03-27 DIAGNOSIS — I130 Hypertensive heart and chronic kidney disease with heart failure and stage 1 through stage 4 chronic kidney disease, or unspecified chronic kidney disease: Secondary | ICD-10-CM | POA: Diagnosis present

## 2015-03-27 DIAGNOSIS — Z87891 Personal history of nicotine dependence: Secondary | ICD-10-CM | POA: Diagnosis not present

## 2015-03-27 DIAGNOSIS — Z905 Acquired absence of kidney: Secondary | ICD-10-CM | POA: Diagnosis not present

## 2015-03-27 DIAGNOSIS — S06359A Traumatic hemorrhage of left cerebrum with loss of consciousness of unspecified duration, initial encounter: Secondary | ICD-10-CM | POA: Diagnosis not present

## 2015-03-27 DIAGNOSIS — G4700 Insomnia, unspecified: Secondary | ICD-10-CM | POA: Diagnosis present

## 2015-03-27 DIAGNOSIS — I619 Nontraumatic intracerebral hemorrhage, unspecified: Secondary | ICD-10-CM | POA: Diagnosis present

## 2015-03-27 DIAGNOSIS — R402113 Coma scale, eyes open, never, at hospital admission: Secondary | ICD-10-CM | POA: Diagnosis present

## 2015-03-27 DIAGNOSIS — Z952 Presence of prosthetic heart valve: Secondary | ICD-10-CM | POA: Diagnosis not present

## 2015-03-27 DIAGNOSIS — J449 Chronic obstructive pulmonary disease, unspecified: Secondary | ICD-10-CM | POA: Diagnosis present

## 2015-03-27 DIAGNOSIS — S43005A Unspecified dislocation of left shoulder joint, initial encounter: Secondary | ICD-10-CM | POA: Diagnosis present

## 2015-03-27 DIAGNOSIS — M62838 Other muscle spasm: Secondary | ICD-10-CM | POA: Diagnosis present

## 2015-03-27 DIAGNOSIS — S42292A Other displaced fracture of upper end of left humerus, initial encounter for closed fracture: Secondary | ICD-10-CM | POA: Diagnosis present

## 2015-03-27 DIAGNOSIS — Z79899 Other long term (current) drug therapy: Secondary | ICD-10-CM | POA: Diagnosis not present

## 2015-03-27 DIAGNOSIS — Z8249 Family history of ischemic heart disease and other diseases of the circulatory system: Secondary | ICD-10-CM | POA: Diagnosis not present

## 2015-03-27 DIAGNOSIS — J45909 Unspecified asthma, uncomplicated: Secondary | ICD-10-CM | POA: Diagnosis present

## 2015-03-27 DIAGNOSIS — I5022 Chronic systolic (congestive) heart failure: Secondary | ICD-10-CM | POA: Diagnosis present

## 2015-03-27 DIAGNOSIS — Z8679 Personal history of other diseases of the circulatory system: Secondary | ICD-10-CM | POA: Diagnosis not present

## 2015-03-27 DIAGNOSIS — N183 Chronic kidney disease, stage 3 (moderate): Secondary | ICD-10-CM | POA: Diagnosis present

## 2015-03-27 DIAGNOSIS — Z9842 Cataract extraction status, left eye: Secondary | ICD-10-CM | POA: Diagnosis not present

## 2015-03-27 DIAGNOSIS — W1839XA Other fall on same level, initial encounter: Secondary | ICD-10-CM | POA: Diagnosis present

## 2015-03-27 DIAGNOSIS — J9690 Respiratory failure, unspecified, unspecified whether with hypoxia or hypercapnia: Secondary | ICD-10-CM | POA: Diagnosis present

## 2015-03-27 DIAGNOSIS — R000 Tachycardia, unspecified: Secondary | ICD-10-CM | POA: Diagnosis present

## 2015-03-27 DIAGNOSIS — R911 Solitary pulmonary nodule: Secondary | ICD-10-CM | POA: Diagnosis present

## 2015-03-27 LAB — CBC
HCT: 42.4 % (ref 39.0–52.0)
HEMOGLOBIN: 13.8 g/dL (ref 13.0–17.0)
MCH: 29.7 pg (ref 26.0–34.0)
MCHC: 32.5 g/dL (ref 30.0–36.0)
MCV: 91.2 fL (ref 78.0–100.0)
Platelets: 122 10*3/uL — ABNORMAL LOW (ref 150–400)
RBC: 4.65 MIL/uL (ref 4.22–5.81)
RDW: 13.8 % (ref 11.5–15.5)
WBC: 16.3 10*3/uL — ABNORMAL HIGH (ref 4.0–10.5)

## 2015-03-27 LAB — PHOSPHORUS: Phosphorus: 3.1 mg/dL (ref 2.5–4.6)

## 2015-03-27 LAB — POCT I-STAT 3, ART BLOOD GAS (G3+)
ACID-BASE DEFICIT: 2 mmol/L (ref 0.0–2.0)
Bicarbonate: 23.5 mEq/L (ref 20.0–24.0)
O2 Saturation: 99 %
PCO2 ART: 40.7 mmHg (ref 35.0–45.0)
PH ART: 7.369 (ref 7.350–7.450)
PO2 ART: 171 mmHg — AB (ref 80.0–100.0)
Patient temperature: 36.8
TCO2: 25 mmol/L (ref 0–100)

## 2015-03-27 LAB — COMPREHENSIVE METABOLIC PANEL
ALBUMIN: 3.2 g/dL — AB (ref 3.5–5.0)
ALK PHOS: 57 U/L (ref 38–126)
ALT: 20 U/L (ref 17–63)
ANION GAP: 6 (ref 5–15)
AST: 26 U/L (ref 15–41)
BUN: 20 mg/dL (ref 6–20)
CHLORIDE: 109 mmol/L (ref 101–111)
CO2: 26 mmol/L (ref 22–32)
Calcium: 8.8 mg/dL — ABNORMAL LOW (ref 8.9–10.3)
Creatinine, Ser: 1.48 mg/dL — ABNORMAL HIGH (ref 0.61–1.24)
GFR calc non Af Amer: 42 mL/min — ABNORMAL LOW (ref >60)
GFR, EST AFRICAN AMERICAN: 49 mL/min — AB (ref >60)
GLUCOSE: 213 mg/dL — AB (ref 65–99)
POTASSIUM: 4.9 mmol/L (ref 3.5–5.1)
SODIUM: 141 mmol/L (ref 135–145)
Total Bilirubin: 1.3 mg/dL — ABNORMAL HIGH (ref 0.3–1.2)
Total Protein: 5.5 g/dL — ABNORMAL LOW (ref 6.5–8.1)

## 2015-03-27 LAB — SODIUM
SODIUM: 143 mmol/L (ref 135–145)
SODIUM: 145 mmol/L (ref 135–145)
Sodium: 150 mmol/L — ABNORMAL HIGH (ref 135–145)

## 2015-03-27 LAB — MAGNESIUM: MAGNESIUM: 2.2 mg/dL (ref 1.7–2.4)

## 2015-03-27 LAB — MRSA PCR SCREENING: MRSA by PCR: NEGATIVE

## 2015-03-27 LAB — OSMOLALITY: OSMOLALITY: 307 mosm/kg — AB (ref 275–295)

## 2015-03-27 MED ORDER — FENTANYL CITRATE (PF) 100 MCG/2ML IJ SOLN
100.0000 ug | Freq: Once | INTRAMUSCULAR | Status: AC
  Administered 2015-03-27: 100 ug via INTRAVENOUS
  Filled 2015-03-27: qty 2

## 2015-03-27 MED ORDER — LABETALOL HCL 5 MG/ML IV SOLN: 10.0000 mg | INTRAVENOUS | Status: DC | PRN

## 2015-03-27 MED ORDER — PANTOPRAZOLE SODIUM 40 MG IV SOLR
40.0000 mg | INTRAVENOUS | Status: DC
  Administered 2015-03-27 – 2015-03-28 (×2): 40 mg via INTRAVENOUS
  Filled 2015-03-27 (×2): qty 40

## 2015-03-27 MED ORDER — IPRATROPIUM-ALBUTEROL 0.5-2.5 (3) MG/3ML IN SOLN
3.0000 mL | Freq: Four times a day (QID) | RESPIRATORY_TRACT | Status: DC
  Administered 2015-03-27 (×2): 3 mL via RESPIRATORY_TRACT
  Filled 2015-03-27 (×2): qty 3

## 2015-03-27 MED ORDER — DEXTROSE-NACL 5-0.9 % IV SOLN: INTRAVENOUS | Status: DC

## 2015-03-27 MED ORDER — FENTANYL CITRATE (PF) 100 MCG/2ML IJ SOLN: 50.0000 ug | INTRAMUSCULAR | Status: DC | PRN

## 2015-03-27 MED ORDER — FENTANYL CITRATE (PF) 100 MCG/2ML IJ SOLN
50.0000 ug | INTRAMUSCULAR | Status: DC | PRN
  Administered 2015-03-28 – 2015-03-29 (×4): 50 ug via INTRAVENOUS
  Filled 2015-03-27 (×4): qty 2

## 2015-03-27 MED ORDER — SODIUM CHLORIDE 3 % IV SOLN: INTRAVENOUS | Status: DC

## 2015-03-27 MED ORDER — ETOMIDATE 2 MG/ML IV SOLN
INTRAVENOUS | Status: DC | PRN
  Administered 2015-03-27: 20 mg via INTRAVENOUS

## 2015-03-27 MED ORDER — NIMODIPINE 60 MG/20ML PO SOLN: 60.0000 mg | ORAL | Status: DC

## 2015-03-27 MED ORDER — DEXTROSE-NACL 5-0.45 % IV SOLN: INTRAVENOUS | Status: DC

## 2015-03-27 MED ORDER — LABETALOL HCL 5 MG/ML IV SOLN
10.0000 mg | Freq: Once | INTRAVENOUS | Status: AC
  Administered 2015-03-27: 10 mg via INTRAVENOUS

## 2015-03-27 MED ORDER — CHLORHEXIDINE GLUCONATE 0.12% ORAL RINSE (MEDLINE KIT)
15.0000 mL | Freq: Two times a day (BID) | OROMUCOSAL | Status: DC
  Administered 2015-03-27 – 2015-03-29 (×5): 15 mL via OROMUCOSAL

## 2015-03-27 MED ORDER — LABETALOL HCL 5 MG/ML IV SOLN
INTRAVENOUS | Status: AC
  Administered 2015-03-27: 10 mg
  Filled 2015-03-27: qty 4

## 2015-03-27 MED ORDER — LEVETIRACETAM 500 MG/5ML IV SOLN
1000.0000 mg | Freq: Once | INTRAVENOUS | Status: AC
  Administered 2015-03-27: 1000 mg via INTRAVENOUS
  Filled 2015-03-27: qty 10

## 2015-03-27 MED ORDER — LEVETIRACETAM 500 MG/5ML IV SOLN
500.0000 mg | Freq: Two times a day (BID) | INTRAVENOUS | Status: DC
  Administered 2015-03-27 – 2015-03-29 (×5): 500 mg via INTRAVENOUS
  Filled 2015-03-27 (×7): qty 5

## 2015-03-27 MED ORDER — ONDANSETRON HCL 4 MG PO TABS: 4.0000 mg | ORAL_TABLET | Freq: Four times a day (QID) | ORAL | Status: DC | PRN

## 2015-03-27 MED ORDER — SODIUM CHLORIDE 3 % IV SOLN
INTRAVENOUS | Status: DC
  Administered 2015-03-27 (×2): 50 mL/h via INTRAVENOUS
  Administered 2015-03-27 – 2015-03-28 (×3): 75 mL/h via INTRAVENOUS
  Filled 2015-03-27 (×15): qty 500

## 2015-03-27 MED ORDER — PROPOFOL 1000 MG/100ML IV EMUL: 5.0000 ug/kg/min | INTRAVENOUS | Status: DC

## 2015-03-27 MED ORDER — SUCCINYLCHOLINE CHLORIDE 20 MG/ML IJ SOLN
INTRAMUSCULAR | Status: DC | PRN
Start: 2015-03-27 — End: 2015-03-27
  Administered 2015-03-27: 150 mg via INTRAVENOUS

## 2015-03-27 MED ORDER — ANTISEPTIC ORAL RINSE SOLUTION (CORINZ)
7.0000 mL | OROMUCOSAL | Status: DC
  Administered 2015-03-27 – 2015-03-29 (×26): 7 mL via OROMUCOSAL

## 2015-03-27 MED ORDER — HYDROMORPHONE HCL 1 MG/ML IJ SOLN
1.0000 mg | INTRAMUSCULAR | Status: DC | PRN
  Administered 2015-03-28: 1 mg via INTRAVENOUS
  Filled 2015-03-27: qty 1

## 2015-03-27 MED ORDER — SODIUM CHLORIDE 3 % IV SOLN
INTRAVENOUS | Status: DC
  Filled 2015-03-27 (×2): qty 500

## 2015-03-27 MED ORDER — ONDANSETRON HCL 4 MG/2ML IJ SOLN: 4.0000 mg | Freq: Four times a day (QID) | INTRAMUSCULAR | Status: DC | PRN

## 2015-03-27 MED ORDER — FAMOTIDINE IN NACL 20-0.9 MG/50ML-% IV SOLN: 20.0000 mg | Freq: Two times a day (BID) | INTRAVENOUS | Status: DC

## 2015-03-27 MED ORDER — PHENYLEPHRINE HCL 10 MG/ML IJ SOLN
30.0000 ug/min | INTRAMUSCULAR | Status: DC
  Filled 2015-03-27 (×2): qty 1

## 2015-03-27 MED ORDER — ACETAMINOPHEN 160 MG/5ML PO SOLN
650.0000 mg | ORAL | Status: DC | PRN
  Administered 2015-03-27 – 2015-03-29 (×3): 650 mg
  Filled 2015-03-27 (×3): qty 20.3

## 2015-03-27 NOTE — Progress Notes (Signed)
Utilization Review Completed.Nicholas Trompeter T12/07/2014  

## 2015-03-27 NOTE — H&P (Signed)
SHOWN DISSINGER is an 81 y.o. male.   Chief Complaint: Fall from level round HPI: Patient presents to emergency room after falling from level round. He had gotten out of his car after a long trip is walking toward his house when he lost his balance and fell backwards striking his left side and left head. He was brought to the emergency room as a non-activation. Workup included a head CT which showed a large intracerebral hemorrhage and a fracture dislocation of his left proximal humerus. His mental status declined and he was intubated in the emergency room. Upon examination he was obtunded with a Glasgow Coma Scale of 3 prior to intubation. EDP had call neurosurgery and orthopedics. I discussed history with the family who provided this information.  Past Medical History  Diagnosis Date  . Kidney tumor (benign)   . Chronic headache   . Hypertension   . Nephrolithiasis   . Renal insufficiency   . Peripheral vascular disease ([place])   . BPH (benign prostatic hypertrophy)   . CAD (coronary artery disease)   . Detached retina   . Asthma   . Allergic rhinitis   . Sleep apnea   . Pulmonary fibrosis [facility]) Jan 2009    very subtle possible fibrosis noted on CT Jan 2009.  Unchanged since Oct 2006. Deemed possibly due to chlorine exposure at gym work swimming pool  . COPD (chronic obstructive pulmonary disease) ([place])     2001 PFTs  - FEv1 2.5L/66%, DLCO 57% -> March 2009: Fev1 1.91L/625, Ratio 50, DLCO 50%  . AAA (abdominal aortic aneurysm) (H[facility])     Rupture 16 years ago  . History of colonic diverticulitis   . Aneurysm, common iliac artery ([place])   . Myocardial infarction ([place])     minor in 2000, 2001  . Aortic insufficiency   . Chronic systolic CHF (congestive heart failure) ([place])   . Cholelithiasis 09/14/2012  . Pulmonary nodule, left 10/27/2010    #Pulmonary nodule Left Lower Lobe  - CT 2009 present  - CXR 2012 - not present  - Patient prefers clinical followup after weighing options    .  NEPHROLITHIASIS, HX OF 10/08/2006    Qualifier: Diagnosis of  By: Leanne Chang MD, Bruce    . PULMONARY HYPERTENSION 06/02/2007    Qualifier: Diagnosis of  By: Chase Caller MD, Murali  Echo 07/31/10: no mention of pulmonary htn  Tricuspid valve: Structurally normal valve. Leaflet separation was normal. Doppler: Transvalvular velocity was within the normal range. Mild regurgitation.     . TESTOSTERONE DEFICIENCY 02/11/2007    Qualifier: Diagnosis of  By: Leanne Chang MD, Bruce    . HLD (hyperlipidemia) 01/22/2013    Past Surgical History  Procedure Laterality Date  . Nephrectomy  2003     left partial, benign tumor  . Vasectomy    . Ptca  2001 and 2002  . Abdominal aortic aneurysm repair  1997  . Cataract extraction      bilateral  . Cardiac catheterization  07/22/02    EF 55%  . Abdominal aortic aneurysm repair    . Eye surgery    . Esophagogastroduodenoscopy N/A 09/15/2012    Procedure: ESOPHAGOGASTRODUODENOSCOPY (EGD);  Surgeon: Lafayette Dragon, MD;  Location: [facility] ENDOSCOPY;  Service: Endoscopy;  Laterality: N/A;  . Esophagogastroduodenoscopy N/A 09/09/2013    Procedure: ESOPHAGOGASTRODUODENOSCOPY (EGD);  Surgeon: Inda Castle, MD;  Location: [facility] ENDOSCOPY;  Service: Endoscopy;  Laterality: N/A;  talk to robin ,had no opening for mac at this time could  not put a mac case in dr. Paulita Fujita day .Shirlean Mylar said to put it as moderate sed.  jacqueline aiken,if something come available will contac robin  . Savory dilation N/A 09/09/2013    Procedure: SAVORY DILATION;  Surgeon: Inda Castle, MD;  Location: [facility] ENDOSCOPY;  Service: Endoscopy;  Laterality: N/A;  . Cardiac catheterization N/A 02/04/2015    Procedure: Left Heart Cath and Coronary Angiography;  Surgeon: Peter M Martinique, MD;  Location: [facility];  Service: Cardiovascular;  Laterality: N/A;    Family History  Problem Relation Age of Onset  . Aneurysm Father 60    deceased  . Other Father     AAA  . Hypertension Mother   . Other Mother      amputation (leg)   Social History:  reports that he quit smoking about 17 years ago. His smoking use included Cigarettes. He has a 50 pack-year smoking history. He has never used smokeless tobacco. He reports that he does not drink alcohol or use illicit drugs.  Allergies:  Allergies  Allergen Reactions  . Beta Adrenergic Blockers Shortness Of Breath and Other (See Comments)    Difficulty breathing  . Statins Other (See Comments)    Crestor, lipitor-UNKNOWN REACTION    Medications Prior to Admission  Medication Sig Dispense Refill  . acetaminophen (TYLENOL) 325 MG tablet Take 650 mg by mouth every 6 (six) hours as needed (pain).     Marland Kitchen albuterol (PROVENTIL HFA;VENTOLIN HFA) 108 (90 BASE) MCG/ACT inhaler Inhale 2 puffs into the lungs every 6 (six) hours as needed for wheezing or shortness of breath. 1 Inhaler 0  . aspirin EC 81 MG tablet Take 81 mg by mouth at bedtime.     . beta carotene w/minerals (OCUVITE) tablet Take 1 tablet by mouth daily.    . finasteride (PROSCAR) 5 MG tablet Take 1 tablet (5 mg total) by mouth daily. (Patient taking differently: Take 5 mg by mouth every evening. ) 90 tablet 2  . Garlic 2 MG CAPS Take 1 capsule by mouth daily.    Marland Kitchen ibuprofen (ADVIL,MOTRIN) 800 MG tablet TAKE 1 TABLET BY MOUTH EVERY 8 HOURS AS NEEDED FOR MIGRAINE HEADACHES 30 tablet 0  . lisinopril (PRINIVIL,ZESTRIL) 40 MG tablet TAKE 1 TABLET EVERY DAY  ( APPOINTMENT IS NEEDED  FOR  FURTHER  REFILLS ) 90 tablet 4  . LORazepam (ATIVAN) 1 MG tablet TAKE 1 TABLET EVERY DAY AS NEEDED FOR ANXIETY 10 tablet 0  . Magnesium 100 MG CAPS Take 1 capsule by mouth daily.    . Multiple Vitamin (MULTIVITAMIN) capsule Take 1 capsule by mouth daily.    . nitroGLYCERIN (NITROSTAT) 0.4 MG SL tablet Place 1 tablet (0.4 mg total) under the tongue every 5 (five) minutes as needed for chest pain. 25 tablet 6  . omeprazole (PRILOSEC) 20 MG capsule TAKE 1 CAPSULE TWICE A DAY 180 capsule 3  . tiotropium (SPIRIVA) 18 MCG  inhalation capsule Place 18 mcg into inhaler and inhale daily.    Marland Kitchen zolpidem (AMBIEN) 5 MG tablet TAKE 1 TABLET AT BEDTIME AS NEEDED (Patient taking differently: TAKE 1 TABLET AT BEDTIME AS NEEDED FOR SLEEP) 30 tablet 5  . orphenadrine (NORFLEX) 100 MG tablet Take 1 tablet (100 mg total) by mouth 2 (two) times daily as needed for muscle spasms. 30 tablet 0    Results for orders placed or performed during the hospital encounter of 04/05/2015 (from the past 48 hour(s))  Comprehensive metabolic panel  Status: Abnormal   Collection Time: 04/07/2015 10:42 PM  Result Value Ref Range   Sodium 140 135 - 145 mmol/L   Potassium 4.4 3.5 - 5.1 mmol/L   Chloride 110 101 - 111 mmol/L   CO2 23 22 - 32 mmol/L   Glucose, Bld 161 (H) 65 - 99 mg/dL   BUN 18 6 - 20 mg/dL   Creatinine, Ser 1.19 0.61 - 1.24 mg/dL   Calcium 8.7 (L) 8.9 - 10.3 mg/dL   Total Protein 5.5 (L) 6.5 - 8.1 g/dL   Albumin 3.2 (L) 3.5 - 5.0 g/dL   AST 21 15 - 41 U/L   ALT 15 (L) 17 - 63 U/L   Alkaline Phosphatase 61 38 - 126 U/L   Total Bilirubin 0.9 0.3 - 1.2 mg/dL   GFR calc non Af Amer 55 (L) >60 mL/min   GFR calc Af Amer >60 >60 mL/min    Comment: (NOTE) The eGFR has been calculated using the CKD EPI equation. This calculation has not been validated in all clinical situations. eGFR's persistently <60 mL/min signify possible Chronic Kidney Disease.    Anion gap 7 5 - 15  CBC with Differential     Status: Abnormal   Collection Time: 04/04/2015 10:42 PM  Result Value Ref Range   WBC 17.7 (H) 4.0 - 10.5 K/uL   RBC 4.84 4.22 - 5.81 MIL/uL   Hemoglobin 14.9 13.0 - 17.0 g/dL   HCT 43.9 39.0 - 52.0 %   MCV 90.7 78.0 - 100.0 fL   MCH 30.8 26.0 - 34.0 pg   MCHC 33.9 30.0 - 36.0 g/dL   RDW 13.8 11.5 - 15.5 %   Platelets 111 (L) 150 - 400 K/uL    Comment: SPECIMEN CHECKED FOR CLOTS REPEATED TO VERIFY PLATELET COUNT CONFIRMED BY SMEAR    Neutrophils Relative % 86 %   Neutro Abs 15.3 (H) 1.7 - 7.7 K/uL   Lymphocytes Relative 7 %    Lymphs Abs 1.3 0.7 - 4.0 K/uL   Monocytes Relative 6 %   Monocytes Absolute 1.1 (H) 0.1 - 1.0 K/uL   Eosinophils Relative 0 %   Eosinophils Absolute 0.0 0.0 - 0.7 K/uL   Basophils Relative 0 %   Basophils Absolute 0.0 0.0 - 0.1 K/uL  Protime-INR     Status: Abnormal   Collection Time: 04/13/2015 10:42 PM  Result Value Ref Range   Prothrombin Time 15.4 (H) 11.6 - 15.2 seconds   INR 1.21 0.00 - 1.49  APTT     Status: None   Collection Time: 04/21/2015 10:42 PM  Result Value Ref Range   aPTT 30 24 - 37 seconds   Dg Chest 1 View  03/29/2015  CLINICAL DATA:  Left rib pain after falling down steps today. Left shoulder and upper arm pain and deformity. EXAM: CHEST 1 VIEW COMPARISON:  02/03/2015 FINDINGS: Enlarged cardiac silhouette with a mild increase in size. Diffusely enlarged and tortuous thoracic aorta without significant change. Clear lungs. Interval mildly comminuted left humeral head and neck fracture with anterior dislocation of the humeral head relative to the glenoid. Mild right shoulder degenerative changes. No visible rib fracture or pneumothorax. IMPRESSION: 1. Comminuted left humeral head and neck fracture with dislocation of the humeral head. 2. Progressive cardiomegaly. Electronically Signed   By: Claudie Revering M.D.   On: 04/08/2015 22:00   Dg Shoulder 1v Left  03/27/2015  CLINICAL DATA:  [place] down 7 steps today. EXAM: LEFT SHOULDER - 1 VIEW COMPARISON:  None. FINDINGS: There are significant study limitations due to positioning challenges. There appears to be a fracture dislocation of the left shoulder. The humeral head is inferior to the glenoid and there appears to be foreshortening about a comminuted proximal humeral fracture. IMPRESSION: Fracture dislocation about the left shoulder. Electronically Signed   By: Andreas Newport M.D.   On: 04/21/2015 22:07   Dg Elbow 2 Views Left  04/20/2015  CLINICAL DATA:  Left shoulder and proximal humerus pain and deformity following a fall  down steps today. EXAM: LEFT ELBOW - 2 VIEW COMPARISON:  Left humerus radiographs obtained today. FINDINGS: The patient was unable to cooperate for an adequate lateral view due to the previously described proximal fracture/dislocation of the humerus. The elbow has a grossly normal appearance with the exception of mild coronoid process spur formation. No visible fracture, dislocation or effusion. IMPRESSION: Somewhat limited examination with no gross fracture or effusion. Electronically Signed   By: Claudie Revering M.D.   On: 03/31/2015 22:03   Ct Head Wo Contrast  03/27/2015  CLINICAL DATA:  Intracranial hemorrhage.  Increasing lethargy. EXAM: CT HEAD WITHOUT CONTRAST TECHNIQUE: Contiguous axial images were obtained from the base of the skull through the vertex without intravenous contrast. COMPARISON:  03/28/2015 at 21:50 FINDINGS: The intraparenchymal left frontoparietal convexity hemorrhage has enlarged. It currently measures 3.8 x 6.4 by 5 cm and previously measured 2.8 x 4.4 x 3 cm. There is increasing mass effect on the left lateral ventricular system and increasing sulcal effacement there now is 6 mm left-to-right midline shift. There also is increasing volume of subarachnoid hemorrhage. No intraventricular extension. Basal cisterns remain patent. IMPRESSION: Significant enlargement of the intraparenchymal hemorrhage. Increasing subarachnoid hemorrhage. Worsening mass effect and midline shift. Critical Value/emergent results were called by telephone at the time of interpretation on 03/27/2015 at 12:35 am to Dr. Venora Maples, who verbally acknowledged these results. Electronically Signed   By: Andreas Newport M.D.   On: 03/27/2015 00:35   Ct Head Wo Contrast  03/27/2015  CLINICAL DATA:  81 year old male with fall and trauma to the back of the head EXAM: CT HEAD WITHOUT CONTRAST CT CERVICAL SPINE WITHOUT CONTRAST TECHNIQUE: Multidetector CT imaging of the head and cervical spine was performed following the  standard protocol without intravenous contrast. Multiplanar CT image reconstructions of the cervical spine were also generated. COMPARISON:  The the head CT dated 02/09/2002 and MRI dated 05/26/2009 FINDINGS: CT HEAD FINDINGS There is a 2.8 x 4.4 cm intraparenchymal hemorrhage centered in the left frontoparietal convexity and centrum semiovale. There is mild white matter edema in the surrounding brain parenchyma with mild mass effect and effacement of the sulci. Small bilateral parietal lobe subarachnoid hemorrhages noted. Small scattered bilateral cortical hyperdensity involving the right occipital lobe as well as bilateral frontal lobes may represent cortical contusions versus small subarachnoid hemorrhages. No intraventricular hemorrhage identified. There is no midline shift. There is slight prominence of the ventricles and sulci compatible with age-related volume loss. Mild periventricular and deep white matter hypodensities represent chronic microvascular ischemic changes. The visualized paranasal sinuses and mastoid air cells are well aerated. The calvarium is intact. CT CERVICAL SPINE FINDINGS There is no acute fracture or subluxation of the cervical spine.There is osteopenia with multilevel degenerative changes. There is mild loss of C6 vertebral body height, likely chronic.The odontoid and spinous processes are intact.There is normal anatomic alignment of the C1-C2 lateral masses. The visualized soft tissues appear unremarkable. IMPRESSION: Intraparenchymal hemorrhage involving the left frontoparietal convexity with surrounding  edema and mild mass effect. No midline shift. Small bilateral parietal subarachnoid hemorrhages. Small scattered cortical contusions predominantly involving the right parietal and bilateral frontal lobes. No intraventricular hemorrhage. No acute/traumatic cervical spine pathology. Critical Value/emergent results were called by telephone at the time of interpretation on 04/17/2015 at  10:24 pm to Dr. Deno Etienne , who verbally acknowledged these results. Electronically Signed   By: Anner Crete M.D.   On: 04/17/2015 22:26   Ct Chest W Contrast  04/16/2015  CLINICAL DATA:  Status post fall with left-sided pain. EXAM: CT CHEST WITH CONTRAST CT ABDOMEN AND PELVIS WITH CONTRAST TECHNIQUE: Multidetector CT imaging of the chest was performed during intravenous contrast administration. Multidetector CT imaging of the abdomen and pelvis was performed following the standard protocol after bolus administration of intravenous contrast. CONTRAST:  37m OMNIPAQUE IOHEXOL 300 MG/ML  SOLN COMPARISON:  Chest radiograph 04/20/2015 FINDINGS: CT CHEST FINDINGS Mediastinum/Lymph Nodes: No masses, pathologically enlarged lymph nodes. The heart is enlarged. There is fusiform dilation of the ascending aorta measuring 4.6 cm. Heavy atherosclerotic disease and tortuosity of the aorta is noted. There is a saccular aneurysm of the mid descending thoracic aorta measuring 4.3 cm in maximum transverse diameter. Lungs/Pleura: No pulmonary mass, infiltrate, or effusion. Emphysematous changes of the lungs are noted. Musculoskeletal: No chest wall mass or suspicious bone lesions identified. There is a severely comminuted fracture of the left humeral head and neck with inferior/anterior dislocation of the humeral head in relation to the glenoid. CT ABDOMEN PELVIS FINDINGS Hepatobiliary: No masses or other significant abnormality. Pancreas: No mass, inflammatory changes, or other significant abnormality. Spleen: Within normal limits in size and appearance. Adrenals/Urinary Tract: No masses identified. No evidence of hydronephrosis. 2.2 cm right inferior pole renal cyst is seen. Bilateral, left worse than right renal cortical thinning is seen. Stomach/Bowel: No evidence of obstruction, inflammatory process, or abnormal fluid collections. Vascular/Lymphatic: No pathologically enlarged lymph nodes. There is an abdominal aortic  aneurysm at the level of the takeoff of the renal arteries which measures 4.6 cm in maximum transverse diameter. The aneurysmal sac measures approximately 5.5 cm in anterior to posterior dimension. There is a eccentric thrombus within the aneurysmal sac along the right aortic wall. Celiac trunk, superior mesenteric artery, bilateral renal arteries are opacified. Bilateral renal arteries arise from the aneurysmal portion of the aorta. Soft tissue thickening is noted along the aortic wall inferior to the aneurysmal dilation, which may represent a noncalcified plaque or intramural thrombus. 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 A small area of dissection is also seen within the aneurysmal portion of the superior mesenteric artery, best seen on image 52, coronal images. Reproductive: The urinary bladder is distended. The prostate gland is enlarged and lobular measuring up to 5.4 cm, impressing on the posterior wall of the urinary bladder. Other: None. Musculoskeletal:  No suspicious bone lesions identified. IMPRESSION: Fusiform dilation of the ascending aorta measuring 4.6 cm in maximum diameter. 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 Abdominal aortic aneurysm measuring 4.6 cm in maximum transverse diameter at the level of the renal arteries. Large intramural thrombus is seen within the aneurysmal sac. Thickening of the abdominal aortic wall inferior to the aneurysmal dilation, which may represent soft atherosclerotic plaque. Posttraumatic changes are not excluded although felt less likely in the absence of other signs of intra-abdominal injury. Aneurysmal dilation with small area of dissection of the  proximal superior mesenteric artery. Background of heavy atherosclerotic disease. Enlarged globular prostate gland, impressing on the posterior wall of the urinary bladder, which is  distended. Bilateral renal cortical thinning. Comminuted fracture dislocation of the left humerus. These results were called by telephone at the time of interpretation on 04/18/2015 at 11:03 pm to Dr. Deno Etienne , who verbally acknowledged these results. Electronically Signed   By: Fidela Salisbury M.D.   On: 04/11/2015 23:05   Ct Cervical Spine Wo Contrast  04/03/2015  CLINICAL DATA:  81 year old male with fall and trauma to the back of the head EXAM: CT HEAD WITHOUT CONTRAST CT CERVICAL SPINE WITHOUT CONTRAST TECHNIQUE: Multidetector CT imaging of the head and cervical spine was performed following the standard protocol without intravenous contrast. Multiplanar CT image reconstructions of the cervical spine were also generated. COMPARISON:  The the head CT dated 02/09/2002 and MRI dated 05/26/2009 FINDINGS: CT HEAD FINDINGS There is a 2.8 x 4.4 cm intraparenchymal hemorrhage centered in the left frontoparietal convexity and centrum semiovale. There is mild white matter edema in the surrounding brain parenchyma with mild mass effect and effacement of the sulci. Small bilateral parietal lobe subarachnoid hemorrhages noted. Small scattered bilateral cortical hyperdensity involving the right occipital lobe as well as bilateral frontal lobes may represent cortical contusions versus small subarachnoid hemorrhages. No intraventricular hemorrhage identified. There is no midline shift. There is slight prominence of the ventricles and sulci compatible with age-related volume loss. Mild periventricular and deep white matter hypodensities represent chronic microvascular ischemic changes. The visualized paranasal sinuses and mastoid air cells are well aerated. The calvarium is intact. CT CERVICAL SPINE FINDINGS There is no acute fracture or subluxation of the cervical spine.There is osteopenia with multilevel degenerative changes. There is mild loss of C6 vertebral body height, likely chronic.The odontoid and spinous  processes are intact.There is normal anatomic alignment of the C1-C2 lateral masses. The visualized soft tissues appear unremarkable. IMPRESSION: Intraparenchymal hemorrhage involving the left frontoparietal convexity with surrounding edema and mild mass effect. No midline shift. Small bilateral parietal subarachnoid hemorrhages. Small scattered cortical contusions predominantly involving the right parietal and bilateral frontal lobes. No intraventricular hemorrhage. No acute/traumatic cervical spine pathology. Critical Value/emergent results were called by telephone at the time of interpretation on 04/12/2015 at 10:24 pm to Dr. Deno Etienne , who verbally acknowledged these results. Electronically Signed   By: Anner Crete M.D.   On: 04/18/2015 22:26   Ct Abdomen Pelvis W Contrast  04/19/2015  CLINICAL DATA:  Status post fall with left-sided pain. EXAM: CT CHEST WITH CONTRAST CT ABDOMEN AND PELVIS WITH CONTRAST TECHNIQUE: Multidetector CT imaging of the chest was performed during intravenous contrast administration. Multidetector CT imaging of the abdomen and pelvis was performed following the standard protocol after bolus administration of intravenous contrast. CONTRAST:  11m OMNIPAQUE IOHEXOL 300 MG/ML  SOLN COMPARISON:  Chest radiograph 03/25/2015 FINDINGS: CT CHEST FINDINGS Mediastinum/Lymph Nodes: No masses, pathologically enlarged lymph nodes. The heart is enlarged. There is fusiform dilation of the ascending aorta measuring 4.6 cm. Heavy atherosclerotic disease and tortuosity of the aorta is noted. There is a saccular aneurysm of the mid descending thoracic aorta measuring 4.3 cm in maximum transverse diameter. Lungs/Pleura: No pulmonary mass, infiltrate, or effusion. Emphysematous changes of the lungs are noted. Musculoskeletal: No chest wall mass or suspicious bone lesions identified. There is a severely comminuted fracture of the left humeral head and neck with inferior/anterior dislocation of the  humeral head in relation to the glenoid. CT ABDOMEN  PELVIS FINDINGS Hepatobiliary: No masses or other significant abnormality. Pancreas: No mass, inflammatory changes, or other significant abnormality. Spleen: Within normal limits in size and appearance. Adrenals/Urinary Tract: No masses identified. No evidence of hydronephrosis. 2.2 cm right inferior pole renal cyst is seen. Bilateral, left worse than right renal cortical thinning is seen. Stomach/Bowel: No evidence of obstruction, inflammatory process, or abnormal fluid collections. Vascular/Lymphatic: No pathologically enlarged lymph nodes. There is an abdominal aortic aneurysm at the level of the takeoff of the renal arteries which measures 4.6 cm in maximum transverse diameter. The aneurysmal sac measures approximately 5.5 cm in anterior to posterior dimension. There is a eccentric thrombus within the aneurysmal sac along the right aortic wall. Celiac trunk, superior mesenteric artery, bilateral renal arteries are opacified. Bilateral renal arteries arise from the aneurysmal portion of the aorta. Soft tissue thickening is noted along the aortic wall inferior to the aneurysmal dilation, which may represent a noncalcified plaque or intramural thrombus. 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 A small area of dissection is also seen within the aneurysmal portion of the superior mesenteric artery, best seen on image 52, coronal images. Reproductive: The urinary bladder is distended. The prostate gland is enlarged and lobular measuring up to 5.4 cm, impressing on the posterior wall of the urinary bladder. Other: None. Musculoskeletal:  No suspicious bone lesions identified. IMPRESSION: Fusiform dilation of the ascending aorta measuring 4.6 cm in maximum diameter. 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 Abdominal aortic aneurysm  measuring 4.6 cm in maximum transverse diameter at the level of the renal arteries. Large intramural thrombus is seen within the aneurysmal sac. Thickening of the abdominal aortic wall inferior to the aneurysmal dilation, which may represent soft atherosclerotic plaque. Posttraumatic changes are not excluded although felt less likely in the absence of other signs of intra-abdominal injury. 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 Background of heavy atherosclerotic disease. Enlarged globular prostate gland, impressing on the posterior wall of the urinary bladder, which is distended. Bilateral renal cortical thinning. Comminuted fracture dislocation of the left humerus. These results were called by telephone at the time of interpretation on 04/05/2015 at 11:03 pm to Dr. Deno Etienne , who verbally acknowledged these results. Electronically Signed   By: Fidela Salisbury M.D.   On: 04/16/2015 23:05   Dg Humerus Left  04/16/2015  CLINICAL DATA:  Left shoulder and upper arm pain and deformity after falling down steps today. EXAM: LEFT HUMERUS - 2+ VIEW COMPARISON:  Portable chest obtained today. Left shoulder radiograph obtained today. FINDINGS: Again demonstrated is a comminuted fracture of the left humeral head and neck with inferior dislocation of the humeral head relative to the glenoid. Associated soft tissue swelling. No other fractures or dislocations. IMPRESSION: Comminuted fracture of the left humeral head and neck with inferior dislocation of the humeral head relative to the glenoid. Electronically Signed   By: Claudie Revering M.D.   On: 04/03/2015 22:01    Review of Systems  Unable to perform ROS   Blood pressure 130/73, pulse 109, temperature 97.4 F (36.3 C), temperature source Oral, resp. rate 24, height 6' 1"  (1.854 m), weight 90.3 kg (199 lb 1.2 oz), SpO2 97 %. Physical Exam  Constitutional: He appears well-developed and well-nourished.  HENT:   Hematoma left scalp  Eyes: No scleral icterus.  Neck: Normal range of motion. No  tracheal deviation present.  Cardiovascular: Normal rate.   Respiratory: Effort normal and breath sounds normal.  GI: Soft. Bowel sounds are normal. He exhibits no distension. There is no tenderness.  Musculoskeletal:  Left shoulder swelling and deformity  Neurological: GCS eye subscore is 1. GCS verbal subscore is 1. GCS motor subscore is 1.  Skin: Skin is warm and dry.  Psychiatric: He has a normal mood and affect. His behavior is normal. Judgment and thought content normal.     Assessment/Plan Intracranial hemorrhage status post fall  Fracture proximal left humerus with dislocation  Respiratory failure  Admission to ICU  Neurosurgery consultation  Orthopedics consultation  Situation discussed with family. Prognosis poor given severity of head injury for survival.     Patient Active Problem List   Diagnosis Date Noted  . ICH (intracerebral hemorrhage) ([place]) 03/27/2015  . Abnormal nuclear stress test 02/04/2015  . Muscle spasm of left upper back 10/18/2014  . Constipation 05/10/2014  . Insomnia 04/07/2014  . GERD (gastroesophageal reflux disease) 04/07/2014  . Migraine without aura 04/07/2014  . History of abdominal aortic aneurysm (AAA) repair 02/24/2014  . Ruptured abdominal aortic aneurysm (AAA) ([place]) 02/24/2014  . Encounter for surgical follow-up care 02/24/2014  . Dysphagia 07/22/2013  . History of colonic polyps 07/22/2013  . HLD (hyperlipidemia) 01/22/2013  . Thrombocytopenia ([place]) 09/14/2012  . Calculus of gallbladder 09/14/2012  . Chronic systolic CHF (congestive heart failure)- EF 25-30%   . Degeneration macular 05/06/2012  . Abdominal aortic aneurysm (AAA) ([place]) 04/25/2012  . Headache, migraine 02/25/2012  . Chronic obstructive pulmonary disease ([place]) 12/27/2011  . BP (high blood pressure) 12/27/2011  . Calculus of kidney 12/27/2011  . AAA (abdominal aortic aneurysm)  ([place]) 09/18/2011  . Abnormal cardiovascular function study 05/21/2011  . Lung nodule, solitary 10/27/2010  . Aortic regurgitation 07/24/2010  . Aortic valve defect 07/24/2010  . COPD (chronic obstructive pulmonary disease) ([place])   . LBBB (left bundle branch block) 12/20/2009  . Block, bundle branch, left 12/20/2009  . ERECTILE DYSFUNCTION 12/12/2009  . Primary pulmonary hypertension ([place]) 06/02/2007  . Pulmonary fibrosis ([place]) 04/24/2007  . Postinflammatory pulmonary fibrosis ([place]) 04/24/2007  . Other testicular hypofunction 02/11/2007  . Coronary atherosclerosis 10/09/2006  . Peripheral vascular disease ([place]) 10/09/2006  . BPH (benign prostatic hyperplasia) 10/09/2006  . Essential hypertension 10/08/2006  . CKD (chronic kidney disease), stage III 10/08/2006  . Disorder resulting from impaired renal function 10/08/2006  . Personal history of urinary calculi 10/08/2006    Vartan Kerins A. 03/27/2015, 1:18 AM

## 2015-03-27 NOTE — Progress Notes (Signed)
Prolonged discussion with family, patient had a devastating ICH, he is not a surgical candidate, on exam his respiratory effort is quite negligible.  I see no reasonable chance of recovery here.  Spoke with wife then two sons on three separate occasions.  The sons understand that the patient is actively dying and that we have no chance at recovery.  Recommended DNR status.  Wife is not accepting that very well.  Family is speaking to wife right now.  Will not start pressors at this time until family attempts to convince wife of DNR status.  Will continue full code status for now pending further discussion.  The patient is critically ill with multiple organ systems failure and requires high complexity decision making for assessment and support, frequent evaluation and titration of therapies, application of advanced monitoring technologies and extensive interpretation of multiple databases.   Critical Care Time devoted to patient care services described in this note is  45  Minutes. This time reflects time of care of this signee Dr Jennet Maduro. This critical care time does not reflect procedure time, or teaching time or supervisory time of PA/NP/Med student/Med Resident etc but could involve care discussion time.  Rush Farmer, M.D. [facility] Pulmonary/Critical Care Medicine. Pager: [phone]. After hours pager: [phone].

## 2015-03-27 NOTE — Progress Notes (Signed)
Dr Georgette Dover notified of pt with urine output ranging from 25-38ml/hr no new orders received. Also notified of pt with steadily increasing temp at 101.3 currently, orders given for tylenol via OG tube. Will continue to monitor.

## 2015-03-27 NOTE — ED Notes (Signed)
Checking on pt, hard to arouse, not speaking, not opening eyes, hard to arouse even with sternal rub, EDP at bedside evaluating

## 2015-03-27 NOTE — Progress Notes (Signed)
ETT advanced 4cm. Secured @ 28ATL. Dr. Diona Fanti orderd ETT advancement.

## 2015-03-27 NOTE — Progress Notes (Signed)
Subjective: Patient remains intubated. Minimal reaction - withdraws on left; no right-sided movement Repeat CT shows interval progression of the intraparenchymal hemorrhage Poor prognosis - Neurosurgeon has discussed thoroughly with family   Objective: Vital signs in last 24 hours: Temp:  [97.4 F (36.3 C)-98.6 F (37 C)] 98.6 F (37 C) (12/04 0800) Pulse Rate:  [71-109] 82 (12/04 0800) Resp:  [14-33] 14 (12/04 0800) BP: (79-156)/(54-113) 98/63 mmHg (12/04 0800) SpO2:  [89 %-100 %] 100 % (12/04 0800) FiO2 (%):  [40 %-60 %] 40 % (12/04 0800) Weight:  [87 kg (191 lb 12.8 oz)-90.3 kg (199 lb 1.2 oz)] 87 kg (191 lb 12.8 oz) (12/04 0400) Last BM Date:  (pta)  Intake/Output from previous day: 12/03 0701 - 12/04 0700 In: 145 [I.V.:145] Out: 465 [Urine:465] Intake/Output this shift: Total I/O In: 100 [I.V.:100] Out: -   General appearance: Intubated; minimal response; withdraws on left Resp: clear to auscultation bilaterally Cardio: regular rate and rhythm, S1, S2 normal, no murmur, click, rub or gallop GI: soft, no masses Left shoulder - will place in sling;   Lab Results:   Recent Labs  04/12/2015 2242 03/27/15 0401  WBC 17.7* 16.3*  HGB 14.9 13.8  HCT 43.9 42.4  PLT 111* 122*   BMET  Recent Labs  04/04/2015 2242 03/27/15 0401  NA 140 141  K 4.4 4.9  CL 110 109  CO2 23 26  GLUCOSE 161* 213*  BUN 18 20  CREATININE 1.19 1.48*  CALCIUM 8.7* 8.8*   PT/INR  Recent Labs  04/04/2015 2242  LABPROT 15.4*  INR 1.21   ABG  Recent Labs  03/27/15 0403  PHART 7.369  HCO3 23.5    Studies/Results: Dg Chest 1 View  04/22/2015  CLINICAL DATA:  Left rib pain after falling down steps today. Left shoulder and upper arm pain and deformity. EXAM: CHEST 1 VIEW COMPARISON:  02/03/2015 FINDINGS: Enlarged cardiac silhouette with a mild increase in size. Diffusely enlarged and tortuous thoracic aorta without significant change. Clear lungs. Interval mildly comminuted left  humeral head and neck fracture with anterior dislocation of the humeral head relative to the glenoid. Mild right shoulder degenerative changes. No visible rib fracture or pneumothorax. IMPRESSION: 1. Comminuted left humeral head and neck fracture with dislocation of the humeral head. 2. Progressive cardiomegaly. Electronically Signed   By: Claudie Revering M.D.   On: 04/02/2015 22:00   Dg Shoulder 1v Left  04/11/2015  CLINICAL DATA:  [place] down 7 steps today. EXAM: LEFT SHOULDER - 1 VIEW COMPARISON:  None. FINDINGS: There are significant study limitations due to positioning challenges. There appears to be a fracture dislocation of the left shoulder. The humeral head is inferior to the glenoid and there appears to be foreshortening about a comminuted proximal humeral fracture. IMPRESSION: Fracture dislocation about the left shoulder. Electronically Signed   By: Andreas Newport M.D.   On: 03/29/2015 22:07   Dg Elbow 2 Views Left  04/15/2015  CLINICAL DATA:  Left shoulder and proximal humerus pain and deformity following a fall down steps today. EXAM: LEFT ELBOW - 2 VIEW COMPARISON:  Left humerus radiographs obtained today. FINDINGS: The patient was unable to cooperate for an adequate lateral view due to the previously described proximal fracture/dislocation of the humerus. The elbow has a grossly normal appearance with the exception of mild coronoid process spur formation. No visible fracture, dislocation or effusion. IMPRESSION: Somewhat limited examination with no gross fracture or effusion. Electronically Signed   By: Percell Locus.D.  On: 03/31/2015 22:03   Ct Head Without Contrast  03/27/2015  CLINICAL DATA:  Status post fall. Enlarging intracerebral hematoma. Follow-up. EXAM: CT HEAD WITHOUT CONTRAST TECHNIQUE: Contiguous axial images were obtained from the base of the skull through the vertex without intravenous contrast. COMPARISON:  Prior studies yesterday and earlier today. Most recent prior study 8  hours earlier. FINDINGS: There has been further enlargement of the large left frontal parietal intraparenchymal hematoma. This now measures up to 7.5 x 4.3 cm (previously 6.4 x 3.8 cm). There is increased surrounding vasogenic edema and midline shift by approximately 12 mm. Multiple foci of subarachnoid hemorrhage are present along both convexities and the left sylvian fissure. There is probable developing mild generalized cerebral edema. No hydrocephalus. The patient is intubated. The visualized paranasal sinuses, mastoid air cells and middle ears are clear. The calvarium is intact. IMPRESSION: Further enlargement of large left cerebral intraparenchymal hematoma with progressive mass effect and midline shift. Probable developing generalized cerebral edema. Critical Value/emergent results were called by telephone at the time of interpretation on 03/27/2015 at 8:27 am to patient's nurse, Joellen Jersey, who verbally acknowledged these results and will inform Dr. Brantley Stage. Electronically Signed   By: Richardean Sale M.D.   On: 03/27/2015 08:31   Ct Head Wo Contrast  03/27/2015  CLINICAL DATA:  Intracranial hemorrhage.  Increasing lethargy. EXAM: CT HEAD WITHOUT CONTRAST TECHNIQUE: Contiguous axial images were obtained from the base of the skull through the vertex without intravenous contrast. COMPARISON:  03/25/2015 at 21:50 FINDINGS: The intraparenchymal left frontoparietal convexity hemorrhage has enlarged. It currently measures 3.8 x 6.4 by 5 cm and previously measured 2.8 x 4.4 x 3 cm. There is increasing mass effect on the left lateral ventricular system and increasing sulcal effacement there now is 6 mm left-to-right midline shift. There also is increasing volume of subarachnoid hemorrhage. No intraventricular extension. Basal cisterns remain patent. IMPRESSION: Significant enlargement of the intraparenchymal hemorrhage. Increasing subarachnoid hemorrhage. Worsening mass effect and midline shift. Critical Value/emergent  results were called by telephone at the time of interpretation on 03/27/2015 at 12:35 am to Dr. Venora Maples, who verbally acknowledged these results. Electronically Signed   By: Andreas Newport M.D.   On: 03/27/2015 00:35   Ct Head Wo Contrast  04/20/2015  CLINICAL DATA:  81 year old male with fall and trauma to the back of the head EXAM: CT HEAD WITHOUT CONTRAST CT CERVICAL SPINE WITHOUT CONTRAST TECHNIQUE: Multidetector CT imaging of the head and cervical spine was performed following the standard protocol without intravenous contrast. Multiplanar CT image reconstructions of the cervical spine were also generated. COMPARISON:  The the head CT dated 02/09/2002 and MRI dated 05/26/2009 FINDINGS: CT HEAD FINDINGS There is a 2.8 x 4.4 cm intraparenchymal hemorrhage centered in the left frontoparietal convexity and centrum semiovale. There is mild white matter edema in the surrounding brain parenchyma with mild mass effect and effacement of the sulci. Small bilateral parietal lobe subarachnoid hemorrhages noted. Small scattered bilateral cortical hyperdensity involving the right occipital lobe as well as bilateral frontal lobes may represent cortical contusions versus small subarachnoid hemorrhages. No intraventricular hemorrhage identified. There is no midline shift. There is slight prominence of the ventricles and sulci compatible with age-related volume loss. Mild periventricular and deep white matter hypodensities represent chronic microvascular ischemic changes. The visualized paranasal sinuses and mastoid air cells are well aerated. The calvarium is intact. CT CERVICAL SPINE FINDINGS There is no acute fracture or subluxation of the cervical spine.There is osteopenia with multilevel degenerative changes. There  is mild loss of C6 vertebral body height, likely chronic.The odontoid and spinous processes are intact.There is normal anatomic alignment of the C1-C2 lateral masses. The visualized soft tissues appear  unremarkable. IMPRESSION: Intraparenchymal hemorrhage involving the left frontoparietal convexity with surrounding edema and mild mass effect. No midline shift. Small bilateral parietal subarachnoid hemorrhages. Small scattered cortical contusions predominantly involving the right parietal and bilateral frontal lobes. No intraventricular hemorrhage. No acute/traumatic cervical spine pathology. Critical Value/emergent results were called by telephone at the time of interpretation on 03/29/2015 at 10:24 pm to Dr. Deno Etienne , who verbally acknowledged these results. Electronically Signed   By: Anner Crete M.D.   On: 04/11/2015 22:26   Ct Chest W Contrast  04/23/2015  CLINICAL DATA:  Status post fall with left-sided pain. EXAM: CT CHEST WITH CONTRAST CT ABDOMEN AND PELVIS WITH CONTRAST TECHNIQUE: Multidetector CT imaging of the chest was performed during intravenous contrast administration. Multidetector CT imaging of the abdomen and pelvis was performed following the standard protocol after bolus administration of intravenous contrast. CONTRAST:  25mL OMNIPAQUE IOHEXOL 300 MG/ML  SOLN COMPARISON:  Chest radiograph 04/02/2015 FINDINGS: CT CHEST FINDINGS Mediastinum/Lymph Nodes: No masses, pathologically enlarged lymph nodes. The heart is enlarged. There is fusiform dilation of the ascending aorta measuring 4.6 cm. Heavy atherosclerotic disease and tortuosity of the aorta is noted. There is a saccular aneurysm of the mid descending thoracic aorta measuring 4.3 cm in maximum transverse diameter. Lungs/Pleura: No pulmonary mass, infiltrate, or effusion. Emphysematous changes of the lungs are noted. Musculoskeletal: No chest wall mass or suspicious bone lesions identified. There is a severely comminuted fracture of the left humeral head and neck with inferior/anterior dislocation of the humeral head in relation to the glenoid. CT ABDOMEN PELVIS FINDINGS Hepatobiliary: No masses or other significant abnormality.  Pancreas: No mass, inflammatory changes, or other significant abnormality. Spleen: Within normal limits in size and appearance. Adrenals/Urinary Tract: No masses identified. No evidence of hydronephrosis. 2.2 cm right inferior pole renal cyst is seen. Bilateral, left worse than right renal cortical thinning is seen. Stomach/Bowel: No evidence of obstruction, inflammatory process, or abnormal fluid collections. Vascular/Lymphatic: No pathologically enlarged lymph nodes. There is an abdominal aortic aneurysm at the level of the takeoff of the renal arteries which measures 4.6 cm in maximum transverse diameter. The aneurysmal sac measures approximately 5.5 cm in anterior to posterior dimension. There is a eccentric thrombus within the aneurysmal sac along the right aortic wall. Celiac trunk, superior mesenteric artery, bilateral renal arteries are opacified. Bilateral renal arteries arise from the aneurysmal portion of the aorta. Soft tissue thickening is noted along the aortic wall inferior to the aneurysmal dilation, which may represent a noncalcified plaque or intramural thrombus. 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 A small area of dissection is also seen within the aneurysmal portion of the superior mesenteric artery, best seen on image 52, coronal images. Reproductive: The urinary bladder is distended. The prostate gland is enlarged and lobular measuring up to 5.4 cm, impressing on the posterior wall of the urinary bladder. Other: None. Musculoskeletal:  No suspicious bone lesions identified. IMPRESSION: Fusiform dilation of the ascending aorta measuring 4.6 cm in maximum diameter. 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 Abdominal aortic aneurysm measuring 4.6 cm in maximum transverse diameter at the level of the renal arteries. Large intramural thrombus is seen within the  aneurysmal sac. Thickening of  the abdominal aortic wall inferior to the aneurysmal dilation, which may represent soft atherosclerotic plaque. Posttraumatic changes are not excluded although felt less likely in the absence of other signs of intra-abdominal injury. 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 Background of heavy atherosclerotic disease. Enlarged globular prostate gland, impressing on the posterior wall of the urinary bladder, which is distended. Bilateral renal cortical thinning. Comminuted fracture dislocation of the left humerus. These results were called by telephone at the time of interpretation on 04/22/2015 at 11:03 pm to Dr. Deno Etienne , who verbally acknowledged these results. Electronically Signed   By: Fidela Salisbury M.D.   On: 04/02/2015 23:05   Ct Cervical Spine Wo Contrast  04/18/2015  CLINICAL DATA:  81 year old male with fall and trauma to the back of the head EXAM: CT HEAD WITHOUT CONTRAST CT CERVICAL SPINE WITHOUT CONTRAST TECHNIQUE: Multidetector CT imaging of the head and cervical spine was performed following the standard protocol without intravenous contrast. Multiplanar CT image reconstructions of the cervical spine were also generated. COMPARISON:  The the head CT dated 02/09/2002 and MRI dated 05/26/2009 FINDINGS: CT HEAD FINDINGS There is a 2.8 x 4.4 cm intraparenchymal hemorrhage centered in the left frontoparietal convexity and centrum semiovale. There is mild white matter edema in the surrounding brain parenchyma with mild mass effect and effacement of the sulci. Small bilateral parietal lobe subarachnoid hemorrhages noted. Small scattered bilateral cortical hyperdensity involving the right occipital lobe as well as bilateral frontal lobes may represent cortical contusions versus small subarachnoid hemorrhages. No intraventricular hemorrhage identified. There is no midline shift. There is slight prominence of the  ventricles and sulci compatible with age-related volume loss. Mild periventricular and deep white matter hypodensities represent chronic microvascular ischemic changes. The visualized paranasal sinuses and mastoid air cells are well aerated. The calvarium is intact. CT CERVICAL SPINE FINDINGS There is no acute fracture or subluxation of the cervical spine.There is osteopenia with multilevel degenerative changes. There is mild loss of C6 vertebral body height, likely chronic.The odontoid and spinous processes are intact.There is normal anatomic alignment of the C1-C2 lateral masses. The visualized soft tissues appear unremarkable. IMPRESSION: Intraparenchymal hemorrhage involving the left frontoparietal convexity with surrounding edema and mild mass effect. No midline shift. Small bilateral parietal subarachnoid hemorrhages. Small scattered cortical contusions predominantly involving the right parietal and bilateral frontal lobes. No intraventricular hemorrhage. No acute/traumatic cervical spine pathology. Critical Value/emergent results were called by telephone at the time of interpretation on 04/17/2015 at 10:24 pm to Dr. Deno Etienne , who verbally acknowledged these results. Electronically Signed   By: Anner Crete M.D.   On: 04/18/2015 22:26   Ct Abdomen Pelvis W Contrast  04/14/2015  CLINICAL DATA:  Status post fall with left-sided pain. EXAM: CT CHEST WITH CONTRAST CT ABDOMEN AND PELVIS WITH CONTRAST TECHNIQUE: Multidetector CT imaging of the chest was performed during intravenous contrast administration. Multidetector CT imaging of the abdomen and pelvis was performed following the standard protocol after bolus administration of intravenous contrast. CONTRAST:  3mL OMNIPAQUE IOHEXOL 300 MG/ML  SOLN COMPARISON:  Chest radiograph 04/18/2015 FINDINGS: CT CHEST FINDINGS Mediastinum/Lymph Nodes: No masses, pathologically enlarged lymph nodes. The heart is enlarged. There is fusiform dilation of the ascending  aorta measuring 4.6 cm. Heavy atherosclerotic disease and tortuosity of the aorta is noted. There is a saccular aneurysm of the mid descending thoracic aorta measuring 4.3 cm in maximum transverse diameter. Lungs/Pleura: No pulmonary mass, infiltrate, or effusion. Emphysematous changes of  the lungs are noted. Musculoskeletal: No chest wall mass or suspicious bone lesions identified. There is a severely comminuted fracture of the left humeral head and neck with inferior/anterior dislocation of the humeral head in relation to the glenoid. CT ABDOMEN PELVIS FINDINGS Hepatobiliary: No masses or other significant abnormality. Pancreas: No mass, inflammatory changes, or other significant abnormality. Spleen: Within normal limits in size and appearance. Adrenals/Urinary Tract: No masses identified. No evidence of hydronephrosis. 2.2 cm right inferior pole renal cyst is seen. Bilateral, left worse than right renal cortical thinning is seen. Stomach/Bowel: No evidence of obstruction, inflammatory process, or abnormal fluid collections. Vascular/Lymphatic: No pathologically enlarged lymph nodes. There is an abdominal aortic aneurysm at the level of the takeoff of the renal arteries which measures 4.6 cm in maximum transverse diameter. The aneurysmal sac measures approximately 5.5 cm in anterior to posterior dimension. There is a eccentric thrombus within the aneurysmal sac along the right aortic wall. Celiac trunk, superior mesenteric artery, bilateral renal arteries are opacified. Bilateral renal arteries arise from the aneurysmal portion of the aorta. Soft tissue thickening is noted along the aortic wall inferior to the aneurysmal dilation, which may represent a noncalcified plaque or intramural thrombus. 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 A small area of dissection is also seen within the aneurysmal portion of the superior mesenteric artery, best  seen on image 52, coronal images. Reproductive: The urinary bladder is distended. The prostate gland is enlarged and lobular measuring up to 5.4 cm, impressing on the posterior wall of the urinary bladder. Other: None. Musculoskeletal:  No suspicious bone lesions identified. IMPRESSION: Fusiform dilation of the ascending aorta measuring 4.6 cm in maximum diameter. 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 Abdominal aortic aneurysm measuring 4.6 cm in maximum transverse diameter at the level of the renal arteries. Large intramural thrombus is seen within the aneurysmal sac. Thickening of the abdominal aortic wall inferior to the aneurysmal dilation, which may represent soft atherosclerotic plaque. Posttraumatic changes are not excluded although felt less likely in the absence of other signs of intra-abdominal injury. 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 Background of heavy atherosclerotic disease. Enlarged globular prostate gland, impressing on the posterior wall of the urinary bladder, which is distended. Bilateral renal cortical thinning. Comminuted fracture dislocation of the left humerus. These results were called by telephone at the time of interpretation on 04/02/2015 at 11:03 pm to Dr. Deno Etienne , who verbally acknowledged these results. Electronically Signed   By: Fidela Salisbury M.D.   On: 04/06/2015 23:05   Dg Pelvis Portable  03/27/2015  CLINICAL DATA:  Left-sided pain after falling EXAM: PORTABLE PELVIS 1-2 VIEWS COMPARISON:  None. FINDINGS: There is no evidence of pelvic fracture or diastasis. No pelvic bone lesions are seen. Portions of the upper pubic rami and lower sacrum are obscured by CT contrast in the urinary bladder. IMPRESSION: Negative. Electronically Signed   By: Andreas Newport M.D.   On: 03/27/2015 01:46   Dg Chest Port 1 View  03/27/2015  CLINICAL DATA:   Respiratory failure.  Endotracheal intubation. EXAM: PORTABLE CHEST 1 VIEW COMPARISON:  03/27/2015 at 01:29 FINDINGS: Endotracheal tube is 4.4 cm above the carina. There is a right subclavian central line extending into the low SVC. No pneumothorax. Lungs are clear. No large effusions. IMPRESSION: Support equipment appears satisfactorily positioned. New right subclavian central  line. No pneumothorax. Electronically Signed   By: Andreas Newport M.D.   On: 03/27/2015 04:08   Dg Chest Port 1 View  03/27/2015  CLINICAL DATA:  81 year old male with cough and fever status post intubation. EXAM: PORTABLE CHEST 1 VIEW COMPARISON:  Chest CT and radiograph dated 03/27/2015 FINDINGS: There has been interval placement of an endotracheal tube with tip approximately 7.5 cm above the carina. The lungs are clear. No pleural effusion or pneumothorax. Top-normal cardiac size. There is tortuosity of the aorta with prominence of the aortic arch corresponding to the aneurysm seen in the mid descending thoracic aorta. The osseous structures are grossly unremarkable. IMPRESSION: Endotracheal tube above the carina. Electronically Signed   By: Anner Crete M.D.   On: 03/27/2015 01:48   Dg Abd Portable 1v  03/27/2015  CLINICAL DATA:  Orogastric tube placement EXAM: PORTABLE ABDOMEN - 1 VIEW COMPARISON:  None. FINDINGS: The orogastric tube extends into the stomach with tip car rolled up into the fundus. Visible portions of the abdominal gas pattern are unremarkable. IMPRESSION: Orogastric tube extends into the stomach. Electronically Signed   By: Andreas Newport M.D.   On: 03/27/2015 04:10   Dg Humerus Left  04/18/2015  CLINICAL DATA:  Left shoulder and upper arm pain and deformity after falling down steps today. EXAM: LEFT HUMERUS - 2+ VIEW COMPARISON:  Portable chest obtained today. Left shoulder radiograph obtained today. FINDINGS: Again demonstrated is a comminuted fracture of the left humeral head and neck with inferior  dislocation of the humeral head relative to the glenoid. Associated soft tissue swelling. No other fractures or dislocations. IMPRESSION: Comminuted fracture of the left humeral head and neck with inferior dislocation of the humeral head relative to the glenoid. Electronically Signed   By: Claudie Revering M.D.   On: 03/29/2015 22:01    Anti-infectives: Anti-infectives    None      Assessment/Plan:  Intracranial hemorrhage status post fall - poor prognosis; not operable Fracture proximal left humerus with dislocation Respiratory failure Orthopedics consultation - proximal left humerus fracture  Situation discussed with family. Prognosis poor given severity of head injury for survival.  At this point, the spouse still wants to keep the patient full code  LOS: 0 days    [name] K. 03/27/2015

## 2015-03-27 NOTE — Progress Notes (Signed)
Events noted - left shoulder fracture dislocation secondary issue at this time Will follow

## 2015-03-27 NOTE — Progress Notes (Signed)
   03/27/15 0100  Clinical Encounter Type  Visited With Family  Visit Type ED  Referral From Nurse  Spiritual Encounters  Spiritual Needs Emotional;Grief support  Stress Factors  Family Stress Factors Family relationships;Health changes;Major life changes  Chaplain responded to ED A12 for patient who had taken serious turn for worse. As chaplain arrived, patient being taken to [facility]; chaplain accompanied son and wife to 3rd floor waiting room, stayed with wife as son made calls and continued to update them on when they might see patient.

## 2015-03-27 NOTE — ED Notes (Signed)
MD at bedside to prepare for intubation

## 2015-03-27 NOTE — Procedures (Signed)
Intubation Procedure Note [name] [id number] [birth year]  Procedure: Intubation Indications: Airway protection and maintenance  Procedure Details Consent: Risks of procedure as well as the alternatives and risks of each were explained to the (patient/caregiver).  Consent for procedure obtained. Time Out: Verified patient identification, verified procedure, site/side was marked, verified correct patient position, special equipment/implants available, medications/allergies/relevent history reviewed, required imaging and test results available.  Performed  Maximum sterile technique was used including antiseptics, gloves, hand hygiene and mask.  MAC and 3    Evaluation Hemodynamic Status: BP stable throughout; O2 sats: stable throughout Patient's Current Condition: stable Complications: No apparent complications Patient did tolerate procedure well. Chest X-ray ordered to verify placement.  CXR: pending.   Jori Moll 03/27/2015   Assisted with intubation of this patient at bedside, patient's vitals remained stable throughout and tube placement was visualized with use of the Glidescope, and confirmation of color change on the eCO2 detector. RT on neuro to continue to monitor.

## 2015-03-27 NOTE — Procedures (Signed)
CENTRAL VENOUS CATHETER INSERTION   Indication: hypertonic saline Consent: yes Time out: yes Appropriate position: yes Hand washing: yes Patient Sterilized and Draped: yes Location: Right sub-clavian (Cervical colar in place and concern for ICH) # of Attempts: 2 Ultrasound Guidance: no Wire Confirmed with [place]: no Insertion depth: 20 cm All Ports Draw and flush: yes CXR:   Pneumothorax: no  Line position appropriate: yes Line sutured in place: yes EBL: 10 cc Complications: no Patient Tolerated Procedure Well: yes     Meribeth Mattes, DO., MS [facility]

## 2015-03-27 NOTE — Consult Note (Signed)
CC:  Chief Complaint  Patient presents with  . Arm Injury    HPI: [name] is a 81 y.o. 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  He did not lose consciousness. He was GCS 15 on arrival. He developed progressively depressed consciousness as well as right hemiplegia. Interval CT scan position worsening left frontoparietal intraparenchymal hemorrhage. He takes aspirin.  PMH: Past Medical History  Diagnosis Date  . Kidney tumor (benign)   . Chronic headache   . Hypertension   . Nephrolithiasis   . Renal insufficiency   . Peripheral vascular disease ([place])   . BPH (benign prostatic hypertrophy)   . CAD (coronary artery disease)   . Detached retina   . Asthma   . Allergic rhinitis   . Sleep apnea   . Pulmonary fibrosis [facility]) Jan 2009    very subtle possible fibrosis noted on CT Jan 2009.  Unchanged since Oct 2006. Deemed possibly due to chlorine exposure at gym work swimming pool  . COPD (chronic obstructive pulmonary disease) ([place])     2001 PFTs  - FEv1 2.5L/66%, DLCO 57% -> March 2009: Fev1 1.91L/625, Ratio 50, DLCO 50%  . AAA (abdominal aortic aneurysm) ([facility])     Rupture 16 years ago  . History of colonic diverticulitis   . Aneurysm, common iliac artery ([place])   . Myocardial infarction ([place])     minor in 2000, 2001  . Aortic insufficiency   . Chronic systolic CHF (congestive heart failure) ([place])   . Cholelithiasis 09/14/2012  . Pulmonary nodule, left 10/27/2010    #Pulmonary nodule Left Lower Lobe  - CT 2009 present  - CXR 2012 - not present  - Patient prefers clinical followup after weighing options    . NEPHROLITHIASIS, HX OF 10/08/2006    Qualifier: Diagnosis of  By: Leanne Chang MD, Bruce    . PULMONARY HYPERTENSION 06/02/2007    Qualifier: Diagnosis of  By: Chase Caller MD, Murali  Echo 07/31/10: no mention of pulmonary htn  Tricuspid valve: Structurally normal valve. Leaflet separation was normal. Doppler: Transvalvular velocity was within the normal range. Mild  regurgitation.     . TESTOSTERONE DEFICIENCY 02/11/2007    Qualifier: Diagnosis of  By: Leanne Chang MD, Bruce    . HLD (hyperlipidemia) 01/22/2013    PSH: Past Surgical History  Procedure Laterality Date  . Nephrectomy  2003     left partial, benign tumor  . Vasectomy    . Ptca  2001 and 2002  . Abdominal aortic aneurysm repair  1997  . Cataract extraction      bilateral  . Cardiac catheterization  07/22/02    EF 55%  . Abdominal aortic aneurysm repair    . Eye surgery    . Esophagogastroduodenoscopy N/A 09/15/2012    Procedure: ESOPHAGOGASTRODUODENOSCOPY (EGD);  Surgeon: Lafayette Dragon, MD;  Location[facility] ENDOSCOPY;  Service: Endoscopy;  Laterality: N/A;  . Esophagogastroduodenoscopy N/A 09/09/2013    Procedure: ESOPHAGOGASTRODUODENOSCOPY (EGD);  Surgeon: Inda Castle, MD;  Location: [facility] ENDOSCOPY;  Service: Endoscopy;  Laterality: N/A;  talk to robin ,had no opening for mac at this time could not put a mac case in dr. Paulita Fujita day .Shirlean Mylar said to put it as moderate sed.  jacqueline aiken,if something come available will contac robin  . Savory dilation N/A 09/09/2013    Procedure: SAVORY DILATION;  Surgeon: Inda Castle, MD;  Location: [facility] ENDOSCOPY;  Service: Endoscopy;  Laterality: N/A;  . Cardiac catheterization N/A 02/04/2015  Procedure: Left Heart Cath and Coronary Angiography;  Surgeon: Peter M Martinique, MD;  Location: [facility];  Service: Cardiovascular;  Laterality: N/A;    SH: Social History  Substance Use Topics  . Smoking status: Former Smoker -- 1.00 packs/day for 50 years    Types: Cigarettes    Quit date: 04/23/1997  . Smokeless tobacco: Never Used  . Alcohol Use: No    MEDS: Prior to Admission medications   Medication Sig Start Date End Date Taking? Authorizing Provider  acetaminophen (TYLENOL) 325 MG tablet Take 650 mg by mouth every 6 (six) hours as needed (pain).    Yes Historical Provider, MD  albuterol (PROVENTIL HFA;VENTOLIN HFA) 108 (90 BASE) MCG/ACT  inhaler Inhale 2 puffs into the lungs every 6 (six) hours as needed for wheezing or shortness of breath. 02/25/15  Yes Brand Males, MD  aspirin EC 81 MG tablet Take 81 mg by mouth at bedtime.    Yes Historical Provider, MD  beta carotene w/minerals (OCUVITE) tablet Take 1 tablet by mouth daily.   Yes Historical Provider, MD  finasteride (PROSCAR) 5 MG tablet Take 1 tablet (5 mg total) by mouth daily. Patient taking differently: Take 5 mg by mouth every evening.  03/22/15  Yes Marin Olp, MD  Garlic 2 MG CAPS Take 1 capsule by mouth daily.   Yes Historical Provider, MD  ibuprofen (ADVIL,MOTRIN) 800 MG tablet TAKE 1 TABLET BY MOUTH EVERY 8 HOURS AS NEEDED FOR MIGRAINE HEADACHES 01/31/15  Yes Marin Olp, MD  lisinopril (PRINIVIL,ZESTRIL) 40 MG tablet TAKE 1 TABLET EVERY DAY  ( APPOINTMENT IS NEEDED  FOR  FURTHER  REFILLS ) 11/12/14  Yes Peter M Martinique, MD  LORazepam (ATIVAN) 1 MG tablet TAKE 1 TABLET EVERY DAY AS NEEDED FOR ANXIETY 03/01/15  Yes Marin Olp, MD  Magnesium 100 MG CAPS Take 1 capsule by mouth daily.   Yes Historical Provider, MD  Multiple Vitamin (MULTIVITAMIN) capsule Take 1 capsule by mouth daily.   Yes Historical Provider, MD  nitroGLYCERIN (NITROSTAT) 0.4 MG SL tablet Place 1 tablet (0.4 mg total) under the tongue every 5 (five) minutes as needed for chest pain. 11/08/14  Yes Peter M Martinique, MD  omeprazole (PRILOSEC) 20 MG capsule TAKE 1 CAPSULE TWICE A DAY 10/04/14  Yes Inda Castle, MD  tiotropium (SPIRIVA) 18 MCG inhalation capsule Place 18 mcg into inhaler and inhale daily.   Yes Historical Provider, MD  zolpidem (AMBIEN) 5 MG tablet TAKE 1 TABLET AT BEDTIME AS NEEDED Patient taking differently: TAKE 1 TABLET AT BEDTIME AS NEEDED FOR SLEEP 02/01/15  Yes Marin Olp, MD  orphenadrine (NORFLEX) 100 MG tablet Take 1 tablet (100 mg total) by mouth 2 (two) times daily as needed for muscle spasms. 10/27/14   Marin Olp, MD    ALLERGY: Allergies   Allergen Reactions  . Beta Adrenergic Blockers Shortness Of Breath and Other (See Comments)    Difficulty breathing  . Statins Other (See Comments)    Crestor, lipitor-UNKNOWN REACTION    ROS: ROS  NEUROLOGIC EXAM: Intubated and sedated Pupils small, equal, reactive to light sluggish Withdraws on left, plegic on right  IMAGING: CT Head: Left frontoparietal intraparenchymal hemorrhage with diffuse patchy subarachnoid hemorrhage.  There has been interval progression of the intraparenchymal hemorrhage on each of the subsequent studies. CT Cervical Spine: No fractures or subluxation  IMPRESSION: - 81 y.o. male with progressive left frontoparietal intraparenchymal hemorrhage after a fall.  No role for surgical  intervention.  I have discussed with the family that I expect him to have a poor prognosis despite maximum medical therapy.  PLAN: - Continue supportive care.

## 2015-03-27 NOTE — Progress Notes (Signed)
eLink Physician-Brief Progress Note Patient Name: [name] DOB: [birth year] MRN: [id number]   Date of Service  03/27/2015  HPI/Events of Note  Na still in 140s on 3% Saline  eICU Interventions  Increase rate to 75cc/hr     Intervention Category Intermediate Interventions: Other:  Acey Woodfield 03/27/2015, 4:34 PM

## 2015-03-27 NOTE — Progress Notes (Signed)
Orthopedic Tech Progress Note Patient Details:  [name] Jun 05, 1930 [id number]  Ortho Devices Type of Ortho Device: Arm sling Ortho Device/Splint Interventions: Application   [place] Pink 03/27/2015, 9:47 AM

## 2015-03-27 NOTE — ED Notes (Signed)
Intubation attempt x1

## 2015-03-27 NOTE — H&P (Signed)
PULMONARY / CRITICAL CARE MEDICINE   Name: [name] MRN: [id number] DOB: 1930/11/04    ADMISSION DATE:  04/08/2015  CHIEF COMPLAINT:  IPH/SAH  HISTORY OF PRESENT ILLNESS:   Patient presents to emergency room after falling from level round. He had gotten out of his car after a long trip is walking toward his house when he lost his balance and fell backwards striking his left side and left head. He was brought to the emergency room as a non-activation. Workup included a head CT which showed a large intracerebral hemorrhage and a fracture dislocation of his left proximal humerus. His mental status declined and he was intubated in the emergency room. Upon examination he was obtunded with a Glasgow Coma Scale of 3 prior to intubation. EDP had call neurosurgery and orthopedics. I discussed history with the family who provided this information.   PAST MEDICAL HISTORY :   has a past medical history of Kidney tumor (benign); Chronic headache; Hypertension; Nephrolithiasis; Renal insufficiency; Peripheral vascular disease ([place]); BPH (benign prostatic hypertrophy); CAD (coronary artery disease); Detached retina; Asthma; Allergic rhinitis; Sleep apnea; Pulmonary fibrosis [facility]) (Jan 2009); COPD (chronic obstructive pulmonary disease) ([place]); AAA (abdominal aortic aneurysm) ([place]); History of colonic diverticulitis; Aneurysm, common iliac artery ([place]); Myocardial infarction ([place]); Aortic insufficiency; Chronic systolic CHF (congestive heart failure) ([place]); Cholelithiasis (09/14/2012); Pulmonary nodule, left (10/27/2010); NEPHROLITHIASIS, HX OF (10/08/2006); PULMONARY HYPERTENSION (06/02/2007); TESTOSTERONE DEFICIENCY (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  has past surgical history that includes Nephrectomy (2003); Vasectomy; Mitral valve replacement (2001 and 2002); Abdominal aortic aneurysm repair (1997); Cataract extraction; Cardiac catheterization (07/22/02); Abdominal aortic aneurysm repair; Eye  surgery; Esophagogastroduodenoscopy (N/A, 09/15/2012); Esophagogastroduodenoscopy (N/A, 09/09/2013); Savory dilation (N/A, 09/09/2013); and Cardiac catheterization (N/A, 02/04/2015). Prior to Admission medications   Medication Sig Start Date End Date Taking? Authorizing Provider  acetaminophen (TYLENOL) 325 MG tablet Take 650 mg by mouth every 6 (six) hours as needed (pain).    Yes Historical Provider, MD  albuterol (PROVENTIL HFA;VENTOLIN HFA) 108 (90 BASE) MCG/ACT inhaler Inhale 2 puffs into the lungs every 6 (six) hours as needed for wheezing or shortness of breath. 02/25/15  Yes Brand Males, MD  aspirin EC 81 MG tablet Take 81 mg by mouth at bedtime.    Yes Historical Provider, MD  beta carotene w/minerals (OCUVITE) tablet Take 1 tablet by mouth daily.   Yes Historical Provider, MD  finasteride (PROSCAR) 5 MG tablet Take 1 tablet (5 mg total) by mouth daily. Patient taking differently: Take 5 mg by mouth every evening.  03/22/15  Yes Marin Olp, MD  Garlic 2 MG CAPS Take 1 capsule by mouth daily.   Yes Historical Provider, MD  ibuprofen (ADVIL,MOTRIN) 800 MG tablet TAKE 1 TABLET BY MOUTH EVERY 8 HOURS AS NEEDED FOR MIGRAINE HEADACHES 01/31/15  Yes Marin Olp, MD  lisinopril (PRINIVIL,ZESTRIL) 40 MG tablet TAKE 1 TABLET EVERY DAY  ( APPOINTMENT IS NEEDED  FOR  FURTHER  REFILLS ) 11/12/14  Yes Peter M Martinique, MD  LORazepam (ATIVAN) 1 MG tablet TAKE 1 TABLET EVERY DAY AS NEEDED FOR ANXIETY 03/01/15  Yes Marin Olp, MD  Magnesium 100 MG CAPS Take 1 capsule by mouth daily.   Yes Historical Provider, MD  Multiple Vitamin (MULTIVITAMIN) capsule Take 1 capsule by mouth daily.   Yes Historical Provider, MD  nitroGLYCERIN (NITROSTAT) 0.4 MG SL tablet Place 1 tablet (0.4 mg total) under the tongue every 5 (five) minutes as needed for chest pain. 11/08/14  Yes Peter M Martinique,  MD  omeprazole (PRILOSEC) 20 MG capsule TAKE 1 CAPSULE TWICE A DAY 10/04/14  Yes Inda Castle, MD  tiotropium  (SPIRIVA) 18 MCG inhalation capsule Place 18 mcg into inhaler and inhale daily.   Yes Historical Provider, MD  zolpidem (AMBIEN) 5 MG tablet TAKE 1 TABLET AT BEDTIME AS NEEDED Patient taking differently: TAKE 1 TABLET AT BEDTIME AS NEEDED FOR SLEEP 02/01/15  Yes Marin Olp, MD  orphenadrine (NORFLEX) 100 MG tablet Take 1 tablet (100 mg total) by mouth 2 (two) times daily as needed for muscle spasms. 10/27/14   Marin Olp, MD   Allergies  Allergen Reactions  . Beta Adrenergic Blockers Shortness Of Breath and Other (See Comments)    Difficulty breathing  . Statins Other (See Comments)    Crestor, lipitor-UNKNOWN REACTION    FAMILY HISTORY:  indicated that his mother is deceased. He indicated that his father is deceased. He indicated that his maternal grandmother is deceased. He indicated that his maternal grandfather is deceased. He indicated that his paternal grandmother is deceased. He indicated that his paternal grandfather is deceased.  SOCIAL HISTORY:  reports that he quit smoking about 17 years ago. His smoking use included Cigarettes. He has a 50 pack-year smoking history. He has never used smokeless tobacco. He reports that he does not drink alcohol or use illicit drugs.  REVIEW OF SYSTEMS:   Per family: Negative for:  Slurred speech, chest pain, SOB, stumbling. Positive for: Headhaches, dizziness.  SUBJECTIVE:   VITAL SIGNS: Temp:  [97.4 F (36.3 C)] 97.4 F (36.3 C) (12/03 2032) Pulse Rate:  [75-109] 75 (12/04 0130) Resp:  [17-33] 19 (12/04 0130) BP: (99-156)/(56-113) 99/65 mmHg (12/04 0130) SpO2:  [89 %-98 %] 98 % (12/04 0130) FiO2 (%):  [60 %] 60 % (12/04 0120) Weight:  [90.3 kg (199 lb 1.2 oz)] 90.3 kg (199 lb 1.2 oz) (12/04 0046) HEMODYNAMICS:   VENTILATOR SETTINGS: Vent Mode:  [-] PRVC FiO2 (%):  [60 %] 60 % Set Rate:  [14 bmp] 14 bmp Vt Set:  [640 mL] 640 mL PEEP:  [5 cmH20] 5 cmH20 INTAKE / OUTPUT: No intake or output data in the 24 hours ending  03/27/15 0145  PHYSICAL EXAMINATION: General:  NAD Neuro:  Moves Left UE and LE to voice and spontaneously.  Grimaces to pain LUE and RUE.  No response to pain right side.  Pupils 46mm and reactive to light b/l.  Leftward gaze preference  HEENT:  ETT in place Cardiovascular:  RRR, s1/s2 no m/r/g Lungs:  CTA b/L, mechanical breath sounds, no w/r/r Abdomen:  Soft non tender, non distended, normal BS Musculoskeletal:  Left shoulder deformity, dislocated and + effusion.  Left hip deformity noted. Skin:  Multiple ecchymosis L>>R extremities  LABS:  CBC  Recent Labs Lab 04/05/2015 2242  WBC 17.7*  HGB 14.9  HCT 43.9  PLT 111*   Coag's  Recent Labs Lab 04/12/2015 2242  APTT 30  INR 1.21   BMET  Recent Labs Lab 04/10/2015 2242  NA 140  K 4.4  CL 110  CO2 23  BUN 18  CREATININE 1.19  GLUCOSE 161*   Electrolytes  Recent Labs Lab 03/27/2015 2242  CALCIUM 8.7*   Sepsis Markers No results for input(s): LATICACIDVEN, PROCALCITON, O2SATVEN in the last 168 hours. ABG No results for input(s): PHART, PCO2ART, PO2ART in the last 168 hours. Liver Enzymes  Recent Labs Lab 04/09/2015 2242  AST 21  ALT 15*  ALKPHOS 61  BILITOT 0.9  ALBUMIN 3.2*   Cardiac Enzymes No results for input(s): TROPONINI, PROBNP in the last 168 hours. Glucose No results for input(s): GLUCAP in the last 168 hours.  Imaging Dg Chest 1 View  03/31/2015  CLINICAL DATA:  Left rib pain after falling down steps today. Left shoulder and upper arm pain and deformity. EXAM: CHEST 1 VIEW COMPARISON:  02/03/2015 FINDINGS: Enlarged cardiac silhouette with a mild increase in size. Diffusely enlarged and tortuous thoracic aorta without significant change. Clear lungs. Interval mildly comminuted left humeral head and neck fracture with anterior dislocation of the humeral head relative to the glenoid. Mild right shoulder degenerative changes. No visible rib fracture or pneumothorax. IMPRESSION: 1. Comminuted left  humeral head and neck fracture with dislocation of the humeral head. 2. Progressive cardiomegaly. Electronically Signed   By: Claudie Revering M.D.   On: 04/14/2015 22:00   Dg Shoulder 1v Left  04/07/2015  CLINICAL DATA:  [place] down 7 steps today. EXAM: LEFT SHOULDER - 1 VIEW COMPARISON:  None. FINDINGS: There are significant study limitations due to positioning challenges. There appears to be a fracture dislocation of the left shoulder. The humeral head is inferior to the glenoid and there appears to be foreshortening about a comminuted proximal humeral fracture. IMPRESSION: Fracture dislocation about the left shoulder. Electronically Signed   By: Andreas Newport M.D.   On: 04/08/2015 22:07   Dg Elbow 2 Views Left  03/28/2015  CLINICAL DATA:  Left shoulder and proximal humerus pain and deformity following a fall down steps today. EXAM: LEFT ELBOW - 2 VIEW COMPARISON:  Left humerus radiographs obtained today. FINDINGS: The patient was unable to cooperate for an adequate lateral view due to the previously described proximal fracture/dislocation of the humerus. The elbow has a grossly normal appearance with the exception of mild coronoid process spur formation. No visible fracture, dislocation or effusion. IMPRESSION: Somewhat limited examination with no gross fracture or effusion. Electronically Signed   By: Claudie Revering M.D.   On: 03/27/2015 22:03   Ct Head Wo Contrast  03/27/2015  CLINICAL DATA:  Intracranial hemorrhage.  Increasing lethargy. EXAM: CT HEAD WITHOUT CONTRAST TECHNIQUE: Contiguous axial images were obtained from the base of the skull through the vertex without intravenous contrast. COMPARISON:  04/04/2015 at 21:50 FINDINGS: The intraparenchymal left frontoparietal convexity hemorrhage has enlarged. It currently measures 3.8 x 6.4 by 5 cm and previously measured 2.8 x 4.4 x 3 cm. There is increasing mass effect on the left lateral ventricular system and increasing sulcal effacement there now is 6  mm left-to-right midline shift. There also is increasing volume of subarachnoid hemorrhage. No intraventricular extension. Basal cisterns remain patent. IMPRESSION: Significant enlargement of the intraparenchymal hemorrhage. Increasing subarachnoid hemorrhage. Worsening mass effect and midline shift. Critical Value/emergent results were called by telephone at the time of interpretation on 03/27/2015 at 12:35 am to Dr. Venora Maples, who verbally acknowledged these results. Electronically Signed   By: Andreas Newport M.D.   On: 03/27/2015 00:35   Ct Head Wo Contrast  04/07/2015  CLINICAL DATA:  82 year old male with fall and trauma to the back of the head EXAM: CT HEAD WITHOUT CONTRAST CT CERVICAL SPINE WITHOUT CONTRAST TECHNIQUE: Multidetector CT imaging of the head and cervical spine was performed following the standard protocol without intravenous contrast. Multiplanar CT image reconstructions of the cervical spine were also generated. COMPARISON:  The the head CT dated 02/09/2002 and MRI dated 05/26/2009 FINDINGS: CT HEAD FINDINGS There is a 2.8 x 4.4 cm intraparenchymal hemorrhage centered  in the left frontoparietal convexity and centrum semiovale. There is mild white matter edema in the surrounding brain parenchyma with mild mass effect and effacement of the sulci. Small bilateral parietal lobe subarachnoid hemorrhages noted. Small scattered bilateral cortical hyperdensity involving the right occipital lobe as well as bilateral frontal lobes may represent cortical contusions versus small subarachnoid hemorrhages. No intraventricular hemorrhage identified. There is no midline shift. There is slight prominence of the ventricles and sulci compatible with age-related volume loss. Mild periventricular and deep white matter hypodensities represent chronic microvascular ischemic changes. The visualized paranasal sinuses and mastoid air cells are well aerated. The calvarium is intact. CT CERVICAL SPINE FINDINGS There is  no acute fracture or subluxation of the cervical spine.There is osteopenia with multilevel degenerative changes. There is mild loss of C6 vertebral body height, likely chronic.The odontoid and spinous processes are intact.There is normal anatomic alignment of the C1-C2 lateral masses. The visualized soft tissues appear unremarkable. IMPRESSION: Intraparenchymal hemorrhage involving the left frontoparietal convexity with surrounding edema and mild mass effect. No midline shift. Small bilateral parietal subarachnoid hemorrhages. Small scattered cortical contusions predominantly involving the right parietal and bilateral frontal lobes. No intraventricular hemorrhage. No acute/traumatic cervical spine pathology. Critical Value/emergent results were called by telephone at the time of interpretation on 03/28/2015 at 10:24 pm to Dr. Deno Etienne , who verbally acknowledged these results. Electronically Signed   By: Anner Crete M.D.   On: 04/02/2015 22:26   Ct Chest W Contrast  04/15/2015  CLINICAL DATA:  Status post fall with left-sided pain. EXAM: CT CHEST WITH CONTRAST CT ABDOMEN AND PELVIS WITH CONTRAST TECHNIQUE: Multidetector CT imaging of the chest was performed during intravenous contrast administration. Multidetector CT imaging of the abdomen and pelvis was performed following the standard protocol after bolus administration of intravenous contrast. CONTRAST:  72mL OMNIPAQUE IOHEXOL 300 MG/ML  SOLN COMPARISON:  Chest radiograph 04/08/2015 FINDINGS: CT CHEST FINDINGS Mediastinum/Lymph Nodes: No masses, pathologically enlarged lymph nodes. The heart is enlarged. There is fusiform dilation of the ascending aorta measuring 4.6 cm. Heavy atherosclerotic disease and tortuosity of the aorta is noted. There is a saccular aneurysm of the mid descending thoracic aorta measuring 4.3 cm in maximum transverse diameter. Lungs/Pleura: No pulmonary mass, infiltrate, or effusion. Emphysematous changes of the lungs are noted.  Musculoskeletal: No chest wall mass or suspicious bone lesions identified. There is a severely comminuted fracture of the left humeral head and neck with inferior/anterior dislocation of the humeral head in relation to the glenoid. CT ABDOMEN PELVIS FINDINGS Hepatobiliary: No masses or other significant abnormality. Pancreas: No mass, inflammatory changes, or other significant abnormality. Spleen: Within normal limits in size and appearance. Adrenals/Urinary Tract: No masses identified. No evidence of hydronephrosis. 2.2 cm right inferior pole renal cyst is seen. Bilateral, left worse than right renal cortical thinning is seen. Stomach/Bowel: No evidence of obstruction, inflammatory process, or abnormal fluid collections. Vascular/Lymphatic: No pathologically enlarged lymph nodes. There is an abdominal aortic aneurysm at the level of the takeoff of the renal arteries which measures 4.6 cm in maximum transverse diameter. The aneurysmal sac measures approximately 5.5 cm in anterior to posterior dimension. There is a eccentric thrombus within the aneurysmal sac along the right aortic wall. Celiac trunk, superior mesenteric artery, bilateral renal arteries are opacified. Bilateral renal arteries arise from the aneurysmal portion of the aorta. Soft tissue thickening is noted along the aortic wall inferior to the aneurysmal dilation, which may represent a noncalcified plaque or intramural thrombus. There is also an  aneurysmal dilation of the proximal portion of the superior mesenteric artery measuring 1.3 cm. A small area of dissection is also seen within the aneurysmal portion of the superior mesenteric artery, best seen on image 52, coronal images. Reproductive: The urinary bladder is distended. The prostate gland is enlarged and lobular measuring up to 5.4 cm, impressing on the posterior wall of the urinary bladder. Other: None. Musculoskeletal:  No suspicious bone lesions identified. IMPRESSION: Fusiform dilation of  the ascending aorta measuring 4.6 cm in maximum diameter. 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 Abdominal aortic aneurysm measuring 4.6 cm in maximum transverse diameter at the level of the renal arteries. Large intramural thrombus is seen within the aneurysmal sac. Thickening of the abdominal aortic wall inferior to the aneurysmal dilation, which may represent soft atherosclerotic plaque. Posttraumatic changes are not excluded although felt less likely in the absence of other signs of intra-abdominal injury. 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 Background of heavy atherosclerotic disease. Enlarged globular prostate gland, impressing on the posterior wall of the urinary bladder, which is distended. Bilateral renal cortical thinning. Comminuted fracture dislocation of the left humerus. These results were called by telephone at the time of interpretation on 04/01/2015 at 11:03 pm to Dr. Deno Etienne , who verbally acknowledged these results. Electronically Signed   By: Fidela Salisbury M.D.   On: 03/27/2015 23:05   Ct Cervical Spine Wo Contrast  03/24/2015  CLINICAL DATA:  81 year old male with fall and trauma to the back of the head EXAM: CT HEAD WITHOUT CONTRAST CT CERVICAL SPINE WITHOUT CONTRAST TECHNIQUE: Multidetector CT imaging of the head and cervical spine was performed following the standard protocol without intravenous contrast. Multiplanar CT image reconstructions of the cervical spine were also generated. COMPARISON:  The the head CT dated 02/09/2002 and MRI dated 05/26/2009 FINDINGS: CT HEAD FINDINGS There is a 2.8 x 4.4 cm intraparenchymal hemorrhage centered in the left frontoparietal convexity and centrum semiovale. There is mild white matter edema in the surrounding brain parenchyma with mild mass effect and effacement of the sulci. Small bilateral parietal lobe subarachnoid  hemorrhages noted. Small scattered bilateral cortical hyperdensity involving the right occipital lobe as well as bilateral frontal lobes may represent cortical contusions versus small subarachnoid hemorrhages. No intraventricular hemorrhage identified. There is no midline shift. There is slight prominence of the ventricles and sulci compatible with age-related volume loss. Mild periventricular and deep white matter hypodensities represent chronic microvascular ischemic changes. The visualized paranasal sinuses and mastoid air cells are well aerated. The calvarium is intact. CT CERVICAL SPINE FINDINGS There is no acute fracture or subluxation of the cervical spine.There is osteopenia with multilevel degenerative changes. There is mild loss of C6 vertebral body height, likely chronic.The odontoid and spinous processes are intact.There is normal anatomic alignment of the C1-C2 lateral masses. The visualized soft tissues appear unremarkable. IMPRESSION: Intraparenchymal hemorrhage involving the left frontoparietal convexity with surrounding edema and mild mass effect. No midline shift. Small bilateral parietal subarachnoid hemorrhages. Small scattered cortical contusions predominantly involving the right parietal and bilateral frontal lobes. No intraventricular hemorrhage. No acute/traumatic cervical spine pathology. Critical Value/emergent results were called by telephone at the time of interpretation on 04/05/2015 at 10:24 pm to Dr. Deno Etienne , who verbally acknowledged these results. Electronically Signed   By: Anner Crete M.D.   On: 03/25/2015 22:26   Ct Abdomen Pelvis W Contrast  04/23/2015  CLINICAL DATA:  Status post fall with left-sided pain. EXAM: CT CHEST WITH CONTRAST CT ABDOMEN AND PELVIS WITH CONTRAST TECHNIQUE: Multidetector CT imaging of the chest was performed during intravenous contrast administration. Multidetector CT imaging of the abdomen and pelvis was performed following the standard  protocol after bolus administration of intravenous contrast. CONTRAST:  37mL OMNIPAQUE IOHEXOL 300 MG/ML  SOLN COMPARISON:  Chest radiograph 04/01/2015 FINDINGS: CT CHEST FINDINGS Mediastinum/Lymph Nodes: No masses, pathologically enlarged lymph nodes. The heart is enlarged. There is fusiform dilation of the ascending aorta measuring 4.6 cm. Heavy atherosclerotic disease and tortuosity of the aorta is noted. There is a saccular aneurysm of the mid descending thoracic aorta measuring 4.3 cm in maximum transverse diameter. Lungs/Pleura: No pulmonary mass, infiltrate, or effusion. Emphysematous changes of the lungs are noted. Musculoskeletal: No chest wall mass or suspicious bone lesions identified. There is a severely comminuted fracture of the left humeral head and neck with inferior/anterior dislocation of the humeral head in relation to the glenoid. CT ABDOMEN PELVIS FINDINGS Hepatobiliary: No masses or other significant abnormality. Pancreas: No mass, inflammatory changes, or other significant abnormality. Spleen: Within normal limits in size and appearance. Adrenals/Urinary Tract: No masses identified. No evidence of hydronephrosis. 2.2 cm right inferior pole renal cyst is seen. Bilateral, left worse than right renal cortical thinning is seen. Stomach/Bowel: No evidence of obstruction, inflammatory process, or abnormal fluid collections. Vascular/Lymphatic: No pathologically enlarged lymph nodes. There is an abdominal aortic aneurysm at the level of the takeoff of the renal arteries which measures 4.6 cm in maximum transverse diameter. The aneurysmal sac measures approximately 5.5 cm in anterior to posterior dimension. There is a eccentric thrombus within the aneurysmal sac along the right aortic wall. Celiac trunk, superior mesenteric artery, bilateral renal arteries are opacified. Bilateral renal arteries arise from the aneurysmal portion of the aorta. Soft tissue thickening is noted along the aortic wall  inferior to the aneurysmal dilation, which may represent a noncalcified plaque or intramural thrombus. 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 A small area of dissection is also seen within the aneurysmal portion of the superior mesenteric artery, best seen on image 52, coronal images. Reproductive: The urinary bladder is distended. The prostate gland is enlarged and lobular measuring up to 5.4 cm, impressing on the posterior wall of the urinary bladder. Other: None. Musculoskeletal:  No suspicious bone lesions identified. IMPRESSION: Fusiform dilation of the ascending aorta measuring 4.6 cm in maximum diameter. 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 Abdominal aortic aneurysm measuring 4.6 cm in maximum transverse diameter at the level of the renal arteries. Large intramural thrombus is seen within the aneurysmal sac. Thickening of the abdominal aortic wall inferior to the aneurysmal dilation, which may represent soft atherosclerotic plaque. Posttraumatic changes are not excluded although felt less likely in the absence of other signs of intra-abdominal injury. 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 Background of heavy atherosclerotic disease. Enlarged globular prostate gland, impressing on the posterior wall of the urinary bladder, which is distended. Bilateral renal cortical thinning. Comminuted fracture dislocation of the left humerus. These results were called by telephone at the time of interpretation on 04/06/2015 at 11:03 pm to Dr. Deno Etienne , who verbally acknowledged these results. Electronically Signed   By: Fidela Salisbury M.D.   On: 04/22/2015 23:05   Dg Humerus Left  04/21/2015  CLINICAL DATA:  Left shoulder and upper arm pain and deformity after falling down steps today. EXAM: LEFT HUMERUS - 2+ VIEW  COMPARISON:  Portable chest obtained today. Left shoulder radiograph obtained today. FINDINGS: Again demonstrated is a comminuted fracture of the left humeral head and neck with inferior dislocation of the humeral head relative to the glenoid. Associated soft tissue swelling. No other fractures or dislocations. IMPRESSION: Comminuted fracture of the left humeral head and neck with inferior dislocation of the humeral head relative to the glenoid. Electronically Signed   By: Claudie Revering M.D.   On: 04/14/2015 22:01     ASSESSMENT / PLAN: 81 yo male with mechanical fall and subsequent multiple trauma including left shoulder dislocation and comminuted fracture, Left frontoparietal IPH and small SAH, both of which enlarging since admission.  PULMONARY  A: Intubated for airway protection and low GCS in ED.  Left sided pulmonary nodule and Moderately severe COPD/emphysema which is currently not exacerbated. P:    - PRVC 500/12/0.6/5  - ABG  - Advance ETT by 4cm  - Oral care  - PPI  - Duonebs Q6hours  - hold home spiriva  CARDIOVASCULAR A: BP management for SAH and IPH.  History of AAA (4.6cm), TAA (4.3cm).  CT scan with dilation of prox SMA. P:   - SBP goal <140  - PRN labetalol push for SBP >140  RENAL A:  Normal renal function currently P:    - Foley cath to gravity  - serum Sodium Q6hrs  GASTROINTESTINAL P:    - PPI  - OGT in place  - Tube feeds to start tomorrow if patient improves.  - speech and swallow eval if patient is extubated prior to eating  HEMATOLOGIC A:  Leukocytosis likely reactive P:   - monitor  MUSCULOSKELETAL/TRAUMA: A/P: Left shoulder comminuted fracture and dislocation, currently unable to clear Cspine:  - Sling  - further management per ortho service  - Rigid neck collar  - appreciate trauma and ortho recs.   NEUROLOGIC A:  Acute traumatic IPH and SAH now enlarging with e/o midline shift.  His exam was originally catastrophic but is now moving left  side and localizing to pain on that side. P:   RASS goal: 0  - Not a surgical candidate per Neurosurg  - Hypertonic Saline initiation @ 50cc Per protocol. Goal Serum sodium150-155  - Check Sosm now  - Q6h sodium  - CVC inserted for 3% saline  - BP goals as above  - Keppra 1g IV x1 then 500mg  IV BID  - Q1h neurochecks  - INR ok  - fentanyl intermittent for sedation  - neurology consult   FAMILY  - Updates:  At bedside today.  Explained the overall bleak prognosis but that currently he was following commands. He is full code for now.      Total critical care time: 60 min  Critical care time was exclusive of separately billable procedures and treating other patients.  Critical care was necessary to treat or prevent imminent or life-threatening deterioration.  Critical care was time spent personally by me on the following activities: development of treatment plan with patient and/or surrogate as well as nursing, discussions with consultants, evaluation of patient's response to treatment, examination of patient, obtaining history from patient or surrogate, ordering and performing treatments and interventions, ordering and review of laboratory studies, ordering and review of radiographic studies, pulse oximetry and re-evaluation of patient's condition.   Meribeth Mattes, DO., MS [facility]  Pulmonary and [place] Pager: [phone]  03/27/2015, 1:45 AM

## 2015-03-28 LAB — SODIUM
SODIUM: 153 mmol/L — AB (ref 135–145)
SODIUM: 158 mmol/L — AB (ref 135–145)

## 2015-03-28 LAB — BASIC METABOLIC PANEL
Anion gap: 4 — ABNORMAL LOW (ref 5–15)
BUN: 32 mg/dL — ABNORMAL HIGH (ref 6–20)
CHLORIDE: 130 mmol/L — AB (ref 101–111)
CO2: 24 mmol/L (ref 22–32)
CREATININE: 1.52 mg/dL — AB (ref 0.61–1.24)
Calcium: 8.8 mg/dL — ABNORMAL LOW (ref 8.9–10.3)
GFR calc non Af Amer: 41 mL/min — ABNORMAL LOW (ref >60)
GFR, EST AFRICAN AMERICAN: 47 mL/min — AB (ref >60)
Glucose, Bld: 160 mg/dL — ABNORMAL HIGH (ref 65–99)
POTASSIUM: 4.5 mmol/L (ref 3.5–5.1)
Sodium: 158 mmol/L — ABNORMAL HIGH (ref 135–145)

## 2015-03-28 LAB — CBC
HEMATOCRIT: 36.3 % — AB (ref 39.0–52.0)
HEMOGLOBIN: 11.4 g/dL — AB (ref 13.0–17.0)
MCH: 29.5 pg (ref 26.0–34.0)
MCHC: 31.4 g/dL (ref 30.0–36.0)
MCV: 94 fL (ref 78.0–100.0)
Platelets: DECREASED 10*3/uL (ref 150–400)
RBC: 3.86 MIL/uL — AB (ref 4.22–5.81)
RDW: 14.6 % (ref 11.5–15.5)
WBC: 16.2 10*3/uL — ABNORMAL HIGH (ref 4.0–10.5)

## 2015-03-28 LAB — GLUCOSE, CAPILLARY
GLUCOSE-CAPILLARY: 101 mg/dL — AB (ref 65–99)
GLUCOSE-CAPILLARY: 135 mg/dL — AB (ref 65–99)

## 2015-03-28 MED ORDER — IPRATROPIUM-ALBUTEROL 0.5-2.5 (3) MG/3ML IN SOLN
3.0000 mL | Freq: Three times a day (TID) | RESPIRATORY_TRACT | Status: DC
  Administered 2015-03-28 – 2015-03-29 (×5): 3 mL via RESPIRATORY_TRACT
  Filled 2015-03-28: qty 42
  Filled 2015-03-28 (×4): qty 3

## 2015-03-28 MED ORDER — PIVOT 1.5 CAL PO LIQD
1000.0000 mL | ORAL | Status: DC
  Administered 2015-03-28 – 2015-03-29 (×2): 1000 mL
  Filled 2015-03-28 (×3): qty 1000

## 2015-03-28 MED ORDER — VITAMIN C 500 MG PO TABS
1000.0000 mg | ORAL_TABLET | Freq: Three times a day (TID) | ORAL | Status: DC
  Administered 2015-03-28 – 2015-03-29 (×4): 1000 mg
  Filled 2015-03-28 (×4): qty 2

## 2015-03-28 MED ORDER — VITAL HIGH PROTEIN PO LIQD
1000.0000 mL | ORAL | Status: DC
  Administered 2015-03-28: 1000 mL
  Filled 2015-03-28 (×2): qty 1000

## 2015-03-28 MED ORDER — SELENIUM 50 MCG PO TABS
200.0000 ug | ORAL_TABLET | Freq: Every day | ORAL | Status: DC
  Administered 2015-03-29: 200 ug
  Filled 2015-03-28 (×2): qty 4

## 2015-03-28 NOTE — Progress Notes (Signed)
No acute events Tmax 101.8, VSS Intubated PERRL, sluggish Right hemiplegia Trace withdrawal on left Poor neurological condition Expect bad long term prognosis No role for surgery

## 2015-03-28 NOTE — Progress Notes (Signed)
Patient ID: [name], male   DOB: 11/29/1930, 81 y.o.   MRN: [id number] I met with his wife and son at the bedside and discussed his current condition. His wife reports he has a HX of severe BPH and has had a lot of difficulty with foley placements in the past - this likely explains the hematuria. They want to continue aggressive care for a few days and see how he does with further goals of care discussing pending that. We will respect their wishes. His wife thinks he is communicating with her through movements (?) and seems overly optimistic. Georganna Skeans, MD, MPH, FACS Trauma: [phone] General Surgery: [phone]

## 2015-03-28 NOTE — Progress Notes (Signed)
Noted pt to slightly withdrawal in all 4 extremities, GCS 6, pupils brisk. Per report, Pt localized in [place].  ~2000: Saw Dr. Cyndy Freeze, discussed this nurse assessment vs report/change of shift. MD stated, "pt Right side was always plegic and slight upward movement of left arm, pt has very poor prognosis". Instructed to continue to monitor.

## 2015-03-28 NOTE — Care Management Note (Signed)
Case Management Note  Patient Details  Name: [name] MRN: [id number] Date of Birth: [birth year]  Subjective/Objective:    Pt admitted on 03/25/2015 s/p fall with large ICH and Lt proximal humerus fracture.  PTA, pt independent, lived with spouse.                 Action/Plan: Pt currently sedated and on ventilator.  Will follow for discharge planning as pt progresses.    Expected Discharge Date:                  Expected Discharge Plan:     In-House Referral:     Discharge planning Services  CM Consult  Post Acute Care Choice:    Choice offered to:     DME Arranged:    DME Agency:     HH Arranged:    HH Agency:     Status of Service:  In process, will continue to follow  Medicare Important Message Given:    Date Medicare IM Given:    Medicare IM give by:    Date Additional Medicare IM Given:    Additional Medicare Important Message give by:     If discussed at [place] of Stay Meetings, dates discussed:    Additional Comments:  Reinaldo Raddle, RN, BSN  Trauma/Neuro ICU Case Manager [phone]

## 2015-03-28 NOTE — Progress Notes (Signed)
Serum Na 158; 3% NaCl turned off per order set for Na . 155; will continue to monitor.

## 2015-03-28 NOTE — Clinical Documentation Improvement (Signed)
Critical Care  Abnormal Lab/Test Results:    Component      Platelets  Latest Ref Rng      150 - 400 K/uL  04/13/2015      111 (L)  03/27/2015     4:01 AM 122 (L)  03/28/2015     9:00 AM PENDING    Possible Clinical Conditions associated with below indicators  Thrombocytopenia  Other Condition  Cannot Clinically Determine   Please exercise your independent, professional judgment when responding. A specific answer is not anticipated or expected. Please update your documentation within the medical record to reflect your response to this query.  Thank you, Mateo Flow, RN [phone] Clinical Documentation Specialist

## 2015-03-28 NOTE — Progress Notes (Signed)
Serum Na at 16:38 = 158; 3% NaCl remains off per order set for Na > 155.  Will continue to monitor.

## 2015-03-28 NOTE — Progress Notes (Signed)
Patient ID: [name], male   DOB: Mar 17, 1931, 81 y.o.   MRN: [id number] Follow up - Trauma Critical Care  Patient Details:    [name] is an 81 y.o. male.  Lines/tubes : Airway 7.5 mm (Active)  Secured at (cm) 28 cm 03/28/2015  4:41 AM  Measured From Lips 03/28/2015  4:41 AM  Secured Location Left 03/28/2015  4:41 AM  Secured By [employer] 03/28/2015  4:41 AM  Tube Holder Repositioned Yes 03/28/2015  4:41 AM  Cuff Pressure (cm H2O) 26 cm H2O 03/28/2015  4:41 AM  Site Condition Dry 03/28/2015  4:41 AM     CVC Triple Lumen 03/27/15 Right Subclavian (Active)  Indication for Insertion or Continuance of Line Administration of hyperosmolar/irritating solutions (i.e. TPN, Vancomycin, etc.) 03/28/2015  7:52 AM  Site Assessment Clean;Dry;Intact 03/27/2015  8:00 PM  Proximal Lumen Status Infusing 03/27/2015  8:00 PM  Medial Saline locked 03/27/2015  8:00 PM  Distal Lumen Status Saline locked 03/27/2015  8:00 PM  Dressing Type Transparent 03/27/2015  8:00 PM  Dressing Status Clean;Dry;Intact;Antimicrobial disc in place 03/27/2015  8:00 PM  Line Care Connections checked and tightened 03/27/2015  8:00 PM  Dressing Intervention New dressing 03/27/2015  4:00 AM  Dressing Change Due 04/03/15 03/27/2015  8:00 PM     NG/OG Tube Orogastric 18 Fr. Center mouth (Active)  Placement Verification Auscultation 03/27/2015  8:00 PM  Site Assessment Clean;Dry;Intact 03/27/2015  8:00 PM  Status Suction-low intermittent 03/27/2015  8:00 PM  Drainage Appearance Bile 03/27/2015  8:00 PM     Urethral Catheter Kandice Hams, RN Latex;Temperature probe 16 Fr. (Active)  Indication for Insertion or Continuance of Catheter Unstable critical patients (first 24-48 hours);Bladder outlet obstruction / other urologic reason;Other (comment) 03/28/2015  8:00 AM  Site Assessment Clean;Intact 03/27/2015  8:00 AM  Catheter Maintenance Bag below level of bladder;Catheter secured;Drainage bag/tubing not touching floor;Insertion date  on drainage bag;No dependent loops;Seal intact;Bag emptied prior to transport 03/28/2015  8:00 AM  Collection Container Standard drainage bag 03/27/2015  8:00 AM  Securement Method Securing device (Describe) 03/27/2015  8:00 AM  Urinary Catheter Interventions Unclamped 03/27/2015  8:00 AM  Output (mL) 45 mL 03/28/2015  5:00 AM    Microbiology/Sepsis markers: Results for orders placed or performed during the hospital encounter of 04/15/2015  MRSA PCR Screening     Status: None   Collection Time: 03/27/15  1:00 AM  Result Value Ref Range Status   MRSA by PCR NEGATIVE NEGATIVE Final    Comment:        The GeneXpert MRSA Assay (FDA approved for NASAL specimens only), is one component of a comprehensive MRSA colonization surveillance program. It is not intended to diagnose MRSA infection nor to guide or monitor treatment for MRSA infections.     Anti-infectives:  Anti-infectives    None      Best Practice/Protocols:  VTE Prophylaxis: Mechanical .  Consults: Treatment Team:  Kevan Ny Ditty, MD   Subjective:    Overnight Issues:   Objective:  Vital signs for last 24 hours: Temp:  [98.2 F (36.8 C)-101.8 F (38.8 C)] 99.5 F (37.5 C) (12/05 0800) Pulse Rate:  [77-108] 102 (12/05 0800) Resp:  [14-24] 22 (12/05 0800) BP: (86-138)/(51-72) 133/66 mmHg (12/05 0800) SpO2:  [100 %] 100 % (12/05 0800) FiO2 (%):  [40 %] 40 % (12/05 0441) Weight:  [89.6 kg (197 lb 8.5 oz)] 89.6 kg (197 lb 8.5 oz) (12/05 0300)  Hemodynamic parameters for last 24  hours:    Intake/Output from previous day: 12/04 0701 - 12/05 0700 In: 1698.8 [I.V.:1488.8; IV Piggyback:210] Out: 560 [Urine:560]  Intake/Output this shift:    Vent settings for last 24 hours: Vent Mode:  [-] PRVC FiO2 (%):  [40 %] 40 % Set Rate:  [14 bmp] 14 bmp Vt Set:  [640 mL] 640 mL PEEP:  [5 cmH20] 5 cmH20 Plateau Pressure:  [14 cmH20-19 cmH20] 19 cmH20  Physical Exam:  General: on vent Neuro: PERL 67mm, no gag  but WD to pain BLE, no response to passive movement LUE HEENT/Neck: ETT Resp: clear to auscultation bilaterally CVS: RRR GI: soft, NT, ND Extremities: sling LUE  Results for orders placed or performed during the hospital encounter of 04/09/2015 (from the past 24 hour(s))  Sodium     Status: None   Collection Time: 03/27/15 10:27 AM  Result Value Ref Range   Sodium 143 135 - 145 mmol/L  Sodium     Status: None   Collection Time: 03/27/15  3:30 PM  Result Value Ref Range   Sodium 145 135 - 145 mmol/L  Sodium     Status: Abnormal   Collection Time: 03/27/15 10:20 PM  Result Value Ref Range   Sodium 150 (H) 135 - 145 mmol/L  Sodium     Status: Abnormal   Collection Time: 03/28/15  3:36 AM  Result Value Ref Range   Sodium 153 (H) 135 - 145 mmol/L    Assessment & Plan: Present on Admission:  . ICH (intracerebral hemorrhage) ([place])   LOS: 1 day   Additional comments:I reviewed the patient's new clinical lab test results. and radiographic studies Fall Severe TBI/large L parietal ICC - Dr. Cyndy Freeze following and rec medical support only with very poor prognosis. I D/W him on the unit. 3% hypertonic saline at 75cc/hr, follow Na VDRF - weaning on 10/5, will not extubate in light of mental ststus L humerus FX/dislocation - sling for now, Dr. Marlou Sa following FEN - start TF, Na as above, check labs Dispo - ICU, goals of care discussion with family Critical Care Total Time*: 70 Minutes  Georganna Skeans, MD, MPH, FACS Trauma: [phone] General Surgery: [phone]  03/28/2015  *Care during the described time interval was provided by me. I have reviewed this patient's available data, including medical history, events of note, physical examination and test results as part of my evaluation.

## 2015-03-28 NOTE — Progress Notes (Signed)
Initial Nutrition Assessment  DOCUMENTATION CODES:   Non-severe (moderate) malnutrition in context of chronic illness  INTERVENTION:   Initiate Pivot 1.5 @ 20 ml/hr via OG tube and increase by 10 ml every 4 hours to goal rate of 60 ml/hr.   Tube feeding regimen provides 2160 kcal (95% of needs), 135 grams of protein, and 1092 ml of H2O.   NUTRITION DIAGNOSIS:   Malnutrition related to chronic illness as evidenced by moderate depletions of muscle mass, moderate depletion of body fat.  GOAL:   Patient will meet greater than or equal to 90% of their needs  MONITOR:   Vent status, Labs, Weight trends, TF tolerance  REASON FOR ASSESSMENT:   Consult Enteral/tube feeding initiation and management  ASSESSMENT:   Pt s/p fall with severe TBI, large parietal ICC, left humerus fx and dislocation.  Per trauma plan for vent support for a few more days however neuro prognosis is poor.   Patient is currently intubated on ventilator support MV: 13.2 L/min Temp (24hrs), Avg:100.2 F (37.9 C), Min:98.2 F (36.8 C), Max:101.8 F (38.8 C)  Medications reviewed and include: 3% NS, selenium and vitamin C Labs reviewed: sodium up to 158  OG tube Nutrition-Focused physical exam completed. Findings are mild/moderate fat depletion, mild/moderate muscle depletion, and mild edema.  Depletion noted, no family present for nutrition hx. Pt discussed during ICU rounds and with RN.   Diet Order:  Diet NPO time specified  Skin:  Reviewed, no issues  Last BM:  unknown  Height:   Ht Readings from Last 1 Encounters:  03/27/15 6\' 1"  (1.854 m)   Weight:   Wt Readings from Last 1 Encounters:  03/28/15 197 lb 8.5 oz (89.6 kg)   Ideal Body Weight:  83.6 kg  BMI:  Body mass index is 26.07 kg/(m^2).  Estimated Nutritional Needs:   Kcal:  2266  Protein:  115-130 grams  Fluid:  > 2.2 L/day  EDUCATION NEEDS:   No education needs identified at this time  [place], [place],  [place] Pager [phone] After Hours Pager

## 2015-03-29 ENCOUNTER — Inpatient Hospital Stay (HOSPITAL_COMMUNITY): Payer: Commercial Managed Care - HMO

## 2015-03-29 LAB — BASIC METABOLIC PANEL
BUN: 42 mg/dL — AB (ref 6–20)
CALCIUM: 9.2 mg/dL (ref 8.9–10.3)
CO2: 23 mmol/L (ref 22–32)
CREATININE: 1.6 mg/dL — AB (ref 0.61–1.24)
Chloride: >130 mmol/L (ref 101–111)
GFR calc Af Amer: 45 mL/min — ABNORMAL LOW (ref >60)
GFR calc non Af Amer: 38 mL/min — ABNORMAL LOW (ref >60)
Glucose, Bld: 183 mg/dL — ABNORMAL HIGH (ref 65–99)
Potassium: 4.1 mmol/L (ref 3.5–5.1)
Sodium: 161 mmol/L (ref 135–145)

## 2015-03-29 LAB — CBC
HEMATOCRIT: 34 % — AB (ref 39.0–52.0)
Hemoglobin: 10.9 g/dL — ABNORMAL LOW (ref 13.0–17.0)
MCH: 30.3 pg (ref 26.0–34.0)
MCHC: 32.1 g/dL (ref 30.0–36.0)
MCV: 94.4 fL (ref 78.0–100.0)
Platelets: 103 10*3/uL — ABNORMAL LOW (ref 150–400)
RBC: 3.6 MIL/uL — ABNORMAL LOW (ref 4.22–5.81)
RDW: 15 % (ref 11.5–15.5)
WBC: 18.2 10*3/uL — ABNORMAL HIGH (ref 4.0–10.5)

## 2015-03-29 LAB — GLUCOSE, CAPILLARY
GLUCOSE-CAPILLARY: 148 mg/dL — AB (ref 65–99)
GLUCOSE-CAPILLARY: 196 mg/dL — AB (ref 65–99)
Glucose-Capillary: 121 mg/dL — ABNORMAL HIGH (ref 65–99)
Glucose-Capillary: 158 mg/dL — ABNORMAL HIGH (ref 65–99)
Glucose-Capillary: 205 mg/dL — ABNORMAL HIGH (ref 65–99)

## 2015-03-29 LAB — SODIUM
SODIUM: 158 mmol/L — AB (ref 135–145)
SODIUM: 158 mmol/L — AB (ref 135–145)
SODIUM: 160 mmol/L — AB (ref 135–145)

## 2015-03-29 MED ORDER — MORPHINE BOLUS VIA INFUSION
5.0000 mg | INTRAVENOUS | Status: DC | PRN
  Filled 2015-03-29: qty 20

## 2015-03-29 MED ORDER — SODIUM CHLORIDE 0.9 % IV SOLN
10.0000 mg/h | INTRAVENOUS | Status: DC
  Filled 2015-03-29: qty 10

## 2015-03-29 MED ORDER — MIDAZOLAM BOLUS VIA INFUSION
5.0000 mg | INTRAVENOUS | Status: DC | PRN
  Filled 2015-03-29: qty 20

## 2015-03-29 MED ORDER — FENTANYL BOLUS VIA INFUSION
50.0000 ug | INTRAVENOUS | Status: DC | PRN
  Filled 2015-03-29: qty 200

## 2015-03-29 MED ORDER — FREE WATER
200.0000 mL | Freq: Four times a day (QID) | Status: DC
  Administered 2015-03-29 (×2): 200 mL

## 2015-03-29 MED ORDER — MORPHINE SULFATE 25 MG/ML IV SOLN
10.0000 mg/h | INTRAVENOUS | Status: DC
  Administered 2015-03-29: 10 mg/h via INTRAVENOUS
  Filled 2015-03-29: qty 10

## 2015-03-29 MED ORDER — SODIUM CHLORIDE 0.9 % IV BOLUS (SEPSIS)
250.0000 mL | Freq: Once | INTRAVENOUS | Status: AC
  Administered 2015-03-29: 250 mL via INTRAVENOUS

## 2015-03-29 MED ORDER — SODIUM CHLORIDE 0.9 % IV SOLN
100.0000 ug/h | INTRAVENOUS | Status: DC
  Filled 2015-03-29: qty 50

## 2015-03-29 MED ORDER — SODIUM CHLORIDE 0.9 % IV BOLUS (SEPSIS)
500.0000 mL | Freq: Once | INTRAVENOUS | Status: AC
  Administered 2015-03-29: 500 mL via INTRAVENOUS

## 2015-03-29 NOTE — Progress Notes (Signed)
   03/29/15 1831  Clinical Encounter Type  Visited With Health care provider;Patient not available  Visit Type Initial;Patient actively dying  Referral From Physician   Chaplain attempted to visit with the patient's family. Family member was asleep on the bed with patient. Chaplain support available as needed.   Jeri Lager, Chaplain 03/29/2015 6:32 PM

## 2015-03-29 NOTE — Procedures (Signed)
Extubation Procedure Note  Patient Details:   Name: [name] DOB: March 12, 1931 MRN: [id number]   Airway Documentation: Patient terminally extubated per family request, MD order.    Evaluation  O2 sats: currently acceptable Complications: No apparent complications Patient did tolerate procedure well. Bilateral Breath Sounds: Clear, Diminished Suctioning: Airway No  [name], Elwyn Lade 03/29/2015, 7:26 PM

## 2015-03-29 NOTE — Progress Notes (Signed)
Significant neurological worsening overnight Tmax 100.2, VSS Flaccid hemiplegia on right, dense paresis on right CT Head: enlargement of left frontoparietal hemorrhage Worsening neurological exam Expect continued neurological worsening No role for surgical intervention

## 2015-03-29 NOTE — Progress Notes (Signed)
Events noted in record Will follow in record Sling for now

## 2015-03-29 NOTE — Progress Notes (Signed)
CRITICAL VALUE ALERT  Critical value received:  Na: 161 and Cl>130  Date of notification:  03/29/2015  Time of notification:  0515  Critical value read back:Yes.    Nurse who received alert:  Lind Guest, RN  MD notified (1st page):  Kathlee Nations, RN from [facility] ; Instructed to call Trauma service  Time of first page:  0535  MD notified (2nd page): Dr. Donne Hazel   Time of second page: [phone]  Responding MD:  Dr. Donne Hazel  Time MD responded:  [phone]  Continue serial Na checks. Updated MD on neuro changes, interventions, and Dr. Saintclair Halsted coming to unit to have discussion with family. No further orders. Will continue to monitor closely.

## 2015-03-29 NOTE — Progress Notes (Signed)
Patient ID: [name], male   DOB: 1930/11/08, 81 y.o.   MRN: [id number] Tachycardia. Allergic to beta blockers. Will give fluid bolus. Family has stepped out. Georganna Skeans, MD, MPH, FACS Trauma: [phone] General Surgery: [phone]

## 2015-03-29 NOTE — Progress Notes (Signed)
~  0140: Radiologist called this RN after head CT and stated, " Ventricular trapping on right side, involving Right lateral ventricle. Shift increase to 2cm".  MD called, notified of this result and re-read Dr. Hewitt Shorts note for plan of care. MD stated he will read head scan, Will continue to monitor closely.

## 2015-03-29 NOTE — Progress Notes (Addendum)
Patient ID: [name], male   DOB: Sep 14, 1930, 81 y.o.   MRN: [id number] Follow up - Trauma Critical Care  Patient Details:    [name] is an 81 y.o. male.  Lines/tubes : Airway 7.5 mm (Active)  Secured at (cm) 28 cm 03/29/2015  3:20 AM  Measured From Lips 03/29/2015  3:20 AM  Secured Location Right 03/29/2015  3:20 AM  Secured By [employer] 03/29/2015  3:20 AM  Tube Holder Repositioned Yes 03/29/2015  3:20 AM  Cuff Pressure (cm H2O) 26 cm H2O 03/28/2015 11:25 PM  Site Condition Dry 03/28/2015  5:30 PM     CVC Triple Lumen 03/27/15 Right Subclavian (Active)  Indication for Insertion or Continuance of Line Administration of hyperosmolar/irritating solutions (i.e. TPN, Vancomycin, etc.) 03/29/2015  8:00 AM  Site Assessment Clean;Dry;Intact 03/28/2015  8:00 PM  Proximal Lumen Status Flushed;Saline locked 03/28/2015  8:00 PM  Medial Flushed;Saline locked 03/28/2015  8:00 PM  Distal Lumen Status Flushed;Saline locked 03/28/2015  8:00 PM  Dressing Type Transparent;Occlusive 03/28/2015  8:00 PM  Dressing Status Clean;Dry;Intact;Antimicrobial disc in place 03/28/2015  8:00 PM  Line Care Connections checked and tightened 03/28/2015  8:00 PM  Dressing Intervention New dressing 03/27/2015  4:00 AM  Dressing Change Due 04/03/15 03/28/2015  8:00 PM     NG/OG Tube Orogastric 18 Fr. Center mouth (Active)  Placement Verification Auscultation 03/28/2015  7:30 PM  Site Assessment Clean;Dry;Intact 03/28/2015  7:30 PM  Status Infusing tube feed 03/28/2015  7:30 PM  Drainage Appearance Bile 03/28/2015  7:30 PM     Urethral Catheter Kandice Hams, RN Latex;Temperature probe 16 Fr. (Active)  Indication for Insertion or Continuance of Catheter Unstable critical patients (first 24-48 hours) 03/29/2015  8:00 AM  Site Assessment Clean;Intact 03/28/2015  7:30 PM  Catheter Maintenance Bag below level of bladder;Catheter secured;Drainage bag/tubing not touching floor;Insertion date on drainage bag;No dependent  loops;Seal intact 03/29/2015  8:00 AM  Collection Container Standard drainage bag 03/28/2015  7:30 PM  Securement Method Securing device (Describe) 03/28/2015  7:30 PM  Urinary Catheter Interventions Unclamped 03/28/2015  7:30 PM  Output (mL) 80 mL 03/29/2015  8:00 AM    Microbiology/Sepsis markers: Results for orders placed or performed during the hospital encounter of 04/21/2015  MRSA PCR Screening     Status: None   Collection Time: 03/27/15  1:00 AM  Result Value Ref Range Status   MRSA by PCR NEGATIVE NEGATIVE Final    Comment:        The GeneXpert MRSA Assay (FDA approved for NASAL specimens only), is one component of a comprehensive MRSA colonization surveillance program. It is not intended to diagnose MRSA infection nor to guide or monitor treatment for MRSA infections.     Anti-infectives:  Anti-infectives    None      Best Practice/Protocols:  VTE Prophylaxis: Mechanical Intermittent Sedation  Consults: Treatment Team:  Kevan Ny Ditty, MD    Studies:CT head - IMPRESSION: 1. Increased size of left cerebral parenchymal hematoma with increased vasogenic edema and mass effect. Left-to-right shift is increased now measuring up to 2 cm with evidence of early ventricular trapping of the right lateral ventricle. 2. Scattered bilateral acute subarachnoid hemorrhage. New small volume intraventricular hemorrhage consistent with redistribution. Critical Value/emergent results were called by telephone at the time of interpretation on 03/29/2015 at 1:41 am to the critical care taking care of the patient Baldo Daub, who verbally acknowledged these results.  Subjective:    Overnight Issues: neuro status decline  lead to F/U CT head  Objective:  Vital signs for last 24 hours: Temp:  [99.3 F (37.4 C)-100.2 F (37.9 C)] 100.2 F (37.9 C) (12/06 0800) Pulse Rate:  [101-120] 114 (12/06 0800) Resp:  [20-29] 26 (12/06 0800) BP: (119-151)/(59-77) 145/70 mmHg (12/06  0800) SpO2:  [100 %] 100 % (12/06 0800) FiO2 (%):  [40 %] 40 % (12/06 0800) Weight:  [86.2 kg (190 lb 0.6 oz)] 86.2 kg (190 lb 0.6 oz) (12/06 0358)  Hemodynamic parameters for last 24 hours:    Intake/Output from previous day: 12/05 0701 - 12/06 0700 In: 1164.2 [I.V.:400; NG/GT:554.2; IV Piggyback:210] Out: 950 [Urine:950]  Intake/Output this shift: Total I/O In: 60 [NG/GT:60] Out: 80 [Urine:80]  Vent settings for last 24 hours: Vent Mode:  [-] PRVC FiO2 (%):  [40 %] 40 % Set Rate:  [14 bmp] 14 bmp Vt Set:  [640 mL] 640 mL PEEP:  [5 cmH20] 5 cmH20 Pressure Support:  [10 cmH20] 10 cmH20 Plateau Pressure:  [13 cmH20-18 cmH20] 17 cmH20  Physical Exam:  General: on vent Neuro: pupils 37mm, no gag, WD to pain LLE only HEENT/Neck: ETT Resp: clear to auscultation bilaterally CVS: rrr GI: soft, NT, ND, few BS Extremities: PAS  Results for orders placed or performed during the hospital encounter of 03/24/2015 (from the past 24 hour(s))  CBC     Status: Abnormal   Collection Time: 03/28/15  9:00 AM  Result Value Ref Range   WBC 16.2 (H) 4.0 - 10.5 K/uL   RBC 3.86 (L) 4.22 - 5.81 MIL/uL   Hemoglobin 11.4 (L) 13.0 - 17.0 g/dL   HCT 36.3 (L) 39.0 - 52.0 %   MCV 94.0 78.0 - 100.0 fL   MCH 29.5 26.0 - 34.0 pg   MCHC 31.4 30.0 - 36.0 g/dL   RDW 14.6 11.5 - 15.5 %   Platelets  150 - 400 K/uL    PLATELET CLUMPS NOTED ON SMEAR, COUNT APPEARS DECREASED  Basic metabolic panel     Status: Abnormal   Collection Time: 03/28/15  9:00 AM  Result Value Ref Range   Sodium 158 (H) 135 - 145 mmol/L   Potassium 4.5 3.5 - 5.1 mmol/L   Chloride 130 (H) 101 - 111 mmol/L   CO2 24 22 - 32 mmol/L   Glucose, Bld 160 (H) 65 - 99 mg/dL   BUN 32 (H) 6 - 20 mg/dL   Creatinine, Ser 1.52 (H) 0.61 - 1.24 mg/dL   Calcium 8.8 (L) 8.9 - 10.3 mg/dL   GFR calc non Af Amer 41 (L) >60 mL/min   GFR calc Af Amer 47 (L) >60 mL/min   Anion gap 4 (L) 5 - 15  Glucose, capillary     Status: Abnormal   Collection  Time: 03/28/15  4:28 PM  Result Value Ref Range   Glucose-Capillary 101 (H) 65 - 99 mg/dL   Comment 1 Notify RN   Sodium     Status: Abnormal   Collection Time: 03/28/15  4:38 PM  Result Value Ref Range   Sodium 158 (H) 135 - 145 mmol/L  Glucose, capillary     Status: Abnormal   Collection Time: 03/28/15  7:58 PM  Result Value Ref Range   Glucose-Capillary 135 (H) 65 - 99 mg/dL   Comment 1 Notify RN   Sodium     Status: Abnormal   Collection Time: 03/28/15 10:33 PM  Result Value Ref Range   Sodium 158 (H) 135 - 145 mmol/L  Glucose, capillary  Status: Abnormal   Collection Time: 03/28/15 11:37 PM  Result Value Ref Range   Glucose-Capillary 205 (H) 65 - 99 mg/dL   Comment 1 Notify RN   Glucose, capillary     Status: Abnormal   Collection Time: 03/29/15  3:37 AM  Result Value Ref Range   Glucose-Capillary 148 (H) 65 - 99 mg/dL   Comment 1 Notify RN   CBC     Status: Abnormal   Collection Time: 03/29/15  4:00 AM  Result Value Ref Range   WBC 18.2 (H) 4.0 - 10.5 K/uL   RBC 3.60 (L) 4.22 - 5.81 MIL/uL   Hemoglobin 10.9 (L) 13.0 - 17.0 g/dL   HCT 34.0 (L) 39.0 - 52.0 %   MCV 94.4 78.0 - 100.0 fL   MCH 30.3 26.0 - 34.0 pg   MCHC 32.1 30.0 - 36.0 g/dL   RDW 15.0 11.5 - 15.5 %   Platelets 103 (L) 150 - 400 K/uL  Basic metabolic panel     Status: Abnormal   Collection Time: 03/29/15  4:00 AM  Result Value Ref Range   Sodium 161 (HH) 135 - 145 mmol/L   Potassium 4.1 3.5 - 5.1 mmol/L   Chloride >130 (HH) 101 - 111 mmol/L   CO2 23 22 - 32 mmol/L   Glucose, Bld 183 (H) 65 - 99 mg/dL   BUN 42 (H) 6 - 20 mg/dL   Creatinine, Ser 1.60 (H) 0.61 - 1.24 mg/dL   Calcium 9.2 8.9 - 10.3 mg/dL   GFR calc non Af Amer 38 (L) >60 mL/min   GFR calc Af Amer 45 (L) >60 mL/min   Anion gap NOT CALCULATED 5 - 15  Glucose, capillary     Status: Abnormal   Collection Time: 03/29/15  8:05 AM  Result Value Ref Range   Glucose-Capillary 158 (H) 65 - 99 mg/dL    Assessment & Plan: Present on  Admission:  . ICH (intracerebral hemorrhage) ([place])   LOS: 2 days   Additional comments:I reviewed the patient's new clinical lab test results. and CT head Fall Severe TBI/large L parietal ICC - significant worsening on F/U CT head. Per Dr. Saintclair Halsted overnight - no opportunity for operative intervention and grave prognosis. Na up on hypertonic saline - add free water enterally.  VDRF - again this AM he is weaning on 10/5, will not extubate in light of mental ststus L humerus FX/dislocation - sling for now, Dr. Marlou Sa following Thrombocytopenia - mild, lower today. Monitor. FEN - start TF, Na as above Dispo - ICU, goals of care discussion with family again today Critical Care Total Time*: 16 Minutes  Georganna Skeans, MD, MPH, FACS Trauma: [phone] General Surgery: [phone]  03/29/2015  *Care during the described time interval was provided by me. I have reviewed this patient's available data, including medical history, events of note, physical examination and test results as part of my evaluation.

## 2015-03-29 NOTE — Progress Notes (Signed)
Patient ID: [name], male   DOB: 08/04/1930, 81 y.o.   MRN: [id number] Called to see patient regarding decline in mental status. Repeat CT scan shows extension of left frontoparietal intracerebral hemorrhage and contusion. Clinical exam shows pupils are 2 and mildly reactive he does have a weak gag he has no significant movement to noxious stimulation. CT scan shows large intraparenchymal contusion that actually appears to have either a dense amount of vasogenic edema or more likely patient has sustained a hemorrhagic CVA. Extensive midline shift possibly some trapping of the right temporal horn of the lateral ventricle. This exam and CT scan are not compatible with functional survival. I had an extensive conversation with the wife I did not recommend any additional intervention. I explained to her the poor prognosis and that we could not reverse this process. I recommended that we continue to support throughout the night but I think additional conversations need to be undertaken by her primary service with regard to withdraw support.

## 2015-03-29 NOTE — Progress Notes (Signed)
In to assess pt. Noted withdrawal in LLE only. No movement to painful stimuli in other extremities. Pupils continues to be 3+brisk.  MD called, notified. Discussed neuro assessment beginning of shift, current neuro assessment, and Dr. Hewitt Shorts note. Order for CT scan.

## 2015-03-29 NOTE — Progress Notes (Signed)
Patient transported on vent to CT and back to [id number] without complications.

## 2015-03-29 NOTE — Progress Notes (Signed)
Patient ID: NYKO GELL, male   DOB: May 25, 1930, 81 y.o.   MRN: [id number] I met with his son, wife, and other family members. They have decided to withdraw support. Will make DNR. Orders for extubation/morphine drip/versed drip done and await arrival of other family members this evening. Georganna Skeans, MD, MPH, FACS Trauma: [phone] General Surgery: [phone]

## 2015-03-30 ENCOUNTER — Encounter: Payer: Self-pay | Admitting: Gastroenterology

## 2015-04-24 NOTE — Clinical Documentation Improvement (Signed)
Neuro Surgery Orthopedic Trauma Critical Care   Registered Dietician documented 03/28/15 "non-severe (moderate) malnutrition in context of chronic illness".  Please document in your future progress note / discharge summary if you agree with moderate malnutrition as assessed by RD.     Moderate malnutrition in context of chronic illness  Malnutrition - other severity (please specify if mild or severe)  Other  Clinically Undetermined  Please exercise your independent, professional judgment when responding. A specific answer is not anticipated or expected.   Thank you, Mateo Flow, RN [phone] Clinical Documentation Specialist

## 2015-04-24 NOTE — Progress Notes (Signed)
Medical Examiner, Larey Dresser, notified. Family decide on [facility], [place], [place].

## 2015-04-24 NOTE — Progress Notes (Addendum)
~  0213:Noted rhythm change on monitor. Step-Son notified.  0215:Pt asystole. No heart or lung sounds auscultated by this RN and Therese Sarah, Therapist, sports.  Wife, Vaughan Basta, and sister-in-law in room. Daviyon Guise, notified.  0225: Dr. Ninfa Linden notified. 150cc of morphine wasted in sink, witnessed by Therese Sarah, RN

## 2015-04-24 NOTE — Discharge Summary (Signed)
Physician Discharge Summary  Patient ID: [name] MRN: [id number] DOB/AGE: 1930/11/23 82 y.o.  Admit date: 2015/04/03 Date of death: 04/07/15  Discharge Diagnoses Patient Active Problem List   Diagnosis Date Noted  . ICH (intracerebral hemorrhage) ([place]) 03/27/2015  . Abnormal nuclear stress test 02/04/2015  . Muscle spasm of left upper back 10/18/2014  . Constipation 05/10/2014  . Insomnia 04/07/2014  . GERD (gastroesophageal reflux disease) 04/07/2014  . Migraine without aura 04/07/2014  . History of abdominal aortic aneurysm (AAA) repair 02/24/2014  . Ruptured abdominal aortic aneurysm (AAA) ([place]) 02/24/2014  . Encounter for surgical follow-up care 02/24/2014  . Dysphagia 07/22/2013  . History of colonic polyps 07/22/2013  . HLD (hyperlipidemia) 01/22/2013  . Thrombocytopenia ([place]) 09/14/2012  . Calculus of gallbladder 09/14/2012  . Chronic systolic CHF (congestive heart failure)- EF 25-30%   . Degeneration macular 05/06/2012  . Abdominal aortic aneurysm (AAA) ([place]) 04/25/2012  . Headache, migraine 02/25/2012  . Chronic obstructive pulmonary disease ([place]) 12/27/2011  . BP (high blood pressure) 12/27/2011  . Calculus of kidney 12/27/2011  . AAA (abdominal aortic aneurysm) ([place]) 09/18/2011  . Abnormal cardiovascular function study 05/21/2011  . Lung nodule, solitary 10/27/2010  . Aortic regurgitation 07/24/2010  . Aortic valve defect 07/24/2010  . COPD (chronic obstructive pulmonary disease) ([place])   . LBBB (left bundle branch block) 12/20/2009  . Block, bundle branch, left 12/20/2009  . ERECTILE DYSFUNCTION 12/12/2009  . Primary pulmonary hypertension ([place]) 06/02/2007  . Pulmonary fibrosis ([place]) 04/24/2007  . 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 Other testicular hypofunction 02/11/2007  . Coronary atherosclerosis 10/09/2006  . Peripheral vascular disease ([place]) 10/09/2006  . BPH (benign prostatic hyperplasia) 10/09/2006  . Essential hypertension  10/08/2006  . CKD (chronic kidney disease), stage III 10/08/2006  . Disorder resulting from impaired renal function 10/08/2006  . Personal history of urinary calculi 10/08/2006    Consultants Dr. Suezanne Jacquet Ditty for neurosurgery  Dr. Corine Shelter for critical care medicine  Dr. Alphonzo Severance for orthopedic surgery   Procedures 12/4 -- Central venous catheter insertion by Dr. Therisa Doyne   HPI: [name] presented to emergency room after falling from level ground. He had gotten out of his car after a long trip and was walking toward his house when he lost his balance and fell backwards striking his left side and left head. He was brought to the emergency room and was not a trauma activation. His workup included a head CT which showed a large intracerebral hemorrhage and a fracture/dislocation of his left proximal humerus. His mental status declined and he was intubated in the emergency room. He was admitted by the trauma service and orthopedic surgery, critical care medicine, and neurosurgery were consulted.   Hospital Course: A central line was placed by critical care medicine and he was transferred to the ICU. Neurosurgery did not feel any surgical intervention was indicated and attempted to treat him medically. Orthopedic surgery recommended operative fixation but, in light of his devastating neurologic injury, felt it should wait until the patient was more stable and improving. Despite aggressive medical management his neurologic status remained at a very low level and CT scans of the head continued to show extension of his original injury. Neurosurgery did not think any meaningful recovery was possible. In light of this, the family decided to withdraw support and he expired as expected.   Signed: Lisette Abu, PA-C Pager: [phone] General Trauma PA Pager: [phone] 04/04/2015, 1:21 PM

## 2015-04-24 DEATH — deceased

## 2015-05-18 ENCOUNTER — Ambulatory Visit: Payer: Commercial Managed Care - HMO | Admitting: Cardiology

## 2016-07-07 IMAGING — NM NM MISC PROCEDURE
6 series · 36 of 36 positions shown · non-contrast
Comparison: none

[Series 1: wbr rest · 6.40mm/px · 6 of 64 frames shown]
[frame 6/64]
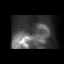
[frame 16/64]
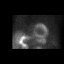
[frame 27/64]
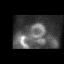
[frame 38/64]
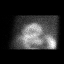
[frame 48/64]
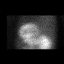
[frame 59/64]
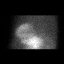

[Series 1: wbr_r-proj_st wbr rest · 6.40mm/px · 6 of 64 frames shown]
[frame 6/64]
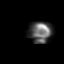
[frame 16/64]
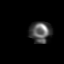
[frame 27/64]
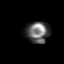
[frame 38/64]
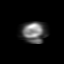
[frame 48/64]
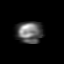
[frame 59/64]
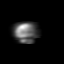

[Series 2: wbr_s-proj_st wbr stress-gsp · 6.40mm/px · 6 of 512 frames shown]
[frame 43/512]
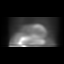
[frame 128/512]
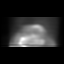
[frame 214/512]
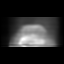
[frame 299/512]
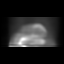
[frame 384/512]
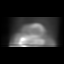
[frame 470/512]
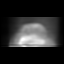

[Series 2: wbr stress-gsp · 6.40mm/px · 6 of 511 frames shown]
[frame 43/511  full-range]
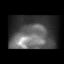
[frame 128/511  full-range]
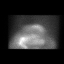
[frame 213/511  full-range]
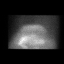
[frame 298/511  full-range]
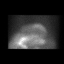
[frame 383/511  full-range]
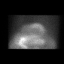
[frame 469/511  full-range]
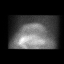

[Series 3: wbr_s-proj_st wbr stress-sum-em · 6.40mm/px · 6 of 64 frames shown]
[frame 6/64]
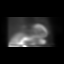
[frame 16/64]
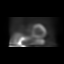
[frame 27/64]
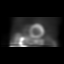
[frame 38/64]
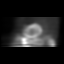
[frame 48/64]
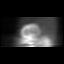
[frame 59/64]
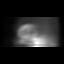

[Series 3: wbr stress-sum-em · 6.40mm/px · 6 of 64 frames shown]
[frame 6/64]
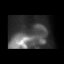
[frame 16/64]
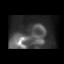
[frame 27/64]
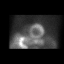
[frame 38/64]
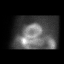
[frame 48/64]
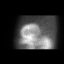
[frame 59/64]
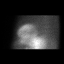

[36 of 36 positions shown; findings below may reference images not displayed]

Canned report from images found in remote index.

Refer to host system for actual result text.
